# Patient Record
Sex: Male | Born: 1950
Health system: Southern US, Community
[De-identification: ages and names within clinical notes are randomized; demographics above are authoritative.]

## PROBLEM LIST (undated history)

## (undated) DIAGNOSIS — R600 Localized edema: Secondary | ICD-10-CM

## (undated) DIAGNOSIS — C8203 Follicular lymphoma grade I, intra-abdominal lymph nodes: Secondary | ICD-10-CM

## (undated) DIAGNOSIS — C8590 Non-Hodgkin lymphoma, unspecified, unspecified site: Secondary | ICD-10-CM

## (undated) DIAGNOSIS — C649 Malignant neoplasm of unspecified kidney, except renal pelvis: Secondary | ICD-10-CM

## (undated) DIAGNOSIS — R972 Elevated prostate specific antigen [PSA]: Secondary | ICD-10-CM

## (undated) DIAGNOSIS — C8293 Follicular lymphoma, unspecified, intra-abdominal lymph nodes: Secondary | ICD-10-CM

## (undated) DIAGNOSIS — I10 Essential (primary) hypertension: Secondary | ICD-10-CM

## (undated) DIAGNOSIS — Z8572 Personal history of non-Hodgkin lymphomas: Secondary | ICD-10-CM

## (undated) DIAGNOSIS — C7900 Secondary malignant neoplasm of unspecified kidney and renal pelvis: Secondary | ICD-10-CM

## (undated) HISTORY — DX: Non-Hodgkin lymphoma, unspecified, unspecified site: C85.90

## (undated) HISTORY — PX: NEPHRECTOMY: SHX65

## (undated) HISTORY — DX: Follicular lymphoma grade i, intra-abdominal lymph nodes: C82.03

## (undated) HISTORY — PX: VENTRAL HERNIA REPAIR: SHX424

## (undated) HISTORY — DX: Localized edema: R60.0

## (undated) HISTORY — DX: Malignant neoplasm of unspecified kidney, except renal pelvis: C64.9

## (undated) HISTORY — PX: HERNIA REPAIR: SHX51

## (undated) HISTORY — DX: Personal history of non-Hodgkin lymphomas: Z85.72

## (undated) HISTORY — PX: APPENDECTOMY: SHX54

## (undated) HISTORY — DX: Secondary malignant neoplasm of unspecified kidney and renal pelvis: C79.00

## (undated) HISTORY — DX: Follicular lymphoma, unspecified, intra-abdominal lymph nodes: C82.93

## (undated) HISTORY — DX: Essential (primary) hypertension: I10

## (undated) HISTORY — DX: Elevated prostate specific antigen (PSA): R97.20

## (undated) HISTORY — PX: SHOULDER SURGERY: SHX246

## (undated) HISTORY — PX: GROIN MASS OPEN BIOPSY: SHX1714

---

## 1999-11-07 ENCOUNTER — Emergency Department (HOSPITAL_COMMUNITY): Admission: EM | Admit: 1999-11-07 | Discharge: 1999-11-07 | Payer: Self-pay | Admitting: Emergency Medicine

## 1999-11-07 ENCOUNTER — Encounter: Payer: Self-pay | Admitting: Emergency Medicine

## 2000-09-13 ENCOUNTER — Ambulatory Visit (HOSPITAL_COMMUNITY): Admission: RE | Admit: 2000-09-13 | Discharge: 2000-09-13 | Payer: Self-pay | Admitting: *Deleted

## 2002-01-14 ENCOUNTER — Encounter: Payer: Self-pay | Admitting: Internal Medicine

## 2002-01-14 ENCOUNTER — Inpatient Hospital Stay (HOSPITAL_COMMUNITY): Admission: EM | Admit: 2002-01-14 | Discharge: 2002-01-15 | Payer: Self-pay | Admitting: Internal Medicine

## 2002-01-19 ENCOUNTER — Inpatient Hospital Stay (HOSPITAL_COMMUNITY): Admission: RE | Admit: 2002-01-19 | Discharge: 2002-01-21 | Payer: Self-pay | Admitting: *Deleted

## 2002-01-19 ENCOUNTER — Encounter (INDEPENDENT_AMBULATORY_CARE_PROVIDER_SITE_OTHER): Payer: Self-pay | Admitting: Specialist

## 2002-01-20 ENCOUNTER — Encounter: Payer: Self-pay | Admitting: Oncology

## 2002-01-21 ENCOUNTER — Encounter: Payer: Self-pay | Admitting: Oncology

## 2002-01-26 ENCOUNTER — Inpatient Hospital Stay (HOSPITAL_COMMUNITY): Admission: AD | Admit: 2002-01-26 | Discharge: 2002-01-30 | Payer: Self-pay | Admitting: Oncology

## 2002-02-05 ENCOUNTER — Encounter: Payer: Self-pay | Admitting: Oncology

## 2002-02-05 ENCOUNTER — Inpatient Hospital Stay (HOSPITAL_COMMUNITY): Admission: EM | Admit: 2002-02-05 | Discharge: 2002-02-13 | Payer: Self-pay | Admitting: Oncology

## 2002-02-06 ENCOUNTER — Encounter: Payer: Self-pay | Admitting: Oncology

## 2002-02-08 ENCOUNTER — Encounter: Payer: Self-pay | Admitting: Surgery

## 2002-02-08 ENCOUNTER — Encounter: Payer: Self-pay | Admitting: Oncology

## 2002-02-12 ENCOUNTER — Encounter: Payer: Self-pay | Admitting: Oncology

## 2002-03-25 ENCOUNTER — Ambulatory Visit (HOSPITAL_COMMUNITY): Admission: RE | Admit: 2002-03-25 | Discharge: 2002-03-25 | Payer: Self-pay | Admitting: Oncology

## 2002-03-25 ENCOUNTER — Encounter: Payer: Self-pay | Admitting: Oncology

## 2002-06-02 ENCOUNTER — Ambulatory Visit (HOSPITAL_COMMUNITY): Admission: RE | Admit: 2002-06-02 | Discharge: 2002-06-02 | Payer: Self-pay | Admitting: Oncology

## 2002-06-02 ENCOUNTER — Encounter: Payer: Self-pay | Admitting: Oncology

## 2002-06-03 ENCOUNTER — Encounter: Payer: Self-pay | Admitting: Oncology

## 2002-06-03 ENCOUNTER — Ambulatory Visit (HOSPITAL_COMMUNITY): Admission: RE | Admit: 2002-06-03 | Discharge: 2002-06-03 | Payer: Self-pay | Admitting: Oncology

## 2002-08-03 ENCOUNTER — Ambulatory Visit (HOSPITAL_COMMUNITY): Admission: RE | Admit: 2002-08-03 | Discharge: 2002-08-03 | Payer: Self-pay | Admitting: Oncology

## 2002-08-03 ENCOUNTER — Encounter: Payer: Self-pay | Admitting: Oncology

## 2002-10-22 ENCOUNTER — Encounter: Payer: Self-pay | Admitting: Oncology

## 2002-10-22 ENCOUNTER — Ambulatory Visit (HOSPITAL_COMMUNITY): Admission: RE | Admit: 2002-10-22 | Discharge: 2002-10-22 | Payer: Self-pay | Admitting: Oncology

## 2002-10-23 ENCOUNTER — Ambulatory Visit (HOSPITAL_COMMUNITY): Admission: RE | Admit: 2002-10-23 | Discharge: 2002-10-23 | Payer: Self-pay | Admitting: Oncology

## 2002-10-23 ENCOUNTER — Encounter: Payer: Self-pay | Admitting: Oncology

## 2003-02-05 ENCOUNTER — Ambulatory Visit (HOSPITAL_COMMUNITY): Admission: RE | Admit: 2003-02-05 | Discharge: 2003-02-05 | Payer: Self-pay | Admitting: Oncology

## 2003-02-05 ENCOUNTER — Encounter: Payer: Self-pay | Admitting: Oncology

## 2003-02-09 ENCOUNTER — Ambulatory Visit (HOSPITAL_COMMUNITY): Admission: RE | Admit: 2003-02-09 | Discharge: 2003-02-09 | Payer: Self-pay | Admitting: Oncology

## 2003-02-09 ENCOUNTER — Encounter: Payer: Self-pay | Admitting: Oncology

## 2003-03-01 DIAGNOSIS — C649 Malignant neoplasm of unspecified kidney, except renal pelvis: Secondary | ICD-10-CM

## 2003-03-01 DIAGNOSIS — Z85528 Personal history of other malignant neoplasm of kidney: Secondary | ICD-10-CM | POA: Insufficient documentation

## 2003-03-01 HISTORY — DX: Malignant neoplasm of unspecified kidney, except renal pelvis: C64.9

## 2003-03-12 ENCOUNTER — Inpatient Hospital Stay (HOSPITAL_COMMUNITY): Admission: RE | Admit: 2003-03-12 | Discharge: 2003-03-15 | Payer: Self-pay | Admitting: *Deleted

## 2003-03-12 ENCOUNTER — Encounter (INDEPENDENT_AMBULATORY_CARE_PROVIDER_SITE_OTHER): Payer: Self-pay | Admitting: *Deleted

## 2003-03-12 ENCOUNTER — Encounter: Payer: Self-pay | Admitting: Urology

## 2003-06-17 ENCOUNTER — Encounter: Payer: Self-pay | Admitting: Oncology

## 2003-06-17 ENCOUNTER — Ambulatory Visit (HOSPITAL_COMMUNITY): Admission: RE | Admit: 2003-06-17 | Discharge: 2003-06-17 | Payer: Self-pay | Admitting: Oncology

## 2003-06-22 ENCOUNTER — Encounter: Payer: Self-pay | Admitting: Oncology

## 2003-06-22 ENCOUNTER — Ambulatory Visit (HOSPITAL_COMMUNITY): Admission: RE | Admit: 2003-06-22 | Discharge: 2003-06-22 | Payer: Self-pay | Admitting: Oncology

## 2003-11-23 ENCOUNTER — Ambulatory Visit (HOSPITAL_COMMUNITY): Admission: RE | Admit: 2003-11-23 | Discharge: 2003-11-23 | Payer: Self-pay | Admitting: Oncology

## 2003-11-24 ENCOUNTER — Ambulatory Visit (HOSPITAL_COMMUNITY): Admission: RE | Admit: 2003-11-24 | Discharge: 2003-11-24 | Payer: Self-pay | Admitting: Oncology

## 2004-05-16 ENCOUNTER — Ambulatory Visit (HOSPITAL_COMMUNITY): Admission: RE | Admit: 2004-05-16 | Discharge: 2004-05-16 | Payer: Self-pay | Admitting: Oncology

## 2004-05-17 ENCOUNTER — Ambulatory Visit (HOSPITAL_COMMUNITY): Admission: RE | Admit: 2004-05-17 | Discharge: 2004-05-17 | Payer: Self-pay | Admitting: Oncology

## 2004-09-19 ENCOUNTER — Ambulatory Visit (HOSPITAL_COMMUNITY): Admission: RE | Admit: 2004-09-19 | Discharge: 2004-09-19 | Payer: Self-pay | Admitting: Oncology

## 2004-09-20 ENCOUNTER — Ambulatory Visit (HOSPITAL_COMMUNITY): Admission: RE | Admit: 2004-09-20 | Discharge: 2004-09-20 | Payer: Self-pay | Admitting: Oncology

## 2005-03-13 ENCOUNTER — Ambulatory Visit (HOSPITAL_COMMUNITY): Admission: RE | Admit: 2005-03-13 | Discharge: 2005-03-13 | Payer: Self-pay | Admitting: Family Medicine

## 2005-03-19 ENCOUNTER — Ambulatory Visit (HOSPITAL_COMMUNITY): Admission: RE | Admit: 2005-03-19 | Discharge: 2005-03-19 | Payer: Self-pay | Admitting: Oncology

## 2005-03-30 ENCOUNTER — Ambulatory Visit: Payer: Self-pay | Admitting: Oncology

## 2005-09-28 ENCOUNTER — Ambulatory Visit: Payer: Self-pay | Admitting: Oncology

## 2005-10-02 ENCOUNTER — Ambulatory Visit (HOSPITAL_COMMUNITY): Admission: RE | Admit: 2005-10-02 | Discharge: 2005-10-02 | Payer: Self-pay | Admitting: Oncology

## 2006-02-26 HISTORY — PX: US ECHOCARDIOGRAPHY: HXRAD669

## 2006-04-09 ENCOUNTER — Ambulatory Visit: Payer: Self-pay | Admitting: Oncology

## 2006-04-10 LAB — COMPREHENSIVE METABOLIC PANEL
AST: 18 U/L (ref 0–37)
Alkaline Phosphatase: 51 U/L (ref 39–117)
BUN: 20 mg/dL (ref 6–23)
Calcium: 9.3 mg/dL (ref 8.4–10.5)
Chloride: 104 mEq/L (ref 96–112)
Creatinine, Ser: 1.4 mg/dL (ref 0.4–1.5)

## 2006-04-10 LAB — CBC WITH DIFFERENTIAL/PLATELET
BASO%: 0.1 % (ref 0.0–2.0)
EOS%: 3.4 % (ref 0.0–7.0)
HCT: 45.7 % (ref 38.7–49.9)
LYMPH%: 16.7 % (ref 14.0–48.0)
MCH: 30.4 pg (ref 28.0–33.4)
MCHC: 34.4 g/dL (ref 32.0–35.9)
MCV: 88.3 fL (ref 81.6–98.0)
NEUT%: 75.4 % — ABNORMAL HIGH (ref 40.0–75.0)
Platelets: 180 10*3/uL (ref 145–400)

## 2006-04-15 ENCOUNTER — Ambulatory Visit (HOSPITAL_COMMUNITY): Admission: RE | Admit: 2006-04-15 | Discharge: 2006-04-15 | Payer: Self-pay | Admitting: Oncology

## 2006-10-09 ENCOUNTER — Ambulatory Visit (HOSPITAL_COMMUNITY): Admission: RE | Admit: 2006-10-09 | Discharge: 2006-10-09 | Payer: Self-pay | Admitting: Oncology

## 2006-10-14 ENCOUNTER — Ambulatory Visit: Payer: Self-pay | Admitting: Oncology

## 2006-11-27 ENCOUNTER — Ambulatory Visit (HOSPITAL_BASED_OUTPATIENT_CLINIC_OR_DEPARTMENT_OTHER): Admission: RE | Admit: 2006-11-27 | Discharge: 2006-11-27 | Payer: Self-pay | Admitting: *Deleted

## 2006-12-26 ENCOUNTER — Ambulatory Visit: Payer: Self-pay | Admitting: Oncology

## 2006-12-31 DIAGNOSIS — C8203 Follicular lymphoma grade I, intra-abdominal lymph nodes: Secondary | ICD-10-CM

## 2006-12-31 HISTORY — DX: Follicular lymphoma grade i, intra-abdominal lymph nodes: C82.03

## 2007-01-01 LAB — CBC WITH DIFFERENTIAL/PLATELET
Basophils Absolute: 0 10*3/uL (ref 0.0–0.1)
EOS%: 1.7 % (ref 0.0–7.0)
HCT: 44.9 % (ref 38.7–49.9)
HGB: 15.1 g/dL (ref 13.0–17.1)
LYMPH%: 10 % — ABNORMAL LOW (ref 14.0–48.0)
MCH: 29.8 pg (ref 28.0–33.4)
MCV: 88.3 fL (ref 81.6–98.0)
MONO%: 4.4 % (ref 0.0–13.0)
NEUT%: 83.7 % — ABNORMAL HIGH (ref 40.0–75.0)

## 2007-01-02 LAB — COMPREHENSIVE METABOLIC PANEL
Alkaline Phosphatase: 68 U/L (ref 39–117)
BUN: 16 mg/dL (ref 6–23)
Glucose, Bld: 128 mg/dL — ABNORMAL HIGH (ref 70–99)
Total Bilirubin: 0.7 mg/dL (ref 0.3–1.2)

## 2007-01-02 LAB — LACTATE DEHYDROGENASE: LDH: 178 U/L (ref 94–250)

## 2007-01-03 ENCOUNTER — Ambulatory Visit (HOSPITAL_COMMUNITY): Admission: RE | Admit: 2007-01-03 | Discharge: 2007-01-03 | Payer: Self-pay | Admitting: Oncology

## 2007-04-25 ENCOUNTER — Ambulatory Visit: Payer: Self-pay | Admitting: Oncology

## 2007-04-30 LAB — CBC WITH DIFFERENTIAL/PLATELET
BASO%: 0.4 % (ref 0.0–2.0)
Basophils Absolute: 0 10*3/uL (ref 0.0–0.1)
HCT: 43.4 % (ref 38.7–49.9)
HGB: 15.4 g/dL (ref 13.0–17.1)
MCHC: 35.5 g/dL (ref 32.0–35.9)
MONO#: 0.3 10*3/uL (ref 0.1–0.9)
NEUT%: 76.2 % — ABNORMAL HIGH (ref 40.0–75.0)
RDW: 13.2 % (ref 11.2–14.6)
WBC: 5.9 10*3/uL (ref 4.0–10.0)
lymph#: 0.9 10*3/uL (ref 0.9–3.3)

## 2007-04-30 LAB — ERYTHROCYTE SEDIMENTATION RATE: Sed Rate: 2 mm/hr (ref 0–20)

## 2007-05-01 LAB — COMPREHENSIVE METABOLIC PANEL
ALT: 16 U/L (ref 0–53)
AST: 18 U/L (ref 0–37)
Calcium: 9.3 mg/dL (ref 8.4–10.5)
Chloride: 104 mEq/L (ref 96–112)
Creatinine, Ser: 1.11 mg/dL (ref 0.40–1.50)
Total Bilirubin: 0.5 mg/dL (ref 0.3–1.2)

## 2007-05-01 LAB — BETA 2 MICROGLOBULIN, SERUM: Beta-2 Microglobulin: 1.5 mg/L (ref 1.01–1.73)

## 2007-05-07 ENCOUNTER — Ambulatory Visit (HOSPITAL_COMMUNITY): Admission: RE | Admit: 2007-05-07 | Discharge: 2007-05-07 | Payer: Self-pay | Admitting: Oncology

## 2007-12-03 ENCOUNTER — Ambulatory Visit: Payer: Self-pay | Admitting: Oncology

## 2007-12-03 LAB — SEDIMENTATION RATE: Sed Rate: 2 mm/hr (ref 0–16)

## 2007-12-03 LAB — CBC WITH DIFFERENTIAL/PLATELET
Basophils Absolute: 0.1 10*3/uL (ref 0.0–0.1)
EOS%: 1.8 % (ref 0.0–7.0)
HCT: 44.1 % (ref 38.7–49.9)
HGB: 15.6 g/dL (ref 13.0–17.1)
LYMPH%: 14.4 % (ref 14.0–48.0)
MCH: 31 pg (ref 28.0–33.4)
MCV: 87.4 fL (ref 81.6–98.0)
MONO%: 4.4 % (ref 0.0–13.0)
NEUT%: 78.7 % — ABNORMAL HIGH (ref 40.0–75.0)

## 2007-12-04 ENCOUNTER — Ambulatory Visit (HOSPITAL_COMMUNITY): Admission: RE | Admit: 2007-12-04 | Discharge: 2007-12-04 | Payer: Self-pay | Admitting: Oncology

## 2007-12-04 LAB — COMPREHENSIVE METABOLIC PANEL
ALT: 31 U/L (ref 0–53)
AST: 26 U/L (ref 0–37)
BUN: 21 mg/dL (ref 6–23)
Calcium: 9.7 mg/dL (ref 8.4–10.5)
Chloride: 102 mEq/L (ref 96–112)
Creatinine, Ser: 1.22 mg/dL (ref 0.40–1.50)
Total Bilirubin: 0.7 mg/dL (ref 0.3–1.2)

## 2007-12-04 LAB — LACTATE DEHYDROGENASE: LDH: 192 U/L (ref 94–250)

## 2008-06-30 ENCOUNTER — Ambulatory Visit: Payer: Self-pay | Admitting: Oncology

## 2008-07-16 LAB — CBC WITH DIFFERENTIAL/PLATELET
BASO%: 0.3 % (ref 0.0–2.0)
HCT: 42.8 % (ref 38.7–49.9)
MCHC: 34.8 g/dL (ref 32.0–35.9)
MONO#: 0.3 10*3/uL (ref 0.1–0.9)
NEUT#: 5 10*3/uL (ref 1.5–6.5)
RBC: 4.91 10*6/uL (ref 4.20–5.71)
WBC: 6.2 10*3/uL (ref 4.0–10.0)
lymph#: 0.8 10*3/uL — ABNORMAL LOW (ref 0.9–3.3)

## 2008-07-19 LAB — COMPREHENSIVE METABOLIC PANEL
ALT: 15 U/L (ref 0–53)
AST: 18 U/L (ref 0–37)
Albumin: 4.5 g/dL (ref 3.5–5.2)
Calcium: 9.1 mg/dL (ref 8.4–10.5)
Chloride: 105 mEq/L (ref 96–112)
Potassium: 3.6 mEq/L (ref 3.5–5.3)
Total Protein: 6.2 g/dL (ref 6.0–8.3)

## 2008-07-19 LAB — BETA 2 MICROGLOBULIN, SERUM: Beta-2 Microglobulin: 1.52 mg/L (ref 1.01–1.73)

## 2008-07-20 ENCOUNTER — Ambulatory Visit (HOSPITAL_COMMUNITY): Admission: RE | Admit: 2008-07-20 | Discharge: 2008-07-20 | Payer: Self-pay | Admitting: Oncology

## 2008-10-11 ENCOUNTER — Encounter: Admission: RE | Admit: 2008-10-11 | Discharge: 2008-10-11 | Payer: Self-pay | Admitting: *Deleted

## 2008-10-19 HISTORY — PX: CARDIOVASCULAR STRESS TEST: SHX262

## 2008-11-05 ENCOUNTER — Ambulatory Visit: Payer: Self-pay | Admitting: Oncology

## 2008-11-09 LAB — COMPREHENSIVE METABOLIC PANEL
Albumin: 4.1 g/dL (ref 3.5–5.2)
BUN: 25 mg/dL — ABNORMAL HIGH (ref 6–23)
CO2: 24 mEq/L (ref 19–32)
Glucose, Bld: 97 mg/dL (ref 70–99)
Sodium: 140 mEq/L (ref 135–145)
Total Bilirubin: 0.5 mg/dL (ref 0.3–1.2)
Total Protein: 6.2 g/dL (ref 6.0–8.3)

## 2008-11-09 LAB — CBC WITH DIFFERENTIAL/PLATELET
Basophils Absolute: 0 10*3/uL (ref 0.0–0.1)
Eosinophils Absolute: 0.1 10*3/uL (ref 0.0–0.5)
HCT: 42.2 % (ref 38.7–49.9)
HGB: 14.7 g/dL (ref 13.0–17.1)
LYMPH%: 12.1 % — ABNORMAL LOW (ref 14.0–48.0)
MCV: 89 fL (ref 81.6–98.0)
MONO#: 0.4 10*3/uL (ref 0.1–0.9)
NEUT#: 4.8 10*3/uL (ref 1.5–6.5)
Platelets: 160 10*3/uL (ref 145–400)
RBC: 4.74 10*6/uL (ref 4.20–5.71)
WBC: 6.1 10*3/uL (ref 4.0–10.0)

## 2008-11-09 LAB — LACTATE DEHYDROGENASE: LDH: 147 U/L (ref 94–250)

## 2008-11-23 ENCOUNTER — Ambulatory Visit (HOSPITAL_COMMUNITY): Admission: RE | Admit: 2008-11-23 | Discharge: 2008-11-23 | Payer: Self-pay | Admitting: Oncology

## 2008-12-29 ENCOUNTER — Ambulatory Visit: Payer: Self-pay | Admitting: Oncology

## 2009-01-03 LAB — COMPREHENSIVE METABOLIC PANEL
ALT: 35 U/L (ref 0–53)
CO2: 27 mEq/L (ref 19–32)
Calcium: 9.1 mg/dL (ref 8.4–10.5)
Chloride: 103 mEq/L (ref 96–112)
Creatinine, Ser: 1.23 mg/dL (ref 0.40–1.50)
Glucose, Bld: 92 mg/dL (ref 70–99)
Total Bilirubin: 0.5 mg/dL (ref 0.3–1.2)
Total Protein: 6.2 g/dL (ref 6.0–8.3)

## 2009-01-03 LAB — LACTATE DEHYDROGENASE: LDH: 178 U/L (ref 94–250)

## 2009-01-03 LAB — CBC WITH DIFFERENTIAL/PLATELET
BASO%: 0.2 % (ref 0.0–2.0)
HCT: 44.4 % (ref 38.7–49.9)
MCHC: 34.3 g/dL (ref 32.0–35.9)
MONO#: 0.3 10*3/uL (ref 0.1–0.9)
NEUT#: 5.1 10*3/uL (ref 1.5–6.5)
NEUT%: 80.2 % — ABNORMAL HIGH (ref 40.0–75.0)
WBC: 6.4 10*3/uL (ref 4.0–10.0)
lymph#: 0.8 10*3/uL — ABNORMAL LOW (ref 0.9–3.3)

## 2009-01-03 LAB — MORPHOLOGY: RBC Comments: NORMAL

## 2009-01-03 LAB — URIC ACID: Uric Acid, Serum: 8.1 mg/dL — ABNORMAL HIGH (ref 4.0–7.8)

## 2009-05-06 ENCOUNTER — Ambulatory Visit: Payer: Self-pay | Admitting: Oncology

## 2009-05-10 ENCOUNTER — Ambulatory Visit (HOSPITAL_COMMUNITY): Admission: RE | Admit: 2009-05-10 | Discharge: 2009-05-10 | Payer: Self-pay | Admitting: Oncology

## 2009-05-10 LAB — CBC WITH DIFFERENTIAL/PLATELET
Eosinophils Absolute: 0.1 10*3/uL (ref 0.0–0.5)
LYMPH%: 14.9 % (ref 14.0–49.0)
MCH: 30.6 pg (ref 27.2–33.4)
MCHC: 34.5 g/dL (ref 32.0–36.0)
MCV: 88.8 fL (ref 79.3–98.0)
MONO%: 4.9 % (ref 0.0–14.0)
NEUT#: 4.7 10*3/uL (ref 1.5–6.5)
Platelets: 161 10*3/uL (ref 140–400)
RBC: 5.07 10*6/uL (ref 4.20–5.82)

## 2009-05-10 LAB — COMPREHENSIVE METABOLIC PANEL
AST: 26 U/L (ref 0–37)
Alkaline Phosphatase: 48 U/L (ref 39–117)
BUN: 14 mg/dL (ref 6–23)
Calcium: 9.3 mg/dL (ref 8.4–10.5)
Creatinine, Ser: 1.2 mg/dL (ref 0.40–1.50)
Total Bilirubin: 0.9 mg/dL (ref 0.3–1.2)

## 2009-05-10 LAB — MORPHOLOGY

## 2009-05-11 LAB — SEDIMENTATION RATE: Sed Rate: 2 mm/hr (ref 0–16)

## 2009-11-18 ENCOUNTER — Ambulatory Visit: Payer: Self-pay | Admitting: Oncology

## 2009-11-22 ENCOUNTER — Ambulatory Visit (HOSPITAL_COMMUNITY): Admission: RE | Admit: 2009-11-22 | Discharge: 2009-11-22 | Payer: Self-pay | Admitting: Oncology

## 2009-11-22 LAB — COMPREHENSIVE METABOLIC PANEL
ALT: 22 U/L (ref 0–53)
AST: 24 U/L (ref 0–37)
Albumin: 3.9 g/dL (ref 3.5–5.2)
Calcium: 9.2 mg/dL (ref 8.4–10.5)
Chloride: 102 mEq/L (ref 96–112)
Potassium: 3.5 mEq/L (ref 3.5–5.3)
Sodium: 141 mEq/L (ref 135–145)
Total Protein: 6.5 g/dL (ref 6.0–8.3)

## 2009-11-22 LAB — CBC WITH DIFFERENTIAL/PLATELET
Basophils Absolute: 0 10*3/uL (ref 0.0–0.1)
Eosinophils Absolute: 0.2 10*3/uL (ref 0.0–0.5)
HCT: 45.7 % (ref 38.4–49.9)
HGB: 15.7 g/dL (ref 13.0–17.1)
NEUT#: 5 10*3/uL (ref 1.5–6.5)
NEUT%: 82.1 % — ABNORMAL HIGH (ref 39.0–75.0)
RDW: 13.1 % (ref 11.0–14.6)
lymph#: 0.8 10*3/uL — ABNORMAL LOW (ref 0.9–3.3)

## 2009-11-22 LAB — MORPHOLOGY: PLT EST: ADEQUATE

## 2009-12-05 ENCOUNTER — Ambulatory Visit (HOSPITAL_COMMUNITY): Admission: RE | Admit: 2009-12-05 | Discharge: 2009-12-05 | Payer: Self-pay | Admitting: Urology

## 2010-01-06 ENCOUNTER — Inpatient Hospital Stay (HOSPITAL_COMMUNITY): Admission: RE | Admit: 2010-01-06 | Discharge: 2010-01-08 | Payer: Self-pay | Admitting: Urology

## 2010-01-06 ENCOUNTER — Encounter (INDEPENDENT_AMBULATORY_CARE_PROVIDER_SITE_OTHER): Payer: Self-pay | Admitting: Urology

## 2010-01-06 DIAGNOSIS — C7900 Secondary malignant neoplasm of unspecified kidney and renal pelvis: Secondary | ICD-10-CM

## 2010-01-06 HISTORY — DX: Secondary malignant neoplasm of unspecified kidney and renal pelvis: C79.00

## 2010-08-08 ENCOUNTER — Ambulatory Visit: Payer: Self-pay | Admitting: Cardiovascular Disease

## 2010-11-03 ENCOUNTER — Ambulatory Visit: Payer: Self-pay | Admitting: Oncology

## 2010-11-07 ENCOUNTER — Ambulatory Visit (HOSPITAL_COMMUNITY): Admission: RE | Admit: 2010-11-07 | Discharge: 2010-11-07 | Payer: Self-pay | Admitting: Oncology

## 2010-11-07 LAB — CBC WITH DIFFERENTIAL/PLATELET
BASO%: 0.3 % (ref 0.0–2.0)
Basophils Absolute: 0 10*3/uL (ref 0.0–0.1)
EOS%: 2.5 % (ref 0.0–7.0)
HGB: 16.2 g/dL (ref 13.0–17.1)
MCH: 30.6 pg (ref 27.2–33.4)
MCV: 88.8 fL (ref 79.3–98.0)
MONO%: 5 % (ref 0.0–14.0)
RBC: 5.3 10*6/uL (ref 4.20–5.82)
RDW: 13.4 % (ref 11.0–14.6)
lymph#: 0.9 10*3/uL (ref 0.9–3.3)

## 2010-11-07 LAB — COMPREHENSIVE METABOLIC PANEL
AST: 26 U/L (ref 0–37)
Albumin: 3.8 g/dL (ref 3.5–5.2)
Alkaline Phosphatase: 65 U/L (ref 39–117)
BUN: 13 mg/dL (ref 6–23)
Creatinine, Ser: 1.29 mg/dL (ref 0.40–1.50)
Glucose, Bld: 100 mg/dL — ABNORMAL HIGH (ref 70–99)
Total Bilirubin: 0.7 mg/dL (ref 0.3–1.2)

## 2010-11-07 LAB — MORPHOLOGY: RBC Comments: NORMAL

## 2010-11-08 LAB — SEDIMENTATION RATE: Sed Rate: 2 mm/h (ref 0–16)

## 2011-01-11 ENCOUNTER — Ambulatory Visit (HOSPITAL_COMMUNITY)
Admission: RE | Admit: 2011-01-11 | Discharge: 2011-01-11 | Payer: Self-pay | Source: Home / Self Care | Attending: Orthopedic Surgery | Admitting: Orthopedic Surgery

## 2011-01-20 ENCOUNTER — Other Ambulatory Visit: Payer: Self-pay | Admitting: Oncology

## 2011-01-20 ENCOUNTER — Encounter: Payer: Self-pay | Admitting: Oncology

## 2011-01-20 DIAGNOSIS — C8590 Non-Hodgkin lymphoma, unspecified, unspecified site: Secondary | ICD-10-CM

## 2011-01-21 ENCOUNTER — Encounter: Payer: Self-pay | Admitting: Oncology

## 2011-03-18 LAB — COMPREHENSIVE METABOLIC PANEL
ALT: 29 U/L (ref 0–53)
AST: 27 U/L (ref 0–37)
Albumin: 4 g/dL (ref 3.5–5.2)
Alkaline Phosphatase: 66 U/L (ref 39–117)
BUN: 13 mg/dL (ref 6–23)
CO2: 32 mEq/L (ref 19–32)
Calcium: 9.2 mg/dL (ref 8.4–10.5)
Chloride: 101 mEq/L (ref 96–112)
Creatinine, Ser: 1.1 mg/dL (ref 0.4–1.5)
GFR calc Af Amer: >60 mL/min (ref >60)
GFR calc non Af Amer: >60 mL/min (ref >60)
Glucose, Bld: 107 mg/dL — ABNORMAL HIGH (ref 70–99)
Potassium: 3.6 mEq/L (ref 3.5–5.1)
Sodium: 141 mEq/L (ref 135–145)
Total Bilirubin: 0.8 mg/dL (ref 0.3–1.2)
Total Protein: 6.7 g/dL (ref 6.0–8.3)

## 2011-03-18 LAB — TYPE AND SCREEN
ABO/RH(D): O NEG
Antibody Screen: NEGATIVE

## 2011-03-18 LAB — CBC
HCT: 47.3 % (ref 39.0–52.0)
Hemoglobin: 15.9 g/dL (ref 13.0–17.0)
MCHC: 33.7 g/dL (ref 30.0–36.0)
MCV: 89.6 fL (ref 78.0–100.0)
Platelets: 173 10*3/uL (ref 150–400)
RBC: 5.28 MIL/uL (ref 4.22–5.81)
RDW: 13.6 % (ref 11.5–15.5)
WBC: 5.6 10*3/uL (ref 4.0–10.5)

## 2011-03-18 LAB — PROTIME-INR
INR: 0.91 (ref 0.00–1.49)
Prothrombin Time: 12.2 seconds (ref 11.6–15.2)

## 2011-03-18 LAB — APTT: aPTT: 28 seconds (ref 24–37)

## 2011-03-18 LAB — ABO/RH: ABO/RH(D): O NEG

## 2011-04-05 ENCOUNTER — Other Ambulatory Visit: Payer: Self-pay | Admitting: Cardiovascular Disease

## 2011-04-05 DIAGNOSIS — I10 Essential (primary) hypertension: Secondary | ICD-10-CM

## 2011-04-05 NOTE — Telephone Encounter (Signed)
Fax received from pharmacy. Jodette Akeylah Hendel RN  

## 2011-04-26 ENCOUNTER — Encounter: Payer: Self-pay | Admitting: Family Medicine

## 2011-05-18 NOTE — H&P (Signed)
Brian Parker, Brian Parker                            ACCOUNT NO.:  [id number]   MEDICAL RECORD NO.:  [id number]                   PATIENT TYPE:  INP   LOCATION:  0346                                 FACILITY:  [facility]   PHYSICIAN:  Mark C. Vernie Ammons, M.D.               DATE OF BIRTH:  07-Jan-1950   DATE OF ADMISSION:  03/12/2003  DATE OF DISCHARGE:                                HISTORY & PHYSICAL   HISTORY OF PRESENT ILLNESS:  The patient is a 61 year old white male who was  seen in office consultation recently after Dr. Luan Pulling had obtained a CT  scan of the abdomen and pelvis.  This study revealed an area in the right  kidney that appeared to have changed from previous CT scan.  The patient had  complained of feeling like he had pulled a muscle in his right flank but  does a lot of physical labor.  He also has a history of a non-Hodgkin  lymphoma and because of that has been getting serial CT scans since its  diagnosis.  That has allowed continued observation of his kidneys and upon  review of these scans there was a small cyst seen in his right kidney.  This  was confirmed by ultrasound but was noted to be in the medial aspect of the  kidney.  That was done in October of last year.  A CT scan done on February 09, 2003 clearly demonstrated what appeared to be a solid mass projecting  off the posterior aspect of the right kidney measuring 1.5 x 1.4 cm.  The  kidney itself appeared to have a somewhat thin parenchyma and a normal  contralateral kidney that appeared to be slightly increased in size with  good function.  In retrospect there was a subtle change to his kidney in his  October scan but not enough to be noticeable unless one was to review the  most recent scan which has demonstrated definite enlargement.  I confirmed  the solid nature of the lesion and its presence by an ultrasound in my  office at that time.  The patient is now admitted for elective right partial  nephrectomy and  also he is to undergo repair of a ventral hernia under the  same anesthetic.   PAST MEDICAL HISTORY:  1. Hypertension.  2. Gastroesophageal reflux disease.  3. Non-Hodgkin lymphoma.   PAST SURGICAL HISTORY:  1. Shoulder surgery in 1970.  2. Appendectomy in 1995.  3. Abdominal lymph node biopsy in 2003.  4. Required exploration last year for a bowel obstruction.   CURRENT MEDICATIONS:  1. Norvasc 5 mg.  2. Flomax 20 mg daily.  3. Allopurinol 10 mg a day.   ALLERGIES:  NKDA.   SOCIAL HISTORY:  He denies tobacco use and occasionally drinks alcohol.   FAMILY HISTORY:  Father is 100, mother is 40, and there is hypertension in  the family.   REVIEW OF SYSTEMS:  Reveals no bone pain or unexplained weight loss.  He has  nocturia one time at night and sometimes twice.  Otherwise no irritative or  obstructive voiding symptoms.  No gross hematuria has been noted.  His most  recent CT scan revealed no evidence of recurrence of his lymphoma.   PHYSICAL EXAMINATION:  VITAL SIGNS:  Temperature 98, pulse 56 and regular,  respirations 18, blood pressure 120/80.  GENERAL:  The patient is a well-developed, well-nourished white male in no  apparent distress.  HEENT:  Atraumatic, normocephalic.  Oropharynx clear.  NECK:  Supple without mass or JVD.  LUNGS:  Clear to auscultation.  CARDIOVASCULAR:  Bradycardic rhythm but regular.  No murmurs.  ABDOMEN:  Soft and nontender.  There is a midline abdominal incisional  hernia defect approximately 10 cm in the area of the umbilicus.  It is  nontender and reducible.  No hepatosplenomegaly is appreciated.  LYMPHATICS:  The inguinal regions reveal no adenopathy.  He has a right  inguinal bulge that is a little bit larger than on the left hand side.   How about just cancelling all of this, thanks.                                               Mark C. Vernie Ammons, M.D.    MCO/MEDQ  D:  03/12/2003  T:  03/12/2003  Job:  [id number]   cc:   Vikki Ports, M.D.  [address]  [place]  [place] [address]  Fax: [phone]   Pierce Crane, M.D.  [address] [facility]  [place]  [place] [address]  Fax: [phone]   Maryla Morrow. Modesto Charon, M.D.  [address]  [place]  [place] [address]  Fax: [phone]

## 2011-05-18 NOTE — Op Note (Signed)
NAMEMAXSON, ODDO NO.:  [id number]   MEDICAL RECORD NO.:  [id number]          PATIENT TYPE:  AMB   LOCATION:  NESC                         FACILITY:  [facility]   PHYSICIAN:  Alfonse Ras, MD   DATE OF BIRTH:  [birth year]   DATE OF PROCEDURE:  11/27/2006  DATE OF DISCHARGE:                               OPERATIVE REPORT   PREOPERATIVE DIAGNOSIS:  Bilateral inguinal hernias, history of  lymphoma, history of ventral hernia, history of renal cancer.   POSTOPERATIVE DIAGNOSIS:  Bilateral inguinal hernias, history of  lymphoma, history of ventral hernia, history of renal cancer.   PROCEDURES:  Bilateral open inguinal hernia repairs with mesh.   SURGEON:  Alfonse Ras, MD   ANESTHESIA:  General.   DESCRIPTION:  The patient was taken to the operating room, placed in  supine position.  After adequate anesthesia was induced using  endotracheal tube, the lower abdomen and perineum were prepped and  draped in normal sterile fashion.  Starting on the right side I made an  oblique incision extending from the anterior superior iliac spine down  toward the pubic tubercle.  I dissected down sharply and bluntly down to  the external oblique fascia.  This was opened along its fibers.  Ilioinguinal nerve was identified and retracted superiorly.  Dissection  was taken down to Cooper's ligament and shelving edge of the inguinal  ligament and down to the transversalis fascia.  Spermatic cord was  identified and grasped with the Penrose drain down near the external  ring.  A direct hernia defect was identified and reduced into the  abdominal cavity.  Primary closure was performed in a tension-free  manner using interrupted #1 Novofil approximating transversalis fascia  to the shelving edge of the inguinal ligament and down to Cooper's  ligament.  This was brought up to the internal ring.  A piece of onlay  Bard polypropylene mesh was then placed over the repair and tacked  using  a running 2-0 Prolene suture from the pubic tubercle and out lateral to  the internal ring.  Adequate hemostasis was assured.  The muscles were  injected using 0.5 Marcaine.  The external oblique fascia was closed  with a running 3-0 Vicryl suture.  Skin incision was closed with  staples.   I then turned my attention to the left side.  Again an oblique incision  was made in similar fashion and dissected down the external oblique  fascia using Bovie electrocautery.  This was opened along its fibers.  Ilioinguinal nerve was identified and retracted superiorly.  Spermatic  cord was surrounded with Penrose drain at the external ring.  Direct  hernia defect which was smaller than it was on the right was reduced  into the abdominal cavity.  The floor of Hesselbach's triangle was then  closed with interrupted 0 Surgilon sutures.  Again a piece of  polypropylene Bard  mesh was placed over the repair and tacked with a running 2-0 Prolene  suture and brought out lateral to the internal ring.  The tissues were  injected with 0.5  Marcaine.  The fascia was closed with running 3-0  Vicryl.  The skin incision was closed with staples.  The patient  tolerated the procedure well and went to PACU in good condition.      Alfonse Ras, MD  Electronically Signed     KRE/MEDQ  D:  11/27/2006  T:  11/27/2006  Job:  [id number]

## 2011-05-18 NOTE — Discharge Summary (Signed)
NAMEBARTLETT, ENKE                            ACCOUNT NO.:  [id number]   MEDICAL RECORD NO.:  [id number]                   PATIENT TYPE:  INP   LOCATION:  0346                                 FACILITY:  [facility]   PHYSICIAN:  Mark C. Vernie Ammons, M.D.               DATE OF BIRTH:  [birth year]   DATE OF ADMISSION:  03/12/2003  DATE OF DISCHARGE:  03/15/2003                                 DISCHARGE SUMMARY   ADMISSION DIAGNOSES:  1. Right renal mass.  2. Ventral hernia.   POSTOPERATIVE DIAGNOSES:  1. Right renal mass.  2. Ventral hernia.   PROCEDURES:  1. Right partial nephrectomy.  2. Laparoscopic ventral hernia repair.   HISTORY AND PHYSICAL:  For full details please see admission History and  Physical.  Briefly, the patient is a 61 year old white male with a ventral  hernia status post previous abdominal surgery.  The patient also has  recently been found to have a small right renal mass that was enhancing and  consistent with malignancy and measuring approximately 1-2 cm in diameter.  After urologic and general surgical evaluation, it was decided to proceed  with ventral hernia repair as well as right partial nephrectomy.   HOSPITAL COURSE:  The patient was taken to the operating room on March 12, 2003 and underwent the above procedures.  He tolerated these well and  without complication.  Postoperatively, the patient was able to be  transferred to a regular hospital room following recovery from anesthesia.  The patient was gradually able to resume ambulating without difficulty.  His  diet was gradually advanced as his bowel function returned.  The patient's  pain medication was gradually able to be changed to oral medications once  his diet was advanced.  By postoperative day #3, the patient was tolerating  a regular diet and was ambulating without difficulty.  He had had a bowel  movement and was able to be discharged home.  His wounds looked good without  evidence of  infection throughout his hospital course.   DISPOSITION:  Home.   DISCHARGE MEDICATIONS:  The patient was instructed to resume his regular  home medications.  In addition, he was given a prescription for Tylox to  take as needed for pain.   DISCHARGE INSTRUCTIONS:  The patient was instructed to resume his diet as  before the surgery.  In addition, he was instructed to be ambulatory.  However, he was instructed to subsequently refrain from any heavy lifting,  strenuous activity, or driving.    FOLLOW UP:  An appointment was made for the patient to follow up in  approximately one week for removal of his staples.     Crecencio Mc, M.D.                          Veverly Fells. Vernie Ammons, M.D.    LB/MEDQ  D:  03/16/2003  T:  03/17/2003  Job:  [id number]

## 2011-05-18 NOTE — Consult Note (Signed)
[place]  Patient:    JUDEA, RICHES Visit Number: [id number] MRN: [id number]          Service Type: MED Location: [phone] Attending Physician:  Miguel Aschoff Dictated by:   Lonna Cobb, N.P. Proc. Date: 01/15/02 Admit Date:  01/14/2002 Discharge Date: 01/15/2002   CC:         Redmond Baseman, M.D.  Miguel Aschoff, M.D.  Catalina Lunger, M.D.   Consultation Report  REASON FOR CONSULTATION:  Retroperitoneal and mesenteric masses.  REFERRING PHYSICIAN:  Miguel Aschoff, M.D.  HISTORY OF PRESENT ILLNESS:  Mr. Zenon is a 61 year old man with a history of hypertension who presented to his primary care [facility] on January 14, 2002, with complaints of an approximately two-week history of abdominal pain and more recent "hard" area in his left abdominal region.  He reports the pain is worse at night.  Abdominal/pelvic CT is at [facility] on January 14, 2002, showed large retroperitoneal masses in periaortic and pericaval regions with displacement of the right kidney laterally and encasement of the renal vessels, as well as a large mass involving the mesentery.  The patient has been evaluated by Catalina Lunger, M.D. with plans for laparotomy and biopsy next week.  PAST MEDICAL HISTORY: 1. Hypertension. 2. Cardiac catheterization approximately 18 months ago which was negative. 3. Status post appendectomy in 1995. 4. Status post left shoulder surgery in 1969.  MEDICATIONS:  Toprol XL 25 mg daily.  HOME MEDICATIONS:  Monopril.  ALLERGIES:  No known drug allergies.  FAMILY HISTORY:  Mother is living and has a history of cervical cancer for which she was treated with radiation.  She also has a history of IBS.  Father has a history of hypertension, diabetes mellitus, and an MI.  Sister who has hypertension.  One brother and one sister who are both healthy.  SOCIAL HISTORY:  Mr. Vanessen lives in [place]  with his wife.  They have two children ages 64 and 5 who are both healthy.  They own a blind/drapery company. He has no history of EtOH or tobacco use.  REVIEW OF SYSTEMS:  The patient denies any weight loss or anorexia.  He has had no fever or nightsweats.  He denies any fatigue.  He does report an approximately two-week history of abdominal pain/pressure worse at night.  He denies any unusual headaches or vision changes.  He has had no mouth sores. He denies any enlarged lymph nodes.  He has had no shortness of breath.  He has had a recent cough which he attributes to a cold.  He denies any hemoptysis. He has had no chest pain.  He denies any peripheral edema.  He has had no recent change in his bowel habits and denies any rectal bleeding.  He has had no nausea and vomiting.  He has been experiencing intermittent "indigestion" since October of 2002.  He denies any hematuria or dysuria.  No testicular pain or masses.  He denies any skin changes or rashes.  He denies any falls or balance problems.  PHYSICAL EXAMINATION:  VITAL SIGNS:  Temperature 98.7, heart rate 60, respirations 18, blood pressure 133/86, oxygen saturation 95% on room air.  GENERAL:  Well-nourished, Caucasian male in no acute distress.  HEENT:  Normocephalic, atraumatic.  Pupils are equal, round and reactive to light; extraocular movements are intact.  Sclerae anicteric.  Oropharynx is clear; there are no ulcers or lesions in the oral cavity.  LYMPHS:  No palpable cervical, supraclavicular, axillary, or inguinal lymph nodes.  CHEST:  Lungs clear bilaterally.  HEART:  Regular rate and rhythm.  ABDOMEN:  Palpable mass in the left midabdominal region.  EXTREMITIES:  No clubbing, cyanosis, or edema.  NEUROLOGICAL:  Alert and oriented x 3.  Motor strength is 5/5.  DTRs are 2+ and symmetrical.  GENITOURINARY:  No testicular masses.  LABORATORY DATA:  Hemoglobin 14.2, white count 10.3, platelets 213,000. Sodium  142, potassium 3.8, BUN 12, creatinine 1.4, glucose 91, calcium 8.1, total bilirubin 1.0, alkaline phosphatase 65, SGOT 25, SGPT 22, total protein 6.7, albumin 3.8.  PTT 27, PT 13.2.  LDH 150, ESR 1, HIV negative, hepatitis B negative.  RADIOLOGY:  Abdominal CT; large retroperitoneal masses in periaortic and pericaval regions.  Displacement of the right kidney laterally and virtual encasement of renal vessels by retroperitoneal masses.  There is also a large mass involving the mesentery measuring approximately 12 cm.  Pelvic CT; bilateral iliac adenopathy.  IMPRESSION:  Mr. Bigley is a 61 year old previously healthy man who presented with complaints of mild abdominal discomfort and was found to have large retroperitoneal/mesenteric masses.  He has had no other symptoms and specifically denies any nightsweats, fevers, or weight loss.  Examination is notable for a large palpable approximately 15 to 20 cm mass in the left abdominal region with no peripheral adenopathy.  We agree with the biopsy scheduled for next week.  Differential diagnosis includes lymphoma, retroperitoneal sarcoma, and germcell cancer.  PLAN:  We will complete staging with CT scan of the chest, CAT scan, bone marrow biopsy, and a MUGA scan.  The patient is seen and examined by Dr. Donnie Coffin; x-rays were reviewed. Dictated by:   Lonna Cobb, N.P. Attending Physician:  Miguel Aschoff DD:  01/16/02 TD:  01/18/02 Job: [id number]

## 2011-08-13 ENCOUNTER — Other Ambulatory Visit: Payer: Self-pay | Admitting: *Deleted

## 2011-08-13 MED ORDER — HYDROCHLOROTHIAZIDE 25 MG PO TABS: 25.0000 mg | ORAL_TABLET | Freq: Every day | ORAL | Status: DC

## 2011-08-13 NOTE — Telephone Encounter (Signed)
Fax received from pharmacy. Refill completed. Informed needs yearly, will call back and make app.Alfonso Ramus RN

## 2011-09-28 LAB — GLUCOSE, CAPILLARY: Glucose-Capillary: 93

## 2011-11-07 ENCOUNTER — Encounter: Payer: Self-pay | Admitting: Cardiovascular Disease

## 2011-11-09 ENCOUNTER — Encounter: Payer: Self-pay | Admitting: Cardiovascular Disease

## 2011-11-09 ENCOUNTER — Ambulatory Visit (INDEPENDENT_AMBULATORY_CARE_PROVIDER_SITE_OTHER): Payer: BC Managed Care – PPO | Admitting: Cardiovascular Disease

## 2011-11-09 VITALS — BP 156/93 | HR 61 | Ht 70.0 in | Wt 210.4 lb

## 2011-11-09 DIAGNOSIS — I10 Essential (primary) hypertension: Secondary | ICD-10-CM

## 2011-11-09 LAB — LIPID PANEL
LDL Cholesterol: 98 mg/dL (ref 0–99)
Total CHOL/HDL Ratio: 3
Triglycerides: 79 mg/dL (ref 0.0–149.0)

## 2011-11-09 LAB — HEPATIC FUNCTION PANEL
AST: 22 U/L (ref 0–37)
Albumin: 4.2 g/dL (ref 3.5–5.2)
Alkaline Phosphatase: 52 U/L (ref 39–117)
Total Bilirubin: 1 mg/dL (ref 0.3–1.2)

## 2011-11-09 LAB — BASIC METABOLIC PANEL
CO2: 26 mEq/L (ref 19–32)
Calcium: 9.2 mg/dL (ref 8.4–10.5)
Chloride: 103 mEq/L (ref 96–112)
Creatinine, Ser: 1.1 mg/dL (ref 0.4–1.5)
Glucose, Bld: 89 mg/dL (ref 70–99)

## 2011-11-09 MED ORDER — DOXAZOSIN MESYLATE 4 MG PO TABS: 4.0000 mg | ORAL_TABLET | Freq: Every day | ORAL | Status: DC

## 2011-11-09 NOTE — Patient Instructions (Signed)
Your physician wants you to follow-up in: 3 months  You will receive a reminder letter in the mail two months in advance. If you don't receive a letter, please call our office to schedule the follow-up appointment.   Your physician recommends that you return for a FASTING lipid profile: TODAY   Your physician has recommended you make the following change in your medication:   1) START  Cardura 4mg  daily

## 2011-11-09 NOTE — Assessment & Plan Note (Signed)
His blood pressure remains elevated. We will add Cardura 4 mg a day. I'll see him again in 3 months for followup visit. Otherwise he seems to be doing very well.  I have congratulated him on his weight loss.

## 2011-11-09 NOTE — Progress Notes (Signed)
Brian Parker Date of Birth  08/05/1950 [facility] [address]    [address] [place], [place]  [address] [phone]  Fax  [phone]  History of Present Illness:  Brian Parker is a 61 y.o. gentleman with a hx of HTN, non-Hodgkins lymphoma, s/p nephrectomy.  He has lost about 40 pounds over the past 10 months.  His BP has been consistently mildly elevated.  He has not been eating any extra salt.      Current Outpatient Prescriptions on File Prior to Visit  Medication Sig Dispense Refill  . amLODipine (NORVASC) 5 MG tablet TAKE 1 TABLET BY MOUTH EVERY DAY  30 tablet  PRN  . hydrochlorothiazide 25 MG tablet Take 1 tablet (25 mg total) by mouth daily.  30 tablet  2  . Multiple Vitamin (MULTIVITAMIN) tablet Take 1 tablet by mouth daily.        . potassium chloride (K-DUR) 10 MEQ tablet Take 10 mEq by mouth daily.          Allergies  Allergen Reactions  . Losartan     Past Medical History  Diagnosis Date  . Non Hodgkin's lymphoma   . Hypertension   . Chest pain     INTERMITTENT  . Edema of lower extremity     Past Surgical History  Procedure Date  . Nephrectomy   . Appendectomy   . Shoulder surgery   . US echocardiography 02/26/2006    EF 55-60%  . Cardiovascular stress test 10/19/2008    EF 52%, NO ISCHEMIA    History  Smoking status  . Never Smoker   Smokeless tobacco  . Not on file    History  Alcohol Use No    Family History  Problem Relation Age of Onset  . Uterine cancer Mother   . Coronary artery disease Father   . Hypertension Father     Reviw of Systems:  Reviewed in the HPI.  All other systems are negative.  Physical Exam: BP 156/93  Pulse 61  Ht 5\' 10"  (1.778 m)  Wt 210 lb 6.4 oz (95.437 kg)  BMI 30.19 kg/m2 The patient is alert and oriented x 3.  The mood and affect are normal.   Skin: warm and dry.  Color is normal.    HEENT:   Normocephalic/atraumatic. The carotids are normal. No JVD  Lungs: clear   Heart: RR, no murmurs     Abdomen: + BS, non tender  Extremities:  No edema, no cords  Neuro:  Non focal, gait is normal    ECG: Sinus bradycardia. Otherwise the ECG is normal  Assessment / Plan:

## 2011-11-13 ENCOUNTER — Ambulatory Visit (HOSPITAL_COMMUNITY)
Admission: RE | Admit: 2011-11-13 | Discharge: 2011-11-13 | Disposition: A | Discharge status: Home / Self Care | Payer: BC Managed Care – PPO | Source: Ambulatory Visit | Attending: Oncology | Admitting: Oncology

## 2011-11-13 ENCOUNTER — Other Ambulatory Visit: Payer: Self-pay | Admitting: Oncology

## 2011-11-13 ENCOUNTER — Other Ambulatory Visit (HOSPITAL_BASED_OUTPATIENT_CLINIC_OR_DEPARTMENT_OTHER): Payer: BC Managed Care – PPO | Admitting: Lab

## 2011-11-13 ENCOUNTER — Other Ambulatory Visit (HOSPITAL_COMMUNITY): Payer: Self-pay

## 2011-11-13 DIAGNOSIS — Z905 Acquired absence of kidney: Secondary | ICD-10-CM | POA: Insufficient documentation

## 2011-11-13 DIAGNOSIS — K7689 Other specified diseases of liver: Secondary | ICD-10-CM | POA: Insufficient documentation

## 2011-11-13 DIAGNOSIS — R599 Enlarged lymph nodes, unspecified: Secondary | ICD-10-CM | POA: Insufficient documentation

## 2011-11-13 DIAGNOSIS — C8590 Non-Hodgkin lymphoma, unspecified, unspecified site: Secondary | ICD-10-CM

## 2011-11-13 DIAGNOSIS — K5730 Diverticulosis of large intestine without perforation or abscess without bleeding: Secondary | ICD-10-CM | POA: Insufficient documentation

## 2011-11-13 DIAGNOSIS — C8589 Other specified types of non-Hodgkin lymphoma, extranodal and solid organ sites: Secondary | ICD-10-CM | POA: Insufficient documentation

## 2011-11-13 LAB — CBC WITH DIFFERENTIAL/PLATELET
BASO%: 0.2 % (ref 0.0–2.0)
EOS%: 3.6 % (ref 0.0–7.0)
Eosinophils Absolute: 0.2 10*3/uL (ref 0.0–0.5)
HGB: 15.4 g/dL (ref 13.0–17.1)
MCV: 88.4 fL (ref 79.3–98.0)
MONO%: 6.1 % (ref 0.0–14.0)
RBC: 5.19 10*6/uL (ref 4.20–5.82)
RDW: 13.1 % (ref 11.0–14.6)
WBC: 5.6 10*3/uL (ref 4.0–10.3)

## 2011-11-13 LAB — MORPHOLOGY: RBC Comments: NORMAL

## 2011-11-13 LAB — COMPREHENSIVE METABOLIC PANEL
ALT: 17 U/L (ref 0–53)
Alkaline Phosphatase: 60 U/L (ref 39–117)
CO2: 31 mEq/L (ref 19–32)
Creatinine, Ser: 1.13 mg/dL (ref 0.50–1.35)
Glucose, Bld: 95 mg/dL (ref 70–99)
Sodium: 140 mEq/L (ref 135–145)
Total Bilirubin: 0.5 mg/dL (ref 0.3–1.2)

## 2011-11-13 LAB — LACTATE DEHYDROGENASE: LDH: 161 U/L (ref 94–250)

## 2011-11-13 LAB — SEDIMENTATION RATE: Sed Rate: 1 mm/hr (ref 0–16)

## 2011-11-20 ENCOUNTER — Ambulatory Visit (HOSPITAL_BASED_OUTPATIENT_CLINIC_OR_DEPARTMENT_OTHER): Payer: BC Managed Care – PPO | Admitting: Oncology

## 2011-11-20 ENCOUNTER — Ambulatory Visit: Payer: BC Managed Care – PPO

## 2011-11-20 ENCOUNTER — Encounter: Payer: Self-pay | Admitting: Oncology

## 2011-11-20 ENCOUNTER — Telehealth: Payer: Self-pay | Admitting: Oncology

## 2011-11-20 VITALS — BP 154/98 | HR 74 | Temp 96.9°F | Ht 70.0 in | Wt 212.7 lb

## 2011-11-20 DIAGNOSIS — C649 Malignant neoplasm of unspecified kidney, except renal pelvis: Secondary | ICD-10-CM

## 2011-11-20 DIAGNOSIS — C8203 Follicular lymphoma grade I, intra-abdominal lymph nodes: Secondary | ICD-10-CM

## 2011-11-20 DIAGNOSIS — F411 Generalized anxiety disorder: Secondary | ICD-10-CM

## 2011-11-20 DIAGNOSIS — R972 Elevated prostate specific antigen [PSA]: Secondary | ICD-10-CM

## 2011-11-20 DIAGNOSIS — C8589 Other specified types of non-Hodgkin lymphoma, extranodal and solid organ sites: Secondary | ICD-10-CM

## 2011-11-20 DIAGNOSIS — C7900 Secondary malignant neoplasm of unspecified kidney and renal pelvis: Secondary | ICD-10-CM

## 2011-11-20 HISTORY — DX: Elevated prostate specific antigen (PSA): R97.20

## 2011-11-20 LAB — LACTATE DEHYDROGENASE: LDH: 160 U/L (ref 94–250)

## 2011-11-20 NOTE — Progress Notes (Signed)
[facility]  HEMATOLOGY/MEDICAL ONCOLOGY [address] [place], [place]  [address] Phone:  [phone] Fax:  [phone]   OFFICE PROGRESS NOTE  NAME:  Brian Parker, Brian Parker MRN:  [id number] DATE:  11/14/2010 DOB:  06/07/1950  CC: Lisa Miller, MD  Mark C. Ottelin, MD  Amber Allen, MD  Khyree Rizzieri, MD  INTERIM HISTORY:   Follow-up visit for this now 60- year-old man with history of low-grade B- cell non-Hodgkin's lymphoma and metachronous cancers of the right kidney, now status post nephrectomy.    Overall he is doing well.  He continues to work full time.  He has a number of chronic aches and pains, some discomfort along the right flank from his previous nephrectomy.  Some suprapubic discomfort relieved by having a bowel movement.  No change in his bowel habit.  No hematochezia, melena or constipation.  He has had bilateral inguinal hernia repairs and has mesh in place and wonders whether this could be causing some irritation?  He is evaluated both by his urologist about 6 weeks ago and by his primary care physician.  He has chronic elevation of his PSA but his prostate exam was stable.  His internist looked back at his old colonoscopy record from 2 years ago.  She felt that perhaps he has some diverticulosis.  He did have some improvement when he was treated with an empiric course of antibiotics.    Appetite is good and weight is stable.    PHYSICAL EXAMINATION:   Blood pressure 152/109 which he attributes to being anxious due to today's visit.  Weight is 238 pounds, up from 231 last year.  The head and neck are normal. Lungs are clear and resonant to percussion.  Regular cardiac rhythm, no murmur.  No cervical, supraclavicular, axillary or inguinal lymphadenopathy.  Abdomen is soft, nontender, no mass, no organomegaly.  Currently no suprapubic tenderness.  Extremities:  No edema, no calf tenderness.  Neurologic is grossly normal.   LABORATORY DATA:   Hemoglobin 16, white count 5,700; neutrophils 4300, platelets 174,000.  Chem profile including LDH normal.  BUN 13, creatinine 1.3 in a solitary left kidney.    CT scan of the chest, abdomen and pelvis done 11/07/10 which I personally reviewed with Mr. Fels shows a small amount of irregular soft tissue density surrounding the aorta which is unchanged compared with scans done 1 and 2 years ago.  I went back and looked at a 2008 study and the soft tissue was not present on that study but is unchanged from 2009 until now.  There is no new adenopathy.  The left kidney appears normal.  Spleen normal.  No pelvic pathology.  Bladder appears normal.  No signs of obstruction.  Status post right nephrectomy.    IMPRESSION: 1. Follicular grade 1 B-cell non-Hodgkin's lymphoma presenting with initial bulky mesenteric and periaortic lymphadenopathy in January of 2008, treated with 8 cycles of CHOP/Rituxan through July 2003.  Maintenance Rituxan x 2 cycles subsequently stopped due to delayed neutropenia.  Suspicion for early progression on CT scan 07/27/08 with single area of soft tissue density in the left periaortic area, 2.6 x 0.9 cm.  This area has been persistent but unchanged on serial scan since that time, and there are no new findings.  PLAN:  Continue annual follow-up exam and CT.  2. Metachronous primary right kidney cancers, initial 1.4 cm T1a lesion status post partial nephrectomy March 2004.  Second primary 1.3 cm complex lesion, status post right   nephrectomy 01/06/10 with findings of a 1.5 cm grade 3 of 4 renal cell carcinoma with invasion into the renal capsule but no vascular invasion.   3. History of chronically elevated PSA in the range of 6 units.  Repetitive biopsies to date negative for cancer.  Ongoing follow up with his urologist.   4. Essential hypertension.  Currently on diuretic alone.   5. Lower abdominal/ suprapubic discomfort.  Not clear whether or not this is related to the mesh placed for  bilateral inguinal hernia repair in the past or whether he just had an episode of prostatitis since he did get some improvement in his symptoms on antibiotics.  Really no clinical suspicion that he has diverticulitis.      Electronically signed James  Granfortuna, M.D. Accutype [id number]   11/20/11: Followup visit for this 61-year-old man with history of low-grade B-cell non-Hodgkin's lymphoma and metachronous primary cancers of the right kidney ultimately status post right nephrectomy. Brian Parker has adopted a healthy lifestyle since his visit here last year. He stopped eating all junk food. He is exercising on a regular basis. He has lost about 40 pounds. He tells me he feels better than he has felt in a long time. He denies any constitutional symptoms. No fevers no night sweats. Review of systems is entirely unremarkable.  Unfortunately his CT scan done in anticipation of today's visit on 11/13/2011 which have personally reviewed with him does show regrowth of a previously stable area of retroperitoneal lymphadenopathy. This was not a contrast study and the delineation between the aorta and the lymph nodes is not very clear. In addition the area of adenopathy in circles the vessel it is difficult to measure exactly. Radiologist is calling at 4.9 x 3.4 cm compared with 3.5 x 2.4 on the study done 1 year ago. No other areas of suspicious or recurrent adenopathy in the chest abdomen or pelvis. No splenomegaly. Despite difficulty getting exact measurements there is clearly a change compared with the study last year.  Physical exam is unrevealing except for a chronic 2-3 cm left axillary solitary lymph node. No other areas of adenopathy in the neck supraclavicular right axillary or inguinal regions. No splenomegaly.  Laboratory studies are unremarkable with a hemoglobin of 16.2 normal chemistry profile and LDH and an ESR of 1 mm.  Impression:  #1. Local progression of B-cell non-Hodgkin's lymphoma  in the area of previous bulk disease.  No clinical evidence for a Richter's type conversion.  Plan: I would like to get a PET scan. I think that if the PET scan corroborates local progression I would refer him for a radiation therapy. I would use chemotherapy only if significant activity outside the obvious disease on CT scan.  #2. At metachronous primary cancers of the right kidney now status post right nephrectomy  #3. Chronic elevation of PSA with previous negative biopsies.  #4. Essential hypertension.  #5. Chronic anxiety and depression. He seemed to take today's news in stride. I reassured him that we have a number of treatment options that can get his disease under control again.   

## 2011-11-20 NOTE — Telephone Encounter (Signed)
gve the pt his dec 2012 appt calendar along with the pet scan appt.

## 2011-11-26 ENCOUNTER — Telehealth: Payer: Self-pay | Admitting: *Deleted

## 2011-11-26 NOTE — Telephone Encounter (Signed)
Called pt. To let him know his LDH from 11/20/11 was normal.  He appreciated the call

## 2011-11-30 ENCOUNTER — Encounter (HOSPITAL_COMMUNITY)
Admission: RE | Admit: 2011-11-30 | Discharge: 2011-11-30 | Disposition: A | Discharge status: Home / Self Care | Payer: BC Managed Care – PPO | Source: Ambulatory Visit | Attending: Oncology | Admitting: Oncology

## 2011-11-30 DIAGNOSIS — C8203 Follicular lymphoma grade I, intra-abdominal lymph nodes: Secondary | ICD-10-CM

## 2011-11-30 DIAGNOSIS — C8293 Follicular lymphoma, unspecified, intra-abdominal lymph nodes: Secondary | ICD-10-CM | POA: Insufficient documentation

## 2011-11-30 LAB — GLUCOSE, CAPILLARY: Glucose-Capillary: 102 mg/dL — ABNORMAL HIGH (ref 70–99)

## 2011-11-30 MED ORDER — FLUDEOXYGLUCOSE F - 18 (FDG) INJECTION
19.9000 | Freq: Once | INTRAVENOUS | Status: AC | PRN
  Administered 2011-11-30: 19.9 via INTRAVENOUS

## 2011-12-05 ENCOUNTER — Ambulatory Visit: Payer: BC Managed Care – PPO | Admitting: Oncology

## 2011-12-05 ENCOUNTER — Telehealth: Payer: Self-pay | Admitting: *Deleted

## 2011-12-05 NOTE — Telephone Encounter (Signed)
Received vm call from pt. This am @ 0856am asking about appt. today,stating that he had talked with Dr. Evie Lacks & doesn't think he needs appt today  but just checking to be sure.   Returned call to pt. & reported that appt. cancelled for today & will r/s after bx.

## 2011-12-06 NOTE — Progress Notes (Signed)
Appointment rescheduled.

## 2011-12-10 ENCOUNTER — Encounter (HOSPITAL_COMMUNITY): Payer: Self-pay | Admitting: Pharmacy Technician

## 2011-12-10 ENCOUNTER — Other Ambulatory Visit: Payer: Self-pay | Admitting: Physician Assistant

## 2011-12-11 ENCOUNTER — Other Ambulatory Visit: Payer: Self-pay | Admitting: Diagnostic Radiology

## 2011-12-11 ENCOUNTER — Encounter (HOSPITAL_COMMUNITY): Payer: Self-pay

## 2011-12-11 ENCOUNTER — Ambulatory Visit (HOSPITAL_COMMUNITY)
Admission: RE | Admit: 2011-12-11 | Discharge: 2011-12-11 | Disposition: A | Discharge status: Home / Self Care | Payer: BC Managed Care – PPO | Source: Ambulatory Visit | Attending: Oncology | Admitting: Oncology

## 2011-12-11 DIAGNOSIS — R599 Enlarged lymph nodes, unspecified: Secondary | ICD-10-CM | POA: Insufficient documentation

## 2011-12-11 DIAGNOSIS — C8589 Other specified types of non-Hodgkin lymphoma, extranodal and solid organ sites: Secondary | ICD-10-CM | POA: Insufficient documentation

## 2011-12-11 DIAGNOSIS — C8203 Follicular lymphoma grade I, intra-abdominal lymph nodes: Secondary | ICD-10-CM

## 2011-12-11 LAB — BASIC METABOLIC PANEL
Chloride: 104 mEq/L (ref 96–112)
GFR calc Af Amer: >90 mL/min (ref >90)
GFR calc non Af Amer: 79 mL/min — ABNORMAL LOW (ref >90)
Potassium: 3.6 mEq/L (ref 3.5–5.1)
Sodium: 140 mEq/L (ref 135–145)

## 2011-12-11 LAB — CBC
Hemoglobin: 14.7 g/dL (ref 13.0–17.0)
Platelets: 153 10*3/uL (ref 150–400)
RBC: 4.89 MIL/uL (ref 4.22–5.81)
WBC: 5.8 10*3/uL (ref 4.0–10.5)

## 2011-12-11 LAB — PROTIME-INR
INR: 0.96 (ref 0.00–1.49)
Prothrombin Time: 13 seconds (ref 11.6–15.2)

## 2011-12-11 MED ORDER — FENTANYL CITRATE 0.05 MG/ML IJ SOLN
INTRAMUSCULAR | Status: AC | PRN
  Administered 2011-12-11: 100 ug via INTRAVENOUS

## 2011-12-11 MED ORDER — SODIUM CHLORIDE 0.9 % IV SOLN
INTRAVENOUS | Status: DC
  Administered 2011-12-11: 500 mL via INTRAVENOUS

## 2011-12-11 MED ORDER — MIDAZOLAM HCL 5 MG/5ML IJ SOLN
INTRAMUSCULAR | Status: AC | PRN
  Administered 2011-12-11: 2 mg via INTRAVENOUS

## 2011-12-11 MED ORDER — SODIUM CHLORIDE 0.9 % IV SOLN: INTRAVENOUS | Status: DC

## 2011-12-11 NOTE — Procedures (Signed)
CT guided core biopsies of left para-aortic lymph nodes.   5 - 18 gauge core biopsies.  No immediate complication.

## 2011-12-11 NOTE — H&P (View-Only) (Signed)
[facility]  HEMATOLOGY/MEDICAL ONCOLOGY [address] [place], [place]  [address] Phone:  [phone] Fax:  [phone]   OFFICE PROGRESS NOTE  NAME:  JAKEL, ALPHIN MRN:  [id number] DATE:  11/14/2010 DOB:  1950-02-10  CC: Sigmund Hazel, MD  Veverly Fells. Vernie Ammons, MD  Bertram Savin, MD  Rhea Pink, MD  INTERIM HISTORY:   Follow-up visit for this now 61- year-old man with history of low-grade B- cell non-Hodgkin's lymphoma and metachronous cancers of the right kidney, now status post nephrectomy.    Overall he is doing well.  He continues to work full time.  He has a number of chronic aches and pains, some discomfort along the right flank from his previous nephrectomy.  Some suprapubic discomfort relieved by having a bowel movement.  No change in his bowel habit.  No hematochezia, melena or constipation.  He has had bilateral inguinal hernia repairs and has mesh in place and wonders whether this could be causing some irritation?  He is evaluated both by his urologist about 6 weeks ago and by his primary care physician.  He has chronic elevation of his PSA but his prostate exam was stable.  His internist looked back at his old colonoscopy record from 2 years ago.  She felt that perhaps he has some diverticulosis.  He did have some improvement when he was treated with an empiric course of antibiotics.    Appetite is good and weight is stable.    PHYSICAL EXAMINATION:   Blood pressure 152/109 which he attributes to being anxious due to today's visit.  Weight is 238 pounds, up from 231 last year.  The head and neck are normal. Lungs are clear and resonant to percussion.  Regular cardiac rhythm, no murmur.  No cervical, supraclavicular, axillary or inguinal lymphadenopathy.  Abdomen is soft, nontender, no mass, no organomegaly.  Currently no suprapubic tenderness.  Extremities:  No edema, no calf tenderness.  Neurologic is grossly normal.   LABORATORY DATA:   Hemoglobin 16, white count 5,700; neutrophils 4300, platelets 174,000.  Chem profile including LDH normal.  BUN 13, creatinine 1.3 in a solitary left kidney.    CT scan of the chest, abdomen and pelvis done 11/07/10 which I personally reviewed with Mr. Dastrup shows a small amount of irregular soft tissue density surrounding the aorta which is unchanged compared with scans done 1 and 2 years ago.  I went back and looked at a 2008 study and the soft tissue was not present on that study but is unchanged from 2009 until now.  There is no new adenopathy.  The left kidney appears normal.  Spleen normal.  No pelvic pathology.  Bladder appears normal.  No signs of obstruction.  Status post right nephrectomy.    IMPRESSION: 1. Follicular grade 1 B-cell non-Hodgkin's lymphoma presenting with initial bulky mesenteric and periaortic lymphadenopathy in January of 2008, treated with 8 cycles of CHOP/Rituxan through July 2003.  Maintenance Rituxan x 2 cycles subsequently stopped due to delayed neutropenia.  Suspicion for early progression on CT scan 07/27/08 with single area of soft tissue density in the left periaortic area, 2.6 x 0.9 cm.  This area has been persistent but unchanged on serial scan since that time, and there are no new findings.  PLAN:  Continue annual follow-up exam and CT.  2. Metachronous primary right kidney cancers, initial 1.4 cm T1a lesion status post partial nephrectomy March 2004.  Second primary 1.3 cm complex lesion, status post right  nephrectomy 01/06/10 with findings of a 1.5 cm grade 3 of 4 renal cell carcinoma with invasion into the renal capsule but no vascular invasion.   3. History of chronically elevated PSA in the range of 6 units.  Repetitive biopsies to date negative for cancer.  Ongoing follow up with his urologist.   4. Essential hypertension.  Currently on diuretic alone.   5. Lower abdominal/ suprapubic discomfort.  Not clear whether or not this is related to the mesh placed for  bilateral inguinal hernia repair in the past or whether he just had an episode of prostatitis since he did get some improvement in his symptoms on antibiotics.  Really no clinical suspicion that he has diverticulitis.      Electronically signed Cephas Darby, M.D. Accutype [id number]   11/20/11: Followup visit for this 61 year old man with history of low-grade B-cell non-Hodgkin's lymphoma and metachronous primary cancers of the right kidney ultimately status post right nephrectomy. Avyn has adopted a healthy lifestyle since his visit here last year. He stopped eating all junk food. He is exercising on a regular basis. He has lost about 40 pounds. He tells me he feels better than he has felt in a long time. He denies any constitutional symptoms. No fevers no night sweats. Review of systems is entirely unremarkable.  Unfortunately his CT scan done in anticipation of today's visit on 11/13/2011 which have personally reviewed with him does show regrowth of a previously stable area of retroperitoneal lymphadenopathy. This was not a contrast study and the delineation between the aorta and the lymph nodes is not very clear. In addition the area of adenopathy in circles the vessel it is difficult to measure exactly. Radiologist is calling at 4.9 x 3.4 cm compared with 3.5 x 2.4 on the study done 1 year ago. No other areas of suspicious or recurrent adenopathy in the chest abdomen or pelvis. No splenomegaly. Despite difficulty getting exact measurements there is clearly a change compared with the study last year.  Physical exam is unrevealing except for a chronic 2-3 cm left axillary solitary lymph node. No other areas of adenopathy in the neck supraclavicular right axillary or inguinal regions. No splenomegaly.  Laboratory studies are unremarkable with a hemoglobin of 16.2 normal chemistry profile and LDH and an ESR of 1 mm.  Impression:  #1. Local progression of B-cell non-Hodgkin's lymphoma  in the area of previous bulk disease.  No clinical evidence for a Richter's type conversion.  Plan: I would like to get a PET scan. I think that if the PET scan corroborates local progression I would refer him for a radiation therapy. I would use chemotherapy only if significant activity outside the obvious disease on CT scan.  #2. At metachronous primary cancers of the right kidney now status post right nephrectomy  #3. Chronic elevation of PSA with previous negative biopsies.  #4. Essential hypertension.  #5. Chronic anxiety and depression. He seemed to take today's news in stride. I reassured him that we have a number of treatment options that can get his disease under control again.

## 2011-12-11 NOTE — ED Notes (Signed)
Patient is resting comfortably. 

## 2011-12-11 NOTE — Interval H&P Note (Cosign Needed)
History and Physical Interval Note: History reviewed and compared with note from Dr. Cyndie Chime.  No significant changes in history, physical exam, ROS or meds at this time.  He presents today for a biopsy of retroperitoneal lymph node to evaluate for possible recurrence of lymphoma.  12/11/2011 1:15 PM  Brian Parker  has presented today for surgery, with the diagnosis of *retroperitoneal adenopathy with history of lymphoma. *  The various methods of treatment have been discussed with the patient and family. After consideration of risks, benefits and other options for treatment, the patient has consented to needle core biopsy of retroperitoneal lymph node as a surgical intervention for diagnosis .  The patients' history has been reviewed, patient examined, no change in status, stable for surgery.  I have reviewed the patients' chart and labs.  Questions were answered to the patient's satisfaction.     Arisbel Maione D, PA-C

## 2011-12-17 ENCOUNTER — Other Ambulatory Visit: Payer: Self-pay | Admitting: Oncology

## 2011-12-17 ENCOUNTER — Telehealth: Payer: Self-pay

## 2011-12-17 NOTE — Telephone Encounter (Signed)
Received f/u phone call from pt stating that he has already r/s his appt with Dr Dayton Scrape for 1/3.  Pt is comfortable waiting until after the holidays & states that "Dr Cyndie Chime said we didn't have to be in a big hurry."    dph

## 2011-12-17 NOTE — Telephone Encounter (Signed)
Received call from pt stating he got a call from Radiation Oncology with an appt with Dr Dayton Scrape tomorrow.  Per pt, he is out of town and will not be able to make appt.  Was offered another appt later in the week, but not with Dr Dayton Scrape.  Pt's question - Does Dr Cyndie Chime want pt to see Dr Dayton Scrape specifically or are any of the XRT doctors ok?  Note to Dr Cyndie Chime. dph

## 2011-12-19 ENCOUNTER — Encounter: Payer: Self-pay | Admitting: *Deleted

## 2011-12-19 NOTE — Progress Notes (Unsigned)
Copy of 12/11/11 surgical path report sent to HIM to fax to Dr. Nehemiah Settle.

## 2011-12-20 ENCOUNTER — Encounter: Payer: Self-pay | Admitting: Oncology

## 2011-12-20 ENCOUNTER — Other Ambulatory Visit: Payer: Self-pay | Admitting: Oncology

## 2011-12-20 DIAGNOSIS — C8293 Follicular lymphoma, unspecified, intra-abdominal lymph nodes: Secondary | ICD-10-CM

## 2011-12-20 HISTORY — DX: Follicular lymphoma, unspecified, intra-abdominal lymph nodes: C82.93

## 2012-01-02 ENCOUNTER — Encounter: Payer: Self-pay | Admitting: Radiation Oncology

## 2012-01-02 NOTE — Progress Notes (Signed)
62 year old male in excellent health until January 2003 when he was diagnosed with follicular grade 1 B-cell Non-Hodgkin's lymphoma. 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 He continues to work full time. He has two daughters.  CT scan done 11/13/2011 revealed regrowth of previously stable area of retroperitoneal lymphadenopathy. Patient presents for new consultation with Dr. Dayton Scrape reference treatment with radiation thearpy because 11/30/2011 PET done by Dr. Cyndie Chime corroborates only local progression.   Intolerance of Losartan because it causes stomach cramps otherwise NKDA. No HX of XRT in the past.  Chronic anxiety and depression noted by Dr. Cyndie Chime No indications of pacemaker

## 2012-01-03 ENCOUNTER — Ambulatory Visit
Admission: RE | Admit: 2012-01-03 | Discharge: 2012-01-03 | Disposition: A | Discharge status: Home / Self Care | Payer: BC Managed Care – PPO | Source: Ambulatory Visit | Attending: Radiation Oncology | Admitting: Radiation Oncology

## 2012-01-03 ENCOUNTER — Encounter: Payer: Self-pay | Admitting: Radiation Oncology

## 2012-01-03 VITALS — BP 155/85 | HR 72 | Resp 18 | Ht 70.0 in | Wt 225.0 lb

## 2012-01-03 DIAGNOSIS — Z510 Encounter for antineoplastic radiation therapy: Secondary | ICD-10-CM | POA: Insufficient documentation

## 2012-01-03 DIAGNOSIS — C8293 Follicular lymphoma, unspecified, intra-abdominal lymph nodes: Secondary | ICD-10-CM | POA: Insufficient documentation

## 2012-01-03 DIAGNOSIS — Z905 Acquired absence of kidney: Secondary | ICD-10-CM | POA: Insufficient documentation

## 2012-01-03 DIAGNOSIS — I10 Essential (primary) hypertension: Secondary | ICD-10-CM | POA: Insufficient documentation

## 2012-01-03 DIAGNOSIS — Z79899 Other long term (current) drug therapy: Secondary | ICD-10-CM | POA: Insufficient documentation

## 2012-01-03 DIAGNOSIS — Z85528 Personal history of other malignant neoplasm of kidney: Secondary | ICD-10-CM | POA: Insufficient documentation

## 2012-01-03 NOTE — Progress Notes (Signed)
Please see the Nurse Progress Note in the MD Initial Consult Encounter for this patient. 

## 2012-01-03 NOTE — Progress Notes (Signed)
Patient presents to the clinic today accompanied by his wife for a consultation with Dr. Dayton Scrape. Patient is alert and oriented to person, place, and time. No distress noted. Steady gait noted. Pleasant affect noted. Patient denies pain. Patient has no complaints at this time. Patient reports his last chemotherapy was back in 2003.

## 2012-01-03 NOTE — Progress Notes (Signed)
Completed PATIENT MEASURE OF DISTRESS worksheet turned into social work with a score of 0. Also, completed NUTRITION RISK SCREEN worksheet turned into Brian Parker, RD.

## 2012-01-03 NOTE — Progress Notes (Signed)
[facility] Radiation Oncology NEW PATIENT EVALUATION  Name: Brian Parker MRN: [id number]  Date: 01/03/2012  DOB: 1950/08/21  Status: outpatient   CC: Neldon Labella, MD, MD  Levert Feinstein, MD    REFERRING PHYSICIAN: Levert Feinstein, MD   DIAGNOSIS: The encounter diagnosis was Nodular lymphoma involving intra-abdominal lymph nodes.     HISTORY OF PRESENT ILLNESS:  Brian Parker is a 62 y.o. male who is seen today for the courtesy of Dr. Cyndie Chime for consideration of radiation therapy in the management of his progressive low-grade non-Hodgkin's lymphoma. He has a history of follicular grade 1 B-cell non-Hodgkin's lymphoma diagnosed back in 2003 treated with 8 cycles of CHOP/Rituxan through July 2003. He did with bulky mesenteric and periauricular for adenopathy in 2008. Along the way he presented with metachronous primary right kidney cancers treated by right nephrectomy. These were both early stage and confined to the kidney. He's had numerous CT scans and PET scans over the years. His last CT scan on 11/13/2011 shows regrowth of the previously stable area of retroperitoneal adenopathy. A PET scan on 11/30/2011 showed markedly positive uptake with SUV Max of 10.3 along his retroperitoneum. There were no other areas of uptake. A CT-guided biopsy on 12/11/2011 was again consistent with non-Hodgkin's B cell lymphoma, low-grade. Dr. Cyndie Chime inquires about involved field radiation therapy to hopefully place him in remission. He is without B. symptoms. He has had an intentional 40 pound weight loss of the past year. He dropped from 250 pounds to 210 pounds. He denies night sweats, or fevers.     PREVIOUS RADIATION THERAPY: No   PAST MEDICAL HISTORY:  has a past medical history of Non Hodgkin's lymphoma; Hypertension; Edema of lower extremity; Follicular lymphoma grade I of intra-abdominal lymph nodes (12/31/2006); Elevated prostate specific antigen (PSA) (11/20/2011);  Nodular lymphoma involving intra-abdominal lymph nodes (12/20/2011); Kidney carcinoma (03/01/2003); Cancer of kidney, secondary (01/06/2010); and Chest pain.     PAST SURGICAL HISTORY:  Past Surgical History  Procedure Date  . Nephrectomy     right kidney  . Appendectomy   . Shoulder surgery   . US echocardiography 02/26/2006    EF 55-60%  . Cardiovascular stress test 10/19/2008    EF 52%, NO ISCHEMIA  . Groin mass open biopsy      FAMILY HISTORY: family history includes Cancer in his mother; Coronary artery disease in his father; Hypertension in his father; and Uterine cancer in his mother.   SOCIAL HISTORY: Married 2 children. 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 His wife also works as a IT sales professional.  reports that he has never smoked. He has never used smokeless tobacco. He reports that he drinks about 4.2 ounces of alcohol per week. He reports that he does not use illicit drugs.   ALLERGIES: Losartan   MEDICATIONS:  Current Outpatient Prescriptions  Medication Sig Dispense Refill  . amLODipine (NORVASC) 5 MG tablet        . aspirin EC 81 MG tablet Take 81 mg by mouth daily.        [name] doxazosin (CARDURA) 4 MG tablet Take 2 mg by mouth at bedtime.        . hydrochlorothiazide 25 MG tablet Take 1 tablet (25 mg total) by mouth daily.  30 tablet  2  . Multiple Vitamin (MULTIVITAMIN) tablet Take 1 tablet by mouth daily.        . potassium chloride (K-DUR) 10 MEQ tablet Take 10 mEq by mouth  daily.        . ibuprofen (ADVIL,MOTRIN) 200 MG tablet Take 400 mg by mouth every 6 (six) hours as needed. For pain           REVIEW OF SYSTEMS:  Pertinent items are noted in HPI.    PHYSICAL EXAM:  height is 5\' 10"  (1.778 m) and weight is 225 lb (102.059 kg). His blood pressure is 155/85 and his pulse is 72. His respiration is 18 and oxygen saturation is 99%.  Head and neck examination grossly unremarkable. Oral cavity and oropharynx are unremarkable to inspection. Nodes: There is  no palpable cervical/neck/clavicular, axillary, or inguinal lymphadenopathy. Chest: Lungs clear. Back: Without spinal or CVA tenderness. Heart: Regular in rhythm. Abdomen: Without masses or organomegaly. Extremities: Without edema. Neurologic examination: Grossly nonfocal.    LABORATORY DATA:  Lab Results  Component Value Date   WBC 5.8 12/11/2011   HGB 14.7 12/11/2011   HCT 42.9 12/11/2011   MCV 87.7 12/11/2011   PLT 153 12/11/2011   Lab Results  Component Value Date   NA 140 12/11/2011   K 3.6 12/11/2011   CL 104 12/11/2011   CO2 27 12/11/2011   Lab Results  Component Value Date   ALT 17 11/13/2011   AST 21 11/13/2011   ALKPHOS 60 11/13/2011   BILITOT 0.5 11/13/2011      IMPRESSION: Low-grade B-cell non-Hodgkin's lymphoma with disease progression localized to the retroperitoneum. I explained to the patient and his wife that it would be reasonable to consider involved field radiation therapy to his retroperitoneum to hopefully place him in remission. His treatment is unlikely to be curative, but may delay the need for additional systemic therapy in the future. This lymphoma is very radiosensitive and should respond well to just 3 weeks of external beam radiation therapy. I discussed the potential acute and late toxicities of radiation therapy which should be well tolerated. Consent was signed today.   PLAN: He will return for radiation therapy simulation/treatment planning next week.   I spent 60 minutes minutes face to face with the patient and more than 50% of that time was spent in counseling and/or coordination of care.

## 2012-01-04 ENCOUNTER — Telehealth: Payer: Self-pay | Admitting: *Deleted

## 2012-01-04 NOTE — Telephone Encounter (Signed)
Notified pt that Dr. Cyndie Chime OK with cancelling 01/07/12 appt & can r/s after all RT complete.  The pt states he has simulation next tues & then 15 treatments starting following mon.  This will be 3 wks of treatment so should finish 02/01/12 if on schedule & Dr. Dayton Scrape states that he will have a f/u scan 2-63mo after RT.  Note to Dr. Cyndie Chime to see when he wants to r/s pt.

## 2012-01-04 NOTE — Telephone Encounter (Signed)
Pt. Called 01/02/11 @ 501 pm & left vm that he saw Dr. Dayton Scrape & someone left him a message that he has an appt. 01/06/11 with Dr. Cyndie Chime.  He reports that he can't make this time b/c he has something at work that he can't get out of.  He doesn't understand why he needs to see Dr. Cyndie Chime.  He can be reached at [phone].  Message to Schedulers & Dr. Cyndie Chime.

## 2012-01-07 ENCOUNTER — Ambulatory Visit: Payer: BC Managed Care – PPO | Admitting: Oncology

## 2012-01-08 ENCOUNTER — Ambulatory Visit
Admission: RE | Admit: 2012-01-08 | Discharge: 2012-01-08 | Disposition: A | Discharge status: Home / Self Care | Payer: BC Managed Care – PPO | Source: Ambulatory Visit | Attending: Radiation Oncology | Admitting: Radiation Oncology

## 2012-01-08 DIAGNOSIS — C8203 Follicular lymphoma grade I, intra-abdominal lymph nodes: Secondary | ICD-10-CM

## 2012-01-08 DIAGNOSIS — C8293 Follicular lymphoma, unspecified, intra-abdominal lymph nodes: Secondary | ICD-10-CM

## 2012-01-08 NOTE — Progress Notes (Addendum)
Simulation/treatment planning note:  The patient was taken to the CT simulator. His abdomen was scanned. I chose and isocenter along the retroperitoneum at the level of the L2 vertebra. Normal structures were contoured including his left kidney. He was set up to LAO and RPO to avoid the left kidney. 2 separate multileaf collimators are designed to shield normal shredding structures. I prescribing 3000 cGy in 15 sessions utilizing 18 MV photons. I requesting 3-D simulation for dose volume histograms including the left kidney.

## 2012-01-08 NOTE — Progress Notes (Signed)
Met with patient to discuss RO billing. Patient is self employed and advised he has a $1000 deductible that he will pay once he is billed.  Rad Tx: ([id number] Extrl Beam)  Attending Rad: Dr. Dayton Scrape  Dx: 202.03 Nodular lymphoma, intra-abdominal lymph nodes

## 2012-01-14 ENCOUNTER — Other Ambulatory Visit: Payer: Self-pay | Admitting: *Deleted

## 2012-01-14 MED ORDER — HYDROCHLOROTHIAZIDE 25 MG PO TABS: 25.0000 mg | ORAL_TABLET | Freq: Every day | ORAL | Status: DC

## 2012-01-14 MED ORDER — POTASSIUM CHLORIDE ER 10 MEQ PO TBCR: 10.0000 meq | EXTENDED_RELEASE_TABLET | Freq: Every day | ORAL | Status: DC

## 2012-01-14 NOTE — Telephone Encounter (Signed)
Fax Received. Refill Completed. Suzzette Gasparro Chowoe (R.M.A)   

## 2012-01-14 NOTE — Telephone Encounter (Signed)
Fax Received. Refill Completed. Brian Parker (R.M.A)   

## 2012-01-15 ENCOUNTER — Encounter: Payer: Self-pay | Admitting: Radiation Oncology

## 2012-01-15 ENCOUNTER — Ambulatory Visit
Admission: RE | Admit: 2012-01-15 | Discharge: 2012-01-15 | Disposition: A | Discharge status: Home / Self Care | Payer: BC Managed Care – PPO | Source: Ambulatory Visit | Attending: Radiation Oncology | Admitting: Radiation Oncology

## 2012-01-15 NOTE — Progress Notes (Signed)
Simulation verification note: The patient underwent simulation verification for treatment to his retroperitoneum. His isocenter is in good position and the multileaf collimators contoured the treatment volume appropriately.

## 2012-01-16 ENCOUNTER — Ambulatory Visit
Admission: RE | Admit: 2012-01-16 | Discharge: 2012-01-16 | Disposition: A | Discharge status: Home / Self Care | Payer: BC Managed Care – PPO | Source: Ambulatory Visit | Attending: Radiation Oncology | Admitting: Radiation Oncology

## 2012-01-17 ENCOUNTER — Ambulatory Visit
Admission: RE | Admit: 2012-01-17 | Discharge: 2012-01-17 | Disposition: A | Discharge status: Home / Self Care | Payer: BC Managed Care – PPO | Source: Ambulatory Visit | Attending: Radiation Oncology | Admitting: Radiation Oncology

## 2012-01-17 ENCOUNTER — Other Ambulatory Visit: Payer: Self-pay | Admitting: *Deleted

## 2012-01-17 NOTE — Telephone Encounter (Signed)
Opened in Error.

## 2012-01-18 ENCOUNTER — Ambulatory Visit
Admission: RE | Admit: 2012-01-18 | Discharge: 2012-01-18 | Disposition: A | Discharge status: Home / Self Care | Payer: BC Managed Care – PPO | Source: Ambulatory Visit | Attending: Radiation Oncology | Admitting: Radiation Oncology

## 2012-01-21 ENCOUNTER — Ambulatory Visit
Admission: RE | Admit: 2012-01-21 | Discharge: 2012-01-21 | Disposition: A | Discharge status: Home / Self Care | Payer: BC Managed Care – PPO | Source: Ambulatory Visit | Attending: Radiation Oncology | Admitting: Radiation Oncology

## 2012-01-21 VITALS — BP 147/94 | HR 52 | Temp 98.1°F | Wt 223.1 lb

## 2012-01-21 DIAGNOSIS — C8293 Follicular lymphoma, unspecified, intra-abdominal lymph nodes: Secondary | ICD-10-CM

## 2012-01-21 NOTE — Progress Notes (Signed)
Encounter addended by: Delynn Flavin, RN on: 01/21/2012  6:01 PM<BR>     Documentation filed: Inpatient Patient Education

## 2012-01-21 NOTE — Progress Notes (Signed)
Education documented on PUT visit

## 2012-01-21 NOTE — Progress Notes (Signed)
5/15 fractions.  States some fatigue in lower extremities.  Denies any pain today.  Denies any nausea nor vomiting.  States his appetite is "fine".

## 2012-01-21 NOTE — Progress Notes (Signed)
Encounter addended by: Delynn Flavin, RN on: 01/21/2012  6:04 PM<BR>     Documentation filed: Inpatient Document Flowsheet, Inpatient Patient Education

## 2012-01-21 NOTE — Progress Notes (Signed)
Weekly Management Note:  Site:Abdomen/Para-aortic LNs Current Dose:  800  cGy Projected Dose: 3000  cGy  Narrative: The patient is seen today for routine under treatment assessment. CBCT/MVCT images/port films were reviewed. The chart was reviewed.   No complaints today. No GI toxicity.  Physical Examination:  Filed Vitals:   01/21/12 0919  BP: 147/94  Pulse: 52  Temp: 98.1 F (36.7 C)  .  Weight: 223 lb 1.6 oz (101.197 kg). No change  Impression: Tolerating radiation therapy well.  Plan: Continue radiation therapy as planned.

## 2012-01-22 ENCOUNTER — Ambulatory Visit
Admission: RE | Admit: 2012-01-22 | Discharge: 2012-01-22 | Disposition: A | Discharge status: Home / Self Care | Payer: BC Managed Care – PPO | Source: Ambulatory Visit | Attending: Radiation Oncology | Admitting: Radiation Oncology

## 2012-01-23 ENCOUNTER — Ambulatory Visit
Admission: RE | Admit: 2012-01-23 | Discharge: 2012-01-23 | Disposition: A | Discharge status: Home / Self Care | Payer: BC Managed Care – PPO | Source: Ambulatory Visit | Attending: Radiation Oncology | Admitting: Radiation Oncology

## 2012-01-24 ENCOUNTER — Ambulatory Visit
Admission: RE | Admit: 2012-01-24 | Discharge: 2012-01-24 | Disposition: A | Discharge status: Home / Self Care | Payer: BC Managed Care – PPO | Source: Ambulatory Visit | Attending: Radiation Oncology | Admitting: Radiation Oncology

## 2012-01-25 ENCOUNTER — Ambulatory Visit
Admission: RE | Admit: 2012-01-25 | Discharge: 2012-01-25 | Disposition: A | Discharge status: Home / Self Care | Payer: BC Managed Care – PPO | Source: Ambulatory Visit | Attending: Radiation Oncology | Admitting: Radiation Oncology

## 2012-01-28 ENCOUNTER — Encounter: Payer: Self-pay | Admitting: Radiation Oncology

## 2012-01-28 ENCOUNTER — Ambulatory Visit
Admission: RE | Admit: 2012-01-28 | Discharge: 2012-01-28 | Disposition: A | Discharge status: Home / Self Care | Payer: BC Managed Care – PPO | Source: Ambulatory Visit | Attending: Radiation Oncology | Admitting: Radiation Oncology

## 2012-01-28 ENCOUNTER — Other Ambulatory Visit: Payer: Self-pay | Admitting: Oncology

## 2012-01-28 VITALS — BP 138/89 | HR 66 | Resp 18 | Wt 224.1 lb

## 2012-01-28 DIAGNOSIS — C8293 Follicular lymphoma, unspecified, intra-abdominal lymph nodes: Secondary | ICD-10-CM

## 2012-01-28 DIAGNOSIS — C649 Malignant neoplasm of unspecified kidney, except renal pelvis: Secondary | ICD-10-CM

## 2012-01-28 NOTE — Progress Notes (Signed)
Weekly Management Note:  Site:Abdomen/PALNs Current Dose:  1800  cGy Projected Dose: 3000  cGy  Narrative: The patient is seen today for routine under treatment assessment. CBCT/MVCT images/port films were reviewed. The chart was reviewed.   No complaints or GI difficulties. He would like to be treated twice a day so he can finish on Monday.  Physical Examination:  Filed Vitals:   01/28/12 0918  BP: 138/89  Pulse: 66  Resp: 18  .  Weight: 224 lb 1.6 oz (101.651 kg). No change.  Impression: Tolerating radiation therapy well.  Plan: Continue radiation therapy as planned.

## 2012-01-28 NOTE — Progress Notes (Signed)
Patient presented to the clinic today unaccompanied for under treat visit with Dr. Dayton Scrape. Patient is alert and oriented to person, place, and time. No distress noted. Steady gait noted. Pleasant affect noted. Patient denies pain at this time. Patient reports that Friday night he felt abdominal discomfort. Patient reports taking an antacid and a few hours later the pain was relieved. Patient denies nausea, vomiting, diarrhea, or headache. Patient has no other complaints. Reported all findings to Dr. Dayton Scrape.

## 2012-01-29 ENCOUNTER — Ambulatory Visit
Admission: RE | Admit: 2012-01-29 | Discharge: 2012-01-29 | Disposition: A | Discharge status: Home / Self Care | Payer: BC Managed Care – PPO | Source: Ambulatory Visit | Attending: Radiation Oncology | Admitting: Radiation Oncology

## 2012-01-29 ENCOUNTER — Telehealth: Payer: Self-pay | Admitting: Oncology

## 2012-01-29 NOTE — Telephone Encounter (Signed)
Talked to pt's wife gave her appt for lab and Ct then see md few days after scan. Instructed to pick oral contrast and NPO 4 hrs prior to scan

## 2012-01-30 ENCOUNTER — Ambulatory Visit
Admission: RE | Admit: 2012-01-30 | Discharge: 2012-01-30 | Disposition: A | Discharge status: Home / Self Care | Payer: BC Managed Care – PPO | Source: Ambulatory Visit | Attending: Radiation Oncology | Admitting: Radiation Oncology

## 2012-01-31 ENCOUNTER — Ambulatory Visit
Admission: RE | Admit: 2012-01-31 | Discharge: 2012-01-31 | Disposition: A | Discharge status: Home / Self Care | Payer: BC Managed Care – PPO | Source: Ambulatory Visit | Attending: Radiation Oncology | Admitting: Radiation Oncology

## 2012-02-01 ENCOUNTER — Ambulatory Visit
Admission: RE | Admit: 2012-02-01 | Discharge: 2012-02-01 | Disposition: A | Discharge status: Home / Self Care | Payer: BC Managed Care – PPO | Source: Ambulatory Visit | Attending: Radiation Oncology | Admitting: Radiation Oncology

## 2012-02-04 ENCOUNTER — Encounter: Payer: Self-pay | Admitting: Radiation Oncology

## 2012-02-04 ENCOUNTER — Ambulatory Visit
Admission: RE | Admit: 2012-02-04 | Discharge: 2012-02-04 | Disposition: A | Discharge status: Home / Self Care | Payer: BC Managed Care – PPO | Source: Ambulatory Visit | Attending: Radiation Oncology | Admitting: Radiation Oncology

## 2012-02-04 VITALS — BP 139/90 | HR 63 | Resp 18 | Wt 222.4 lb

## 2012-02-04 DIAGNOSIS — C8293 Follicular lymphoma, unspecified, intra-abdominal lymph nodes: Secondary | ICD-10-CM

## 2012-02-04 NOTE — Progress Notes (Signed)
Weekly Management Note:  Site:Abdomen/PALNs Current Dose:  3000  cGy Projected Dose: 3000  cGy  Narrative: The patient is seen today for routine under treatment assessment. CBCT/MVCT images/port films were reviewed. The chart was reviewed.   No complaints today. No fatigue or nausea.  Physical Examination:  Filed Vitals:   02/04/12 0936  BP: 139/90  Pulse: 63  Resp: 18  .  Weight: 222 lb 6.4 oz (100.88 kg). No change.  Impression: Tolerating radiation therapy well.  Plan: Radiation therapy completed today. He'll return for a followup visit in one month.

## 2012-02-04 NOTE — Progress Notes (Signed)
[facility] Radiation Oncology  Brian Parker  Date:02/04/2012           ZOX:[id number] DOB:19-Feb-1950   Status:outpatient    CC: Neldon Labella, MD, MD  Dr. Cephas Darby  REFERRING PHYSICIAN: Dr. Cephas Darby  DIAGNOSIS: Non-Hodgkin's lymphoma, low grade B cell  INDICATION FOR TREATMENT: Palliative   TREATMENT DATES: 01/16/2012 through 02/04/2012                          SITE/DOSE:   Para-aortic lymph nodes 3000 cGy 15 sessions                         BEAMS/ENERGY:   18 MV photons, slightly obliqued LAO and RPO fields                NARRATIVE:  He tolerated his treatment beautifully with no GI toxicity or fatigue during his course of therapy.                          PLAN: Routine followup in one month. Patient instructed to call if questions or worsening complaints in interim.

## 2012-02-04 NOTE — Progress Notes (Signed)
Patient presents to the clinic today unaccompanied for an under treat visit with Dr. Dayton Scrape. Patient is alert and oriented to person, place, and time. No distress noted. Steady gait noted. Pleasant affect noted. Patient denies pain at this time. Patient denies hematuria or burning upon urination. Patient reports that on average he gets up twice a night to void. Patient states,"I don't feel any different now than before I started treatment." Patient concerned that CT of the body scheduled by Dr. Cyndie Chime in April is "too soon." Provided patient with an appointment card to return in one month for follow up. Patient reports that he has noted fatigue. Reported all findings to Dr. Dayton Scrape.

## 2012-02-05 ENCOUNTER — Ambulatory Visit: Payer: BC Managed Care – PPO

## 2012-02-13 ENCOUNTER — Ambulatory Visit (INDEPENDENT_AMBULATORY_CARE_PROVIDER_SITE_OTHER): Payer: BC Managed Care – PPO | Admitting: Cardiovascular Disease

## 2012-02-13 ENCOUNTER — Encounter: Payer: Self-pay | Admitting: Cardiovascular Disease

## 2012-02-13 VITALS — BP 140/87 | HR 55 | Ht 70.0 in | Wt 220.1 lb

## 2012-02-13 DIAGNOSIS — I10 Essential (primary) hypertension: Secondary | ICD-10-CM

## 2012-02-13 NOTE — Progress Notes (Signed)
Brian Parker Date of Birth  July 11, 1950 [facility]     [place] [facility]  [address]    [address]   [address] [place], [place]  [address]    [place], [place]  [address] [phone]  Fax  [phone]  [phone]  Fax [phone]  1. Hypertension 2. Non Hodgkins Lymphoma  History of Present Illness:  Brian Parker has done well from a cardiac standpoint.  He recently had XRT for a growing lymph node in his abdomen.   His BP has been well controlled at home.    Current Outpatient Prescriptions on File Prior to Visit  Medication Sig Dispense Refill  . aspirin EC 81 MG tablet Take 81 mg by mouth daily.        [name] doxazosin (CARDURA) 4 MG tablet Take 2 mg by mouth at bedtime.        . hydrochlorothiazide (HYDRODIURIL) 25 MG tablet Take 1 tablet (25 mg total) by mouth daily.  30 tablet  5  . ibuprofen (ADVIL,MOTRIN) 200 MG tablet Take 400 mg by mouth every 6 (six) hours as needed. For pain       . Multiple Vitamin (MULTIVITAMIN) tablet Take 1 tablet by mouth daily.        . potassium chloride (K-DUR) 10 MEQ tablet Take 1 tablet (10 mEq total) by mouth daily.  30 tablet  5  Amlodipine 5 mg a day   Allergies  Allergen Reactions  . Losartan     Stomach cramps.    Past Medical History  Diagnosis Date  . Non Hodgkin's lymphoma   . Hypertension   . Edema of lower extremity   . Follicular lymphoma grade I of intra-abdominal lymph nodes 12/31/2006  . Elevated prostate specific antigen (PSA) 11/20/2011  . Nodular lymphoma involving intra-abdominal lymph nodes 12/20/2011  . Kidney carcinoma 03/01/2003    tumor seperate from lymphoma; found during routine screening for lymphoma  . Cancer of kidney, secondary 01/06/2010    right kidney//right kidney removed//left kidney functions WDL  . Chest pain     INTERMITTENT/15 years ago//stress induced    Past Surgical History  Procedure Date  . Nephrectomy     right kidney  . Appendectomy   . Shoulder surgery   . US  echocardiography 02/26/2006    EF 55-60%  . Cardiovascular stress test 10/19/2008    EF 52%, NO ISCHEMIA  . Groin mass open biopsy     History  Smoking status  . Never Smoker   Smokeless tobacco  . Never Used    History  Alcohol Use  . 4.2 oz/week  . 6 Glasses of wine, 1 Cans of beer per week    Family History  Problem Relation Age of Onset  . Uterine cancer Mother   . Cancer Mother     cervical  . Coronary artery disease Father   . Hypertension Father     Reviw of Systems:  Reviewed in the HPI.  All other systems are negative.  Physical Exam: Blood pressure 140/87, pulse 55, height 5\' 10"  (1.778 m), weight 220 lb 1.9 oz (99.846 kg). General: Well developed, well nourished, in no acute distress.  Head: Normocephalic, atraumatic, sclera non-icteric, mucus membranes are moist,   Neck: Supple. Negative for carotid bruits. JVD not elevated.  Lungs: Clear bilaterally to auscultation without wheezes, rales, or rhonchi. Breathing is unlabored.  Heart: RRR with S1 S2. No murmurs, rubs, or gallops appreciated.  Abdomen: Soft, non-tender, non-distended with normoactive  bowel sounds. No hepatomegaly. No rebound/guarding. No obvious abdominal masses.  Msk:  Strength and tone appear normal for age.  Extremities: No clubbing or cyanosis. No edema.  Distal pedal pulses are 2+ and equal bilaterally.  Neuro: Alert and oriented X 3. Moves all extremities spontaneously.  Psych:  Responds to questions appropriately with a normal affect.  ECG:  Assessment / Plan:

## 2012-02-13 NOTE — Assessment & Plan Note (Signed)
His BP is doing well.  It is well controlled at home.  He'll do better with a good diet, exercise, and weight loss plan. I'll see him in 6 months.

## 2012-02-13 NOTE — Patient Instructions (Addendum)
Your physician wants you to follow-up in: 6 months sooner if needed You will receive a reminder letter in the mail two months in advance. If you don't receive a letter, please call our office to schedule the follow-up appointment  Your physician recommends that you continue on your current medications as directed. Please refer to the Current Medication list given to you today.

## 2012-02-21 ENCOUNTER — Telehealth: Payer: Self-pay | Admitting: Oncology

## 2012-02-21 NOTE — Telephone Encounter (Signed)
Pt called and left message, called pt back and inform him of MD visit in May 2013.

## 2012-03-04 ENCOUNTER — Encounter: Payer: Self-pay | Admitting: Radiation Oncology

## 2012-03-04 ENCOUNTER — Ambulatory Visit
Admission: RE | Admit: 2012-03-04 | Discharge: 2012-03-04 | Disposition: A | Discharge status: Home / Self Care | Payer: BC Managed Care – PPO | Source: Ambulatory Visit | Attending: Radiation Oncology | Admitting: Radiation Oncology

## 2012-03-04 VITALS — BP 143/84 | HR 55 | Temp 98.6°F | Resp 18 | Wt 226.6 lb

## 2012-03-04 DIAGNOSIS — C8293 Follicular lymphoma, unspecified, intra-abdominal lymph nodes: Secondary | ICD-10-CM

## 2012-03-04 NOTE — Progress Notes (Signed)
Followup note:  Brian Parker returns today approximately 1 month following completion of radiation therapy to his periaortic lymph nodes in the management of his low-grade B-cell non-Hodgkin's lymphoma. He remains asymptomatic. He denies B. symptoms. He will have a followup/staging CT scan of the abdomen on April 30 to see Dr. Cyndie Chime for a followup visit in May.  Physical examination: Head and neck examination grossly unremarkable. Nodes: Without palpable cervical or supraclavicular lymphadenopathy. Chest: Lungs clear. Abdomen: Without masses or organomegaly.  Impression: Satisfactory progress.  Plan: He'll have a followup CT scan on April 30 to see Dr. Cyndie Chime for a followup visit in May. I have not scheduled the patient for a formal followup visit and I ask that Dr. Cyndie Chime keep me posted on his progress. I've also requested a copy of his CT scan for April 30.

## 2012-03-04 NOTE — Progress Notes (Signed)
Patient presents to the clinic today unaccompanied for a follow up appointment with Dr. Dayton Scrape. Patient is alert and oriented to person, place, and time. No distress noted. Steady gait noted. Pleasant affect noted. Patient denies pain at this time. Will see urologist this month for PSA check. Patient seen by cardiologist who confirmed he was "good to go and no changes were made to medications."  Patient denies nausea, vomiting, headache, dizziness, or cough. CT pushed out until the end of April. Patient reports he feels just "like I did two months ago." Reported all findings to Dr. Dayton Scrape.

## 2012-03-26 ENCOUNTER — Telehealth: Payer: Self-pay | Admitting: Oncology

## 2012-03-26 NOTE — Telephone Encounter (Signed)
S/w the pt regarding his may 10th appt has been r/s to 05/06/2012 due to the md is on vac on 05/09/2012

## 2012-04-01 ENCOUNTER — Other Ambulatory Visit (HOSPITAL_COMMUNITY): Payer: BC Managed Care – PPO

## 2012-04-01 ENCOUNTER — Other Ambulatory Visit: Payer: BC Managed Care – PPO

## 2012-04-04 ENCOUNTER — Ambulatory Visit: Payer: BC Managed Care – PPO | Admitting: Oncology

## 2012-04-21 ENCOUNTER — Other Ambulatory Visit: Payer: Self-pay | Admitting: *Deleted

## 2012-04-21 DIAGNOSIS — C8293 Follicular lymphoma, unspecified, intra-abdominal lymph nodes: Secondary | ICD-10-CM

## 2012-04-22 ENCOUNTER — Other Ambulatory Visit: Payer: Self-pay | Admitting: Cardiovascular Disease

## 2012-04-22 NOTE — Telephone Encounter (Signed)
Fax Received. Refill Completed. Brian Parker (R.M.A)   

## 2012-04-29 ENCOUNTER — Ambulatory Visit (HOSPITAL_COMMUNITY)
Admission: RE | Admit: 2012-04-29 | Discharge: 2012-04-29 | Disposition: A | Discharge status: Home / Self Care | Payer: BC Managed Care – PPO | Source: Ambulatory Visit | Attending: Oncology | Admitting: Oncology

## 2012-04-29 ENCOUNTER — Other Ambulatory Visit (HOSPITAL_BASED_OUTPATIENT_CLINIC_OR_DEPARTMENT_OTHER): Payer: BC Managed Care – PPO | Admitting: Lab

## 2012-04-29 ENCOUNTER — Other Ambulatory Visit: Payer: Self-pay | Admitting: Oncology

## 2012-04-29 ENCOUNTER — Other Ambulatory Visit (HOSPITAL_COMMUNITY): Payer: BC Managed Care – PPO

## 2012-04-29 DIAGNOSIS — C8293 Follicular lymphoma, unspecified, intra-abdominal lymph nodes: Secondary | ICD-10-CM

## 2012-04-29 DIAGNOSIS — Z923 Personal history of irradiation: Secondary | ICD-10-CM | POA: Insufficient documentation

## 2012-04-29 DIAGNOSIS — Z9221 Personal history of antineoplastic chemotherapy: Secondary | ICD-10-CM | POA: Insufficient documentation

## 2012-04-29 DIAGNOSIS — Z85528 Personal history of other malignant neoplasm of kidney: Secondary | ICD-10-CM | POA: Insufficient documentation

## 2012-04-29 DIAGNOSIS — Z905 Acquired absence of kidney: Secondary | ICD-10-CM | POA: Insufficient documentation

## 2012-04-29 DIAGNOSIS — R599 Enlarged lymph nodes, unspecified: Secondary | ICD-10-CM | POA: Insufficient documentation

## 2012-04-29 DIAGNOSIS — C649 Malignant neoplasm of unspecified kidney, except renal pelvis: Secondary | ICD-10-CM

## 2012-04-29 DIAGNOSIS — C8589 Other specified types of non-Hodgkin lymphoma, extranodal and solid organ sites: Secondary | ICD-10-CM | POA: Insufficient documentation

## 2012-04-29 LAB — CBC WITH DIFFERENTIAL/PLATELET
BASO%: 0.5 % (ref 0.0–2.0)
EOS%: 4.2 % (ref 0.0–7.0)
LYMPH%: 10.8 % — ABNORMAL LOW (ref 14.0–49.0)
MCH: 31.8 pg (ref 27.2–33.4)
MCHC: 34.7 g/dL (ref 32.0–36.0)
MONO#: 0.3 10*3/uL (ref 0.1–0.9)
Platelets: 122 10*3/uL — ABNORMAL LOW (ref 140–400)
RBC: 4.8 10*6/uL (ref 4.20–5.82)
WBC: 4.8 10*3/uL (ref 4.0–10.3)
nRBC: 0 % (ref 0–0)

## 2012-04-29 LAB — CMP (CANCER CENTER ONLY)
AST: 23 U/L (ref 11–38)
Alkaline Phosphatase: 56 U/L (ref 26–84)
BUN, Bld: 18 mg/dL (ref 7–22)
Creat: 1.3 mg/dl — ABNORMAL HIGH (ref 0.6–1.2)
Potassium: 4.3 mEq/L (ref 3.3–4.7)
Total Bilirubin: 0.9 mg/dl (ref 0.20–1.60)

## 2012-05-06 ENCOUNTER — Ambulatory Visit: Payer: BC Managed Care – PPO | Admitting: Oncology

## 2012-05-07 ENCOUNTER — Telehealth: Payer: Self-pay | Admitting: *Deleted

## 2012-05-07 ENCOUNTER — Telehealth: Payer: Self-pay | Admitting: Oncology

## 2012-05-07 NOTE — Telephone Encounter (Signed)
Gave pt calendar for July, pt missed appt for 05/06/12, informed nurse

## 2012-05-07 NOTE — Telephone Encounter (Signed)
Pt called & left message about missed appt yest.  He states there was a mix up in communication & was told that the appt was today 05/07/12 @ 10:30.  He also did not receive a phone call prior to appt.  He reports that he had his scans done & would like a call back regarding results.  He rescheduled to July 21, 2012.

## 2012-05-09 ENCOUNTER — Ambulatory Visit: Payer: BC Managed Care – PPO | Admitting: Oncology

## 2012-07-21 ENCOUNTER — Ambulatory Visit (HOSPITAL_BASED_OUTPATIENT_CLINIC_OR_DEPARTMENT_OTHER): Payer: BC Managed Care – PPO | Admitting: Oncology

## 2012-07-21 ENCOUNTER — Telehealth: Payer: Self-pay | Admitting: Oncology

## 2012-07-21 VITALS — BP 150/101 | HR 59 | Temp 97.5°F | Ht 70.0 in | Wt 220.8 lb

## 2012-07-21 DIAGNOSIS — C649 Malignant neoplasm of unspecified kidney, except renal pelvis: Secondary | ICD-10-CM

## 2012-07-21 DIAGNOSIS — C8293 Follicular lymphoma, unspecified, intra-abdominal lymph nodes: Secondary | ICD-10-CM

## 2012-07-21 NOTE — Progress Notes (Signed)
Hematology and Oncology Follow Up Visit  Brian Parker [id number] 10/13/1950 62 y.o. 07/21/2012 6:58 PM   Principle Diagnosis: Encounter Diagnoses  Name Primary?  . Nodular lymphoma involving intra-abdominal lymph nodes Yes  . Kidney carcinoma      Interim History:   Followup visit for this 62 year old man initially diagnosed with a follicular grade 1 B-cell, non-Hodgkin's lymphoma in 2003 (there was a typographical error in my note dictated on 11/20/2011 which stated he was diagnosed in 2008.) He was treated with 8 cycles of CHOP/Rituxan through July 2003. He was started on maintenance rituximab but only had 2 cycles due  to development of delayed neutropenia. There was suspicion for early progression on a CT scan done in July of 2009 which showed a single area of soft tissue density in the left periaortic region. This area remained stable on serial scans until a study done on 11/13/2011 which did show clear regrowth in this area enlarging from 2.6 x 0.9 cm to approximately 4.9 x 3.4 cm. A PET scan was done on 11/30/2011 which showed a single area of markedly abnormal activity with SUV up to 10.3 over the abnormal area of lymphadenopathy seen on CT scan. There were no other areas of activity. Given this fact, I felt it would be reasonable to offer him involved field radiation. I discussed this with Dr.Rizzieri at [facility] who has been comanaging   the patient with me. He agreed with this plan. He was evaluated by Dr. Chipper Herb and received 3000 cGy in 15 fractions to the left para-aortic lymph node mass between January 16 and  02/04/2012. He tolerated treatment well.  A post treatment CT scan done 04/29/2012 which I personally reviewed shows a dramatic reduction in the lymph node mass with minimal residual soft tissue density. Precise measurements are difficult to make since the mass overlies the aorta but radiologist measures this as 1.5 x 1 cm. No other new areas of concern. Previous right  nephrectomy noted.  Performance status remains excellent and he is working full-time. He is having some problems with his left knee due to degenerative arthritis. He may need to have a knee replacement soon. He also had some laser treatments of varicose veins of the left leg recently. He reports that he still gets some sweats at night but these are not drenching sweats. No fevers. No interim infection.  Medications: reviewed  Allergies:  Allergies  Allergen Reactions  . Losartan     Stomach cramps.    Review of Systems: Constitutional:   No constitutional symptoms Respiratory: No cough or dyspnea Cardiovascular:  No chest pain or palpitations Gastrointestinal: No change in bowel habit Genito-Urinary: No urinary tract symptoms Musculoskeletal: See above Neurologic: No headache or change in vision Skin: No rash or ecchymoses Remaining ROS negative.  Physical Exam: Blood pressure 150/101, pulse 59, temperature 97.5 F (36.4 C), temperature source Oral, height 5\' 10"  (1.778 m), weight 220 lb 12.8 oz (100.154 kg). Wt Readings from Last 3 Encounters:  07/21/12 220 lb 12.8 oz (100.154 kg)  03/04/12 226 lb 9.6 oz (102.785 kg)  02/13/12 220 lb 1.9 oz (99.846 kg)     General appearance: Well-nourished Caucasian man. He has lost weight on a exercise program HENNT: Pharynx no erythema or exudate Lymph nodes: Persistent 2 cm left axillary lymph node palpable and unchanged from multiple prior exams. No other areas of adenopathy Breasts: Lungs: Clear to auscultation resonant to percussion Heart: Regular rhythm no murmur Abdomen: Soft nontender no mass no  organomegaly Extremities: No edema no calf tenderness. Vascular: No cyanosis Neurologic: No focal deficit Skin: No rash or ecchymosis  Lab Results: Lab Results  Component Value Date   WBC 4.8 04/29/2012   HGB 15.3 04/29/2012   HCT 43.9 04/29/2012   MCV 91.5 04/29/2012   PLT 122* 04/29/2012     Chemistry      Component Value  Date/Time   NA 142 04/29/2012 0906   NA 140 12/11/2011 1135   K 4.3 04/29/2012 0906   K 3.6 12/11/2011 1135   CL 99 04/29/2012 0906   CL 104 12/11/2011 1135   CO2 28 04/29/2012 0906   CO2 27 12/11/2011 1135   BUN 18 04/29/2012 0906   BUN 18 12/11/2011 1135   CREATININE 1.3* 04/29/2012 0906   CREATININE 1.02 12/11/2011 1135      Component Value Date/Time   CALCIUM 8.6 04/29/2012 0906   CALCIUM 9.5 12/11/2011 1135   ALKPHOS 56 04/29/2012 0906   ALKPHOS 60 11/13/2011 0810   AST 23 04/29/2012 0906   AST 21 11/13/2011 0810   ALT 17 11/13/2011 0810   BILITOT 0.90 04/29/2012 0906   BILITOT 0.5 11/13/2011 0810       Radiological Studies: No results found.  Impression and Plan: #1. Local progression of residual left periaortic lymph nodes treated with involved field radiation with excellent response. No other areas of active disease. Going to get a followup CT scan in 4 months and if this is stable then decrease frequency of scans.  #2. Metachronous primary right kidney cancers, initial 1.4 cm T1a lesion status post partial nephrectomy March 2004. Second primary 1.3 cm complex lesion, status post right nephrectomy 01/06/10 with findings of a 1.5 cm grade 3 of 4 renal cell carcinoma with invasion into the renal capsule but no vascular invasion.   #3. History of chronically elevated PSA in the range of 6 units. Repetitive biopsies to date negative for cancer. Ongoing follow up with his urologist.   #4. Essential hypertension. Currently on diuretic alone.     CC:. Dr. Sigmund Hazel; Dr. Azzie Roup; Dr. Chipper Herb; Dr. Rhea Pink   Levert Feinstein, MD 7/22/20136:58 PM

## 2012-07-21 NOTE — Telephone Encounter (Signed)
gv pt appt schedule for September including ct for 9/4.

## 2012-09-02 ENCOUNTER — Other Ambulatory Visit (HOSPITAL_BASED_OUTPATIENT_CLINIC_OR_DEPARTMENT_OTHER): Payer: BC Managed Care – PPO | Admitting: Lab

## 2012-09-02 DIAGNOSIS — C8293 Follicular lymphoma, unspecified, intra-abdominal lymph nodes: Secondary | ICD-10-CM

## 2012-09-02 DIAGNOSIS — C649 Malignant neoplasm of unspecified kidney, except renal pelvis: Secondary | ICD-10-CM

## 2012-09-02 LAB — CBC WITH DIFFERENTIAL/PLATELET
BASO%: 0.7 % (ref 0.0–2.0)
Eosinophils Absolute: 0.2 10*3/uL (ref 0.0–0.5)
LYMPH%: 13.9 % — ABNORMAL LOW (ref 14.0–49.0)
MCHC: 34.4 g/dL (ref 32.0–36.0)
MONO#: 0.2 10*3/uL (ref 0.1–0.9)
NEUT#: 2.7 10*3/uL (ref 1.5–6.5)
Platelets: 117 10*3/uL — ABNORMAL LOW (ref 140–400)
RBC: 4.79 10*6/uL (ref 4.20–5.82)
RDW: 13.8 % (ref 11.0–14.6)
WBC: 3.7 10*3/uL — ABNORMAL LOW (ref 4.0–10.3)
lymph#: 0.5 10*3/uL — ABNORMAL LOW (ref 0.9–3.3)

## 2012-09-02 LAB — LACTATE DEHYDROGENASE (CC13): LDH: 179 U/L (ref 125–220)

## 2012-09-02 LAB — COMPREHENSIVE METABOLIC PANEL (CC13)
ALT: 17 U/L (ref 0–55)
AST: 18 U/L (ref 5–34)
Albumin: 3.6 g/dL (ref 3.5–5.0)
Alkaline Phosphatase: 64 U/L (ref 40–150)
Calcium: 8.5 mg/dL (ref 8.4–10.4)
Chloride: 106 mEq/L (ref 98–107)
Potassium: 3.8 mEq/L (ref 3.5–5.1)
Sodium: 138 mEq/L (ref 136–145)
Total Protein: 5.8 g/dL — ABNORMAL LOW (ref 6.4–8.3)

## 2012-09-02 LAB — SEDIMENTATION RATE: Sed Rate: 1 mm/hr (ref 0–16)

## 2012-09-03 ENCOUNTER — Ambulatory Visit (HOSPITAL_COMMUNITY)
Admission: RE | Admit: 2012-09-03 | Discharge: 2012-09-03 | Disposition: A | Discharge status: Home / Self Care | Payer: BC Managed Care – PPO | Source: Ambulatory Visit | Attending: Oncology | Admitting: Oncology

## 2012-09-03 DIAGNOSIS — K5730 Diverticulosis of large intestine without perforation or abscess without bleeding: Secondary | ICD-10-CM | POA: Insufficient documentation

## 2012-09-03 DIAGNOSIS — I700 Atherosclerosis of aorta: Secondary | ICD-10-CM | POA: Insufficient documentation

## 2012-09-03 DIAGNOSIS — N400 Enlarged prostate without lower urinary tract symptoms: Secondary | ICD-10-CM | POA: Insufficient documentation

## 2012-09-03 DIAGNOSIS — R188 Other ascites: Secondary | ICD-10-CM | POA: Insufficient documentation

## 2012-09-03 DIAGNOSIS — C649 Malignant neoplasm of unspecified kidney, except renal pelvis: Secondary | ICD-10-CM

## 2012-09-03 DIAGNOSIS — J984 Other disorders of lung: Secondary | ICD-10-CM | POA: Insufficient documentation

## 2012-09-03 DIAGNOSIS — C8293 Follicular lymphoma, unspecified, intra-abdominal lymph nodes: Secondary | ICD-10-CM

## 2012-09-05 ENCOUNTER — Telehealth: Payer: Self-pay | Admitting: Oncology

## 2012-09-05 ENCOUNTER — Telehealth: Payer: Self-pay | Admitting: *Deleted

## 2012-09-05 ENCOUNTER — Ambulatory Visit (HOSPITAL_BASED_OUTPATIENT_CLINIC_OR_DEPARTMENT_OTHER): Payer: BC Managed Care – PPO | Admitting: Oncology

## 2012-09-05 VITALS — BP 135/86 | HR 67 | Temp 98.5°F | Resp 18 | Ht 70.0 in | Wt 217.4 lb

## 2012-09-05 DIAGNOSIS — C8293 Follicular lymphoma, unspecified, intra-abdominal lymph nodes: Secondary | ICD-10-CM

## 2012-09-05 DIAGNOSIS — I10 Essential (primary) hypertension: Secondary | ICD-10-CM

## 2012-09-05 DIAGNOSIS — C649 Malignant neoplasm of unspecified kidney, except renal pelvis: Secondary | ICD-10-CM

## 2012-09-05 NOTE — Telephone Encounter (Signed)
Gave pt appt for March 2014 lab and MD, CT needs to be scheduled, gave pt oral contrast

## 2012-09-05 NOTE — Telephone Encounter (Signed)
Message copied by Sabino Snipes on Fri Sep 05, 2012 12:38 PM ------      Message from: Levert Feinstein      Created: Wed Sep 03, 2012  5:37 PM       Call - CT - ongoing remission of lymphoma

## 2012-09-05 NOTE — Progress Notes (Signed)
Hematology and Oncology Follow Up Visit  Brian Parker [id number] 12-06-1950 62 y.o. 09/05/2012 6:28 PM   Principle Diagnosis: Encounter Diagnoses  Name Primary?  . Nodular lymphoma involving intra-abdominal lymph nodes Yes  . Kidney carcinoma      Followup visit for this 62 year old man initially diagnosed with a follicular grade 1 B-cell, non-Hodgkin's lymphoma in 2003 (there was a typographical error in my note dictated on 11/20/2011 which stated he was diagnosed in 2008.) He was treated with 8 cycles of CHOP/Rituxan through July 2003. He was started on maintenance rituximab but only had 2 cycles due to development of delayed neutropenia. There was suspicion for early progression on a CT scan done in July of 2009 which showed a single area of soft tissue density in the left periaortic region. This area remained stable on serial scans until a study done on 11/13/2011 which did show clear regrowth in this area enlarging from 2.6 x 0.9 cm to approximately 4.9 x 3.4 cm. A PET scan was done on 11/30/2011 which showed a single area of markedly abnormal activity with SUV up to 10.3 over the abnormal area of lymphadenopathy seen on CT scan. There were no other areas of activity.  Given this fact, I felt it would be reasonable to offer him involved field radiation. I discussed this with Dr.Rizzieri at [facility] who has been comanaging the patient with me. He agreed with this plan.  He was evaluated by Dr. Chipper Herb and received 3000 cGy in 15 fractions to the left para-aortic lymph node mass between January 16 and 02/04/2012. He tolerated treatment well.  A post treatment CT scan done 04/29/2012 which I personally reviewed shows a dramatic reduction in the lymph node mass with minimal residual soft tissue density. Precise measurements are difficult to make since the mass overlies the aorta but radiologist measures this as 1.5 x 1 cm. A followup study was done in anticipation of today's visit on 09/03/2012 I  have personally reviewed these images. There is residual but minimal soft tissue density in the area of previous radiation treatment left periaortic unchanged compared with the April study. No other areas of adenopathy. Spleen is normal.  He is doing well. He has had no interim medical problems. No residual side effects from radiation treatments. He continues to work full-time.    Medications: reviewed  Allergies:  Allergies  Allergen Reactions  . Losartan     Stomach cramps.    Review of Systems: Constitutional:   No constitutional symptoms Respiratory: No cough or dyspnea Cardiovascular:  No chest pain or palpitations Gastrointestinal: No change in bowel habit Genito-Urinary: Recent visit with his urologist. Prostate exam stable. PSA remains mildly elevated. Prostate moderately enlarged current CT scan. No muscle or bone pain no headache or change in vision Musculoskeletal: Neurologic: Skin: No rash or ecchymosis Remaining ROS negative.  Physical Exam: Blood pressure 135/86, pulse 67, temperature 98.5 F (36.9 C), temperature source Oral, resp. rate 18, height 5\' 10"  (1.778 m), weight 217 lb 6.4 oz (98.612 kg). Wt Readings from Last 3 Encounters:  09/05/12 217 lb 6.4 oz (98.612 kg)  07/21/12 220 lb 12.8 oz (100.154 kg)  03/04/12 226 lb 9.6 oz (102.785 kg)     General appearance: He is gaining back some of the excessive weight that he deliberately lost HENNT: He looks much healthier no erythema or exudate in the pharynx Lymph nodes: No cervical supraclavicular axillary or inguinal lymphadenopathy Breasts: Lungs: Clear to auscultation resonant to percussion Heart: Regular rhythm  no murmur Abdomen: Soft nontender no mass no organomegaly Extremities: No edema no calf tenderness Vascular: No cyanosis Neurologic: Motor strength 5 over 5 reflexes 1+ symmetric Skin: No rash or ecchymosis  Lab Results: Lab Results  Component Value Date   WBC 3.7* 09/02/2012   HGB 14.8  09/02/2012   HCT 43.1 09/02/2012   MCV 90.1 09/02/2012   PLT 117* 09/02/2012     Chemistry      Component Value Date/Time   NA 138 09/02/2012 0810   NA 142 04/29/2012 0906   NA 140 12/11/2011 1135   K 3.8 09/02/2012 0810   K 4.3 04/29/2012 0906   K 3.6 12/11/2011 1135   CL 106 09/02/2012 0810   CL 99 04/29/2012 0906   CL 104 12/11/2011 1135   CO2 25 09/02/2012 0810   CO2 28 04/29/2012 0906   CO2 27 12/11/2011 1135   BUN 11.0 09/02/2012 0810   BUN 18 04/29/2012 0906   BUN 18 12/11/2011 1135   CREATININE 1.0 09/02/2012 0810   CREATININE 1.3* 04/29/2012 0906   CREATININE 1.02 12/11/2011 1135      Component Value Date/Time   CALCIUM 8.5 09/02/2012 0810   CALCIUM 8.6 04/29/2012 0906   CALCIUM 9.5 12/11/2011 1135   ALKPHOS 64 09/02/2012 0810   ALKPHOS 56 04/29/2012 0906   ALKPHOS 60 11/13/2011 0810   AST 18 09/02/2012 0810   AST 23 04/29/2012 0906   AST 21 11/13/2011 0810   ALT 17 09/02/2012 0810   ALT 17 11/13/2011 0810   BILITOT 0.60 09/02/2012 0810   BILITOT 0.90 04/29/2012 0906   BILITOT 0.5 11/13/2011 0810       Radiological Studies: Ct Abdomen Pelvis Wo Contrast  09/03/2012  *RADIOLOGY REPORT*  Clinical Data: 7 months post radiation therapy for recurrent non- Hodgkins lymphoma, history of renal cancer status post right nephrectomy  CT ABDOMEN AND PELVIS WITHOUT CONTRAST  Technique:  Multidetector CT imaging of the abdomen and pelvis was performed following the standard protocol without intravenous contrast.  Comparison: 04/29/2012  Findings: Mild linear scarring in the right lower lobe.  Tiny probable cysts in the right hepatic lobe (series 2/images 7 and 15).  Spleen is normal in size, measuring 11.0 cm.  Pancreas and adrenal glands are within normal limits.  Gallbladder is underdistended.  No intrahepatic or extrahepatic ductal dilatation.  Status post right nephrectomy.  No abnormal soft tissue in the surgical bed.  Left kidney is unremarkable.  No renal calculi or hydronephrosis.  No evidence of bowel  obstruction.  Colonic diverticulosis, without associated inflammatory changes.  Atherosclerotic calcifications of the abdominal aorta and branch vessels.  Retroperitoneal/mesenteric stranding, compatible with treated lymphoma.  1.9 x 1.0 cm left para-aortic nodal soft tissue (series 2/image 34), unchanged.  9 mm short-axis left external iliac node (series 2/image 63), unchanged.  Small volume pelvic ascites.  Mild prostatomegaly, measuring 5.3 cm in transverse dimension.  Bladder is mildly thick-walled although underdistended.  Ventral hernia mesh repair.  Mild degenerative changes of the visualized thoracolumbar spine.  IMPRESSION: No evidence of new/progressive lymphomatous involvement in the abdomen/pelvis.  Stable left para-aortic/retroperitoneal soft tissue/stranding, likely reflecting treated lymphoma.   Original Report Authenticated By: Charline Bills, M.D.     Impression and Plan: #1. Relapsed low-grade B-cell non-Hodgkin's lymphoma induced  back into a radiographic complete response with involved field radiation to a recurrent periaortic lymph node mass. I will get another scan in 6 months and then if stable decrease scans to an annual basis.  #  2. Metachronous primary right kidney cancers, initial 1.4 cm T1a lesion status post partial nephrectomy March 2004. Second primary 1.3 cm complex lesion, status post right nephrectomy 01/06/10 with findings of a 1.5 cm grade 3 of 4 renal cell carcinoma with invasion into the renal capsule but no vascular invasion.   #3. History of chronically elevated PSA in the range of 6 units. Repetitive biopsies to date negative for cancer. Ongoing follow up with his urologist.   #4. Essential hypertension. Currently on diuretic alone      CC:. Dr. Sigmund Hazel; Dr. Loraine Leriche line; Dr. Chipper Herb; Dr. Rhea Pink - [facility]   Levert Feinstein, MD 9/6/20136:28 PM I

## 2012-09-05 NOTE — Telephone Encounter (Signed)
Pt notified of CT results per Dr Granfortuna.  

## 2012-09-08 ENCOUNTER — Telehealth: Payer: Self-pay | Admitting: Oncology

## 2012-09-08 NOTE — Telephone Encounter (Signed)
Talked to patient , gave him appt for CT on 03/02/12, NPO after midnight

## 2012-12-17 ENCOUNTER — Other Ambulatory Visit: Payer: Self-pay | Admitting: Orthopedic Surgery

## 2013-01-02 ENCOUNTER — Other Ambulatory Visit: Payer: Self-pay | Admitting: *Deleted

## 2013-01-02 MED ORDER — HYDROCHLOROTHIAZIDE 25 MG PO TABS: 25.0000 mg | ORAL_TABLET | Freq: Every day | ORAL | Status: DC

## 2013-01-02 MED ORDER — DOXAZOSIN MESYLATE 4 MG PO TABS: 2.0000 mg | ORAL_TABLET | Freq: Every day | ORAL | Status: DC

## 2013-01-02 NOTE — Telephone Encounter (Signed)
Opened in Error.

## 2013-01-02 NOTE — Telephone Encounter (Signed)
NEED APPOINTMENT FOR MORE REFILLS. Fax Received. Refill Completed. Brian Parker (R.M.A)

## 2013-01-07 ENCOUNTER — Encounter (HOSPITAL_COMMUNITY)
Admission: RE | Admit: 2013-01-07 | Discharge: 2013-01-07 | Payer: BC Managed Care – PPO | Source: Ambulatory Visit | Attending: Orthopedic Surgery | Admitting: Orthopedic Surgery

## 2013-01-07 NOTE — Pre-Procedure Instructions (Signed)
20 DEJOUR VOS  01/07/2013   Your procedure is scheduled on: Tuesday, January14th.  Report to [facility] at 5:30 AM.  Call this number if you have problems the morning of surgery: [phone]   Remember:Nothing to eat or drink after Midnight.   Take these medicines the morning of surgery with A SIP OF WATER: Amlodipine (Norvasc).   Stop taking Aspirin, Coumadin, Plavix, Effient and herbal medications.  Do not take any NSAIDS ie:  Ibuprofen, Advil, Naproxen.    Do not wear jewelry, make-up or nail polish.  Do not wear lotions, powders, or perfumes. You may wear deodorant.  Do not shave 48 hours prior to surgery. Men may shave face and neck.  Do not bring valuables to the hospital.  Contacts, dentures or bridgework may not be worn into surgery.  Leave suitcase in the car. After surgery it may be brought to your room.  For patients admitted to the hospital, checkout time is 11:00 AM the day of discharge.   Patients discharged the day of surgery will not be allowed to drive home.  Name and phone number of your driver: NA    Special Instructions: Shower using CHG 2 nights before surgery and the night before surgery.  If you shower the day of surgery use CHG.  Use special wash - you have one bottle of CHG for all showers.  You should use approximately 1/3 of the bottle for each shower.    Please read over the following fact sheets that you were given: Pain Booklet, Coughing and Deep Breathing, Blood Transfusion Information and Surgical Site Infection Prevention

## 2013-01-13 ENCOUNTER — Inpatient Hospital Stay (HOSPITAL_COMMUNITY)
Admission: RE | Admit: 2013-01-13 | Payer: BC Managed Care – PPO | Source: Ambulatory Visit | Admitting: Orthopedic Surgery

## 2013-01-13 ENCOUNTER — Encounter (HOSPITAL_COMMUNITY): Admission: RE | Payer: Self-pay | Source: Ambulatory Visit

## 2013-01-13 SURGERY — ARTHROPLASTY, KNEE, TOTAL
Anesthesia: General | Site: Knee | Laterality: Left

## 2013-01-29 ENCOUNTER — Encounter: Payer: Self-pay | Admitting: Cardiovascular Disease

## 2013-01-29 ENCOUNTER — Ambulatory Visit (INDEPENDENT_AMBULATORY_CARE_PROVIDER_SITE_OTHER): Payer: BC Managed Care – PPO | Admitting: Cardiovascular Disease

## 2013-01-29 VITALS — BP 150/102 | HR 54 | Ht 70.0 in | Wt 232.0 lb

## 2013-01-29 DIAGNOSIS — I10 Essential (primary) hypertension: Secondary | ICD-10-CM

## 2013-01-29 MED ORDER — DOXAZOSIN MESYLATE 4 MG PO TABS: 4.0000 mg | ORAL_TABLET | Freq: Every day | ORAL | Status: DC

## 2013-01-29 NOTE — Patient Instructions (Addendum)
Your physician has recommended you make the following change in your medication:  INCREASE CARDURA TO 4 MG DAILY  Your physician wants you to follow-up in: 6 MONTHS  You will receive a reminder letter in the mail two months in advance. If you don't receive a letter, please call our office to schedule the follow-up appointment.  [name]

## 2013-01-29 NOTE — Progress Notes (Signed)
Brian Parker Date of Birth  21-Oct-1950 [facility]     [place] [facility]  [address]    [address]   [address] [place], [place]  [address]    [place], [place]  [address] [phone]  Fax  [phone]  [phone]  Fax [phone]  1. Hypertension 2. Non Hodgkins Lymphoma  History of Present Illness:  Brian Parker has done well from a cardiac standpoint.  He recently had XRT for a growing lymph node in his abdomen.   His BP has been well controlled at home.    January 29, 2013: He continues to have issues with his non-hodgkins lymphoma.  He had XTR and has been declared in remission .  He had a  Left leg puncture would from a drill bit - never really healed well.  He recently had this would debrided and is scheduled to have a left knee replacement once this has healed.  He has not been able to exercise because of these leg issues.   Current Outpatient Prescriptions on File Prior to Visit  Medication Sig Dispense Refill  . amLODipine (NORVASC) 5 MG tablet TAKE 1 TABLET BY MOUTH EVERY DAY  30 tablet  5  . aspirin EC 81 MG tablet Take 81 mg by mouth daily.        . cholecalciferol (VITAMIN D) 1000 UNITS tablet Take 1,000 Units by mouth daily.      [name] doxazosin (CARDURA) 4 MG tablet Take 0.5 tablets (2 mg total) by mouth at bedtime.  30 tablet  1  . Fish Oil-Krill Oil CAPS Take 1 capsule by mouth daily.      . hydrochlorothiazide (HYDRODIURIL) 25 MG tablet Take 1 tablet (25 mg total) by mouth daily.  30 tablet  5  . ibuprofen (ADVIL,MOTRIN) 200 MG tablet Take 400 mg by mouth every 6 (six) hours as needed. For pain       . LEVITRA 20 MG tablet Take 20 mg by mouth daily as needed.       . Multiple Vitamin (MULTIVITAMIN) tablet Take 1 tablet by mouth daily.        . potassium chloride (K-DUR) 10 MEQ tablet Take 1 tablet (10 mEq total) by mouth daily.  30 tablet  5  Amlodipine 5 mg a day   Allergies  Allergen Reactions  . Losartan     Stomach cramps.    Past Medical  History  Diagnosis Date  . Non Hodgkin's lymphoma   . Hypertension   . Edema of lower extremity   . Follicular lymphoma grade I of intra-abdominal lymph nodes 12/31/2006  . Elevated prostate specific antigen (PSA) 11/20/2011  . Nodular lymphoma involving intra-abdominal lymph nodes 12/20/2011  . Kidney carcinoma 03/01/2003    tumor seperate from lymphoma; found during routine screening for lymphoma  . Cancer of kidney, secondary 01/06/2010    right kidney//right kidney removed//left kidney functions WDL  . Chest pain     INTERMITTENT/15 years ago//stress induced    Past Surgical History  Procedure Date  . Nephrectomy     right kidney  . Appendectomy   . Shoulder surgery   . US echocardiography 02/26/2006    EF 55-60%  . Cardiovascular stress test 10/19/2008    EF 52%, NO ISCHEMIA  . Groin mass open biopsy     History  Smoking status  . Never Smoker   Smokeless tobacco  . Never Used    History  Alcohol Use  . 4.2 oz/week  .  6 Glasses of wine, 1 Cans of beer per week    Family History  Problem Relation Age of Onset  . Uterine cancer Mother   . Cancer Mother     cervical  . Coronary artery disease Father   . Hypertension Father     Reviw of Systems:  Reviewed in the HPI.  All other systems are negative.  Physical Exam: Blood pressure 150/102, pulse 54, height 5\' 10"  (1.778 m), weight 232 lb (105.235 kg). General: Well developed, well nourished, in no acute distress.  Head: Normocephalic, atraumatic, sclera non-icteric, mucus membranes are moist,   Neck: Supple. Negative for carotid bruits. JVD not elevated.  Lungs: Clear bilaterally to auscultation without wheezes, rales, or rhonchi. Breathing is unlabored.  Heart: RRR with S1 S2. No murmurs, rubs, or gallops appreciated.  Abdomen: Soft, non-tender, non-distended with normoactive bowel sounds. No hepatomegaly. No rebound/guarding. No obvious abdominal masses.  Msk:  Strength and tone appear normal for  age.  Extremities: No clubbing or cyanosis. No edema.  Distal pedal pulses are 2+ and equal bilaterally.  Neuro: Alert and oriented X 3. Moves all extremities spontaneously.  Psych:  Responds to questions appropriately with a normal affect.  ECG: January 29, 3013:  Sinus brady at 50. No ST or T wave changes.  Assessment / Plan:

## 2013-01-29 NOTE — Assessment & Plan Note (Signed)
Blood pressure remains elevated. We will increase his Cardura to 4 mg a day. I am hesitant to increase his amlodipine as this will cause more leg edema. His heart rate is already very slow so I dont want to start a beta blocker.

## 2013-02-09 ENCOUNTER — Other Ambulatory Visit: Payer: Self-pay | Admitting: Orthopedic Surgery

## 2013-03-02 ENCOUNTER — Other Ambulatory Visit (HOSPITAL_BASED_OUTPATIENT_CLINIC_OR_DEPARTMENT_OTHER): Payer: BC Managed Care – PPO | Admitting: Lab

## 2013-03-02 ENCOUNTER — Encounter (HOSPITAL_COMMUNITY): Payer: Self-pay

## 2013-03-02 ENCOUNTER — Ambulatory Visit (HOSPITAL_COMMUNITY)
Admission: RE | Admit: 2013-03-02 | Discharge: 2013-03-02 | Disposition: A | Discharge status: Home / Self Care | Payer: BC Managed Care – PPO | Source: Ambulatory Visit | Attending: Oncology | Admitting: Oncology

## 2013-03-02 DIAGNOSIS — C8589 Other specified types of non-Hodgkin lymphoma, extranodal and solid organ sites: Secondary | ICD-10-CM | POA: Insufficient documentation

## 2013-03-02 DIAGNOSIS — Z85528 Personal history of other malignant neoplasm of kidney: Secondary | ICD-10-CM | POA: Insufficient documentation

## 2013-03-02 DIAGNOSIS — R161 Splenomegaly, not elsewhere classified: Secondary | ICD-10-CM | POA: Insufficient documentation

## 2013-03-02 DIAGNOSIS — C8293 Follicular lymphoma, unspecified, intra-abdominal lymph nodes: Secondary | ICD-10-CM

## 2013-03-02 DIAGNOSIS — C649 Malignant neoplasm of unspecified kidney, except renal pelvis: Secondary | ICD-10-CM

## 2013-03-02 DIAGNOSIS — K5730 Diverticulosis of large intestine without perforation or abscess without bleeding: Secondary | ICD-10-CM | POA: Insufficient documentation

## 2013-03-02 DIAGNOSIS — K7689 Other specified diseases of liver: Secondary | ICD-10-CM | POA: Insufficient documentation

## 2013-03-02 DIAGNOSIS — R188 Other ascites: Secondary | ICD-10-CM | POA: Insufficient documentation

## 2013-03-02 DIAGNOSIS — Z905 Acquired absence of kidney: Secondary | ICD-10-CM | POA: Insufficient documentation

## 2013-03-02 LAB — CBC WITH DIFFERENTIAL/PLATELET
Basophils Absolute: 0 10*3/uL (ref 0.0–0.1)
HCT: 44.4 % (ref 38.4–49.9)
HGB: 15.3 g/dL (ref 13.0–17.1)
MONO#: 0.3 10*3/uL (ref 0.1–0.9)
NEUT#: 2.8 10*3/uL (ref 1.5–6.5)
NEUT%: 70.8 % (ref 39.0–75.0)
RDW: 13.3 % (ref 11.0–14.6)
WBC: 4 10*3/uL (ref 4.0–10.3)
lymph#: 0.7 10*3/uL — ABNORMAL LOW (ref 0.9–3.3)

## 2013-03-02 LAB — COMPREHENSIVE METABOLIC PANEL (CC13)
ALT: 20 U/L (ref 0–55)
AST: 21 U/L (ref 5–34)
Albumin: 3.9 g/dL (ref 3.5–5.0)
BUN: 16.7 mg/dL (ref 7.0–26.0)
CO2: 27 mEq/L (ref 22–29)
Calcium: 9.1 mg/dL (ref 8.4–10.4)
Chloride: 105 mEq/L (ref 98–107)
Creatinine: 1.1 mg/dL (ref 0.7–1.3)
Potassium: 3.9 mEq/L (ref 3.5–5.1)

## 2013-03-02 LAB — LACTATE DEHYDROGENASE (CC13): LDH: 199 U/L (ref 125–245)

## 2013-03-03 ENCOUNTER — Telehealth: Payer: Self-pay | Admitting: *Deleted

## 2013-03-03 NOTE — Telephone Encounter (Signed)
Spoke with wife, Harriett Sine.  Let her know that CT was negative for lymphoma.  She very much appreciated the call.  He will keep his appt. On Friday 3/7

## 2013-03-06 ENCOUNTER — Telehealth: Payer: Self-pay | Admitting: Oncology

## 2013-03-06 ENCOUNTER — Encounter (HOSPITAL_COMMUNITY): Payer: Self-pay | Admitting: Pharmacy Technician

## 2013-03-06 ENCOUNTER — Ambulatory Visit (HOSPITAL_BASED_OUTPATIENT_CLINIC_OR_DEPARTMENT_OTHER): Payer: BC Managed Care – PPO | Admitting: Oncology

## 2013-03-06 VITALS — BP 146/95 | HR 82 | Temp 97.7°F | Resp 20 | Ht 70.0 in | Wt 233.3 lb

## 2013-03-06 DIAGNOSIS — C649 Malignant neoplasm of unspecified kidney, except renal pelvis: Secondary | ICD-10-CM

## 2013-03-06 DIAGNOSIS — I10 Essential (primary) hypertension: Secondary | ICD-10-CM

## 2013-03-06 DIAGNOSIS — C8293 Follicular lymphoma, unspecified, intra-abdominal lymph nodes: Secondary | ICD-10-CM

## 2013-03-06 DIAGNOSIS — R972 Elevated prostate specific antigen [PSA]: Secondary | ICD-10-CM

## 2013-03-06 DIAGNOSIS — C641 Malignant neoplasm of right kidney, except renal pelvis: Secondary | ICD-10-CM

## 2013-03-06 DIAGNOSIS — D492 Neoplasm of unspecified behavior of bone, soft tissue, and skin: Secondary | ICD-10-CM

## 2013-03-06 DIAGNOSIS — C7901 Secondary malignant neoplasm of right kidney and renal pelvis: Secondary | ICD-10-CM

## 2013-03-06 NOTE — Progress Notes (Signed)
Hematology and Oncology Follow Up Visit  Brian Parker [id number] [birth year] 62 y.o. 03/06/2013 1:04 PM   Principle Diagnosis: Encounter Diagnoses  Name Primary?  . Cancer of kidney, secondary, right   . Elevated prostate specific antigen (PSA)   . Kidney carcinoma, right   . Nodular lymphoma involving intra-abdominal lymph nodes Yes     Interim History:   Followup visit for this 63 year old man initially diagnosed with a follicular grade 1 B-cell, non-Hodgkin's lymphoma in 2003 . He was treated with 8 cycles of CHOP/Rituxan through July 2003. He was started on maintenance rituximab but only had 2 cycles due to development of delayed neutropenia. There was suspicion for early progression on a CT scan done in July of 2009 which showed a single area of soft tissue density in the left periaortic region. This area remained stable on serial scans until a study done on 11/13/2011 which did show clear regrowth in this area enlarging from 2.6 x 0.9 cm to approximately 4.9 x 3.4 cm. A PET scan was done on 11/30/2011 which showed a single area of markedly abnormal activity with SUV up to 10.3 over the abnormal area of lymphadenopathy seen on CT scan. There were no other areas of activity.  Given this fact, I felt it would be reasonable to offer him involved field radiation. I discussed this with Dr.Rizzieri at [facility] who has been comanaging the patient with me. He agreed with this plan.  He was evaluated by Dr. Chipper Herb and received 3000 cGy in 15 fractions to the left para-aortic lymph node mass between January 16 and 02/04/2012. He tolerated treatment well.  A post treatment CT scan done 04/29/2012 which I personally reviewed shows a dramatic reduction in the lymph node mass with minimal residual soft tissue density. Precise measurements are difficult to make since the mass overlies the aorta but radiologist measures this as 1.5 x 1 cm.  A followup study  on 09/03/2012  residual but minimal soft tissue  density in the area of previous radiation treatment field, left periaortic, unchanged compared with the April study. No other areas of adenopathy. Spleen is normal. Scan done in anticipation of today's visit on 03/02/2013 which I personally reviewed, remains stable compared with prior study with minimal residual soft tissue density in the area of previously radiated periaortic lymph node mass. Spleen now been read as mildly enlarged at 15.5 cm in AP dimensions but this is totally unconvincing in my opinion. I got almost the identical measurement from the April scan when I measured the spleen in the same way the radiologist measured it.   Medications: reviewed  Allergies:  Allergies  Allergen Reactions  . Losartan     Stomach cramps.    Review of Systems: Constitutional:   He feels great Respiratory: No cardiorespiratory complaints Cardiovascular:   Gastrointestinal: No change in bowel habit Genito-Urinary: No urinary tract symptoms Musculoskeletal: He is having progressive problems with his left knee and has a knee replacement surgery scheduled for this month Neurologic: No headache or change in vision Skin: He has noted increasing number of small, hyperpigmented, spots between his great toe and second toe on the left foot. He called some freckles and says they've been there for years but now they're more of them and his orthopedic surgeon noticed them during his exam. He had recent local surgery to help repair a wound sustained when he inadvertently stuck himself with a electric drill Remaining ROS negative.  Physical Exam: Blood pressure 146/95, pulse 82,  temperature 97.7 F (36.5 C), temperature source Oral, resp. rate 20, height 5\' 10"  (1.778 m), weight 233 lb 4.8 oz (105.824 kg). Wt Readings from Last 3 Encounters:  03/06/13 233 lb 4.8 oz (105.824 kg)  01/29/13 232 lb (105.235 kg)  09/05/12 217 lb 6.4 oz (98.612 kg)     General appearance: Well-nourished Caucasian man. He has  gained back some of the weight that he lost on a crash diet and looks healthier than he did at time of his last visit  HENNT: Pharynx no erythema or exudate Lymph nodes: No cervical, supraclavicular, axillary, or inguinal adenopathy Breasts: Lungs: Clear to auscultation resonant to percussion Heart: Regular rhythm no murmur Abdomen: Soft, nontender, no mass, no organomegaly Extremities: No edema, no calf tenderness Vascular: No cyanosis Neurologic: Motor strength 5 over 5, reflexes 1+ symmetric Skin: Tiny 1 mm sized hyperpigmented macules between the great toe and second toe of his left foot approximately 10 spots.  Lab Results: Lab Results  Component Value Date   WBC 4.0 03/02/2013   HGB 15.3 03/02/2013   HCT 44.4 03/02/2013   MCV 88.3 03/02/2013   PLT 134 03/02/2013     Chemistry      Component Value Date/Time   NA 141 03/02/2013 0818   NA 142 04/29/2012 0906   NA 140 12/11/2011 1135   K 3.9 03/02/2013 0818   K 4.3 04/29/2012 0906   K 3.6 12/11/2011 1135   CL 105 03/02/2013 0818   CL 99 04/29/2012 0906   CL 104 12/11/2011 1135   CO2 27 03/02/2013 0818   CO2 28 04/29/2012 0906   CO2 27 12/11/2011 1135   BUN 16.7 03/02/2013 0818   BUN 18 04/29/2012 0906   BUN 18 12/11/2011 1135   CREATININE 1.1 03/02/2013 0818   CREATININE 1.3* 04/29/2012 0906   CREATININE 1.02 12/11/2011 1135      Component Value Date/Time   CALCIUM 9.1 03/02/2013 0818   CALCIUM 8.6 04/29/2012 0906   CALCIUM 9.5 12/11/2011 1135   ALKPHOS 62 03/02/2013 0818   ALKPHOS 56 04/29/2012 0906   ALKPHOS 60 11/13/2011 0810   AST 21 03/02/2013 0818   AST 23 04/29/2012 0906   AST 21 11/13/2011 0810   ALT 20 03/02/2013 0818   ALT 17 11/13/2011 0810   BILITOT 0.58 03/02/2013 0818   BILITOT 0.90 04/29/2012 0906   BILITOT 0.5 11/13/2011 0810       Radiological Studies: Ct Abdomen Pelvis Wo Contrast  03/02/2013  *RADIOLOGY REPORT*  Clinical Data:  History of non-Hodgkins lymphoma.  History of kidney cancer status post nephrectomy.  CT CHEST,  ABDOMEN AND PELVIS WITHOUT CONTRAST  Technique:  Multidetector CT imaging of the chest, abdomen and pelvis was performed following the standard protocol without IV contrast.  Comparison:  CT of the abdomen and pelvis 09/03/2012.  CT CHEST  Findings:  Mediastinum: Heart size is normal. There is no significant pericardial fluid, thickening or pericardial calcification. No pathologically enlarged mediastinal or hilar lymph nodes. Please note that accurate exclusion of hilar adenopathy is limited on noncontrast CT scans.  The esophagus is unremarkable in appearance.  Lungs/Pleura: Linear opacity in the periphery of the right lower lobe is unchanged and most compatible with an area of mild scarring.  No suspicious appearing pulmonary nodules or masses are identified.  No acute consolidative airspace disease.  No pleural effusions.  Musculoskeletal: There are no aggressive appearing lytic or blastic lesions noted in the visualized portions of the skeleton.  IMPRESSION:  1.  No findings to suggest lymphoma recurrence in the thorax. 2.  No suspicious appearing pulmonary nodules or other findings to suggest metastatic disease from renal cancer in the thorax.  CT ABDOMEN AND PELVIS  Findings:  Abdomen/Pelvis: Status post right nephrectomy.  No abnormal soft tissue mass in the right retroperitoneum to suggest local recurrence of disease. There are again numerous reactive sized and minimally enlarged retroperitoneal lymph nodes, similar to prior examinations.  The largest retroperitoneal lymph node is intimately associated with the infrarenal abdominal aorta measuring up to 2.0 x 1.1 cm (image 76 of series 2).  Numerous prominent but non enlarged pelvic lymph nodes are again noted, largest of which is in the right external iliac station measuring only 8 mm in short axis.  Small subcentimeter low attenuation lesions in the right lobe of the liver are unchanged, and although incompletely characterized on today's noncontrast CT  scan, are favored to represent small cysts. The unenhanced appearance of the liver, pancreas, bilateral adrenal glands and the left kidney is unremarkable.  The spleen is mildly enlarged measuring 15.5 cm AP.  A trace volume of ascites in the lower anatomic pelvis, decreased compared to the prior examination. Numerous colonic diverticula are noted.  Given the presence of ascites in the low pelvis, the possibility of a distal sigmoid diverticulitis is not excluded, however, this is not strongly favored (clinical correlation is required).  No larger volume of ascites.  No pneumoperitoneum.  No pathologic distension of small bowel.  Musculoskeletal: There are no aggressive appearing lytic or blastic lesions noted in the visualized portions of the skeleton. Postoperative changes of mesh repair for ventral hernia are redemonstrated with focal laxity in the region of the umbilicus (unchanged).  IMPRESSION:  1.  Stable examination demonstrating numerous prominent but nonenlarged retroperitoneal and pelvic lymph nodes, in addition to one minimally enlarged periaortic lymph node, as detailed above. 2.  Status post right nephrectomy without evidence to suggest local recurrence of disease. 3.  Colonic diverticulosis.  This is most pronounced in the region of the sigmoid colon, and there is a trace volume of ascites and slight indistinctness in the adjacent soft tissues.  The possibility of acute diverticulitis is not excluded because of these findings, however, this is very similar to the prior study, an acute diverticulitis is not strongly favored on the basis of these findings alone.  Clinical correlation is recommended. 4.  Additional incidental findings, similar to prior examinations, as above.   Original Report Authenticated By: Trudie Reed, M.D.       Impression and Plan: #1. Localized relapse of low-grade B-cell non-Hodgkin's lymphoma Complete response to radiation therapy given one year ago. No evidence for  new or progressive disease at this time. Plan: I'll see him again in 6 months and repeat CT scans prior to that visit.   #2. Metachronous primary right kidney cancers, initial 1.4 cm T1a lesion status post partial nephrectomy March 2004. Second primary 1.3 cm complex lesion, status post right nephrectomy 01/06/10 with findings of a 1.5 cm grade 3 of 4 renal cell carcinoma with invasion into the renal capsule but no vascular invasion.   #3. History of chronically elevated PSA in the range of 6 units. Repetitive biopsies to date negative for cancer. Ongoing follow up with his urologist.   #4. Essential hypertension. Currently on diuretic alone  #5. Hyperpigmented macules between the toes of his left foot. I do think he needs to have a dermatologist evaluate these lesions. They do look suspicious for in  situ melanoma. He believes he has seeing Dr. Sharyn Lull in the past. He will call and get an appointment.   CC:. Dr. Sigmund Hazel; Dr. Wylene Simmer; Dr. Chipper Herb; Dr. Betsy Coder; Dr. Rhea Pink   Levert Feinstein, MD 3/7/20141:04 PM

## 2013-03-06 NOTE — Telephone Encounter (Signed)
Called pt and left message regarding appt for lab before CT September 2014, instructed pt to pick up oral contrast for CT. Pt will see MD after a few days

## 2013-03-09 ENCOUNTER — Inpatient Hospital Stay (HOSPITAL_COMMUNITY): Admission: RE | Admit: 2013-03-09 | Payer: BC Managed Care – PPO | Source: Ambulatory Visit

## 2013-03-16 ENCOUNTER — Encounter (HOSPITAL_COMMUNITY): Payer: Self-pay

## 2013-03-16 ENCOUNTER — Encounter (HOSPITAL_COMMUNITY)
Admission: RE | Admit: 2013-03-16 | Discharge: 2013-03-16 | Disposition: A | Discharge status: Home / Self Care | Payer: BC Managed Care – PPO | Source: Ambulatory Visit | Attending: Orthopedic Surgery | Admitting: Orthopedic Surgery

## 2013-03-16 ENCOUNTER — Ambulatory Visit (HOSPITAL_COMMUNITY)
Admission: RE | Admit: 2013-03-16 | Discharge: 2013-03-16 | Disposition: A | Discharge status: Home / Self Care | Payer: BC Managed Care – PPO | Source: Ambulatory Visit | Attending: Orthopedic Surgery | Admitting: Orthopedic Surgery

## 2013-03-16 DIAGNOSIS — Z01811 Encounter for preprocedural respiratory examination: Secondary | ICD-10-CM | POA: Insufficient documentation

## 2013-03-16 DIAGNOSIS — Z01818 Encounter for other preprocedural examination: Secondary | ICD-10-CM | POA: Insufficient documentation

## 2013-03-16 DIAGNOSIS — Z01812 Encounter for preprocedural laboratory examination: Secondary | ICD-10-CM | POA: Insufficient documentation

## 2013-03-16 LAB — PROTIME-INR
INR: 0.91 (ref 0.00–1.49)
Prothrombin Time: 12.2 seconds (ref 11.6–15.2)

## 2013-03-16 LAB — CBC
MCH: 29.5 pg (ref 26.0–34.0)
MCHC: 34.6 g/dL (ref 30.0–36.0)
Platelets: 134 10*3/uL — ABNORMAL LOW (ref 150–400)
RBC: 4.98 MIL/uL (ref 4.22–5.81)

## 2013-03-16 LAB — URINALYSIS, ROUTINE W REFLEX MICROSCOPIC
Bilirubin Urine: NEGATIVE
Glucose, UA: NEGATIVE mg/dL
Hgb urine dipstick: NEGATIVE
Ketones, ur: NEGATIVE mg/dL
Protein, ur: NEGATIVE mg/dL

## 2013-03-16 LAB — BASIC METABOLIC PANEL
Calcium: 9.7 mg/dL (ref 8.4–10.5)
GFR calc non Af Amer: 75 mL/min — ABNORMAL LOW (ref >90)
Glucose, Bld: 115 mg/dL — ABNORMAL HIGH (ref 70–99)
Sodium: 141 mEq/L (ref 135–145)

## 2013-03-16 LAB — SURGICAL PCR SCREEN: Staphylococcus aureus: NEGATIVE

## 2013-03-16 LAB — TYPE AND SCREEN: ABO/RH(D): O NEG

## 2013-03-16 NOTE — Pre-Procedure Instructions (Signed)
Brian Parker  03/16/2013   Your procedure is scheduled on:  03-24-2013  Report to [facility] at 8:45 AM.  Call this number if you have problems the morning of surgery: [phone]   Remember:   Do not eat food or drink liquids after midnight.   Take these medicines the morning of surgery with A SIP OF WATER: amlodipine(Norvasc),doxazosin(cardura),   Do not wear jewelry Do not wear lotions, powders, or perfumes. You may wear deodorant.  Do not shave 48 hours prior to surgery. Men may shave face and neck.  Do not bring valuables to the hospital.  Contacts, dentures or bridgework may not be worn into surgery.  Leave suitcase in the car. After surgery it may be brought to your room.   For patients admitted to the hospital, checkout time is 11:00 AM the day of discharge.   Patients discharged the day of surgery will not be allowed to drive home.    Special Instructions: Shower using CHG 2 nights before surgery and the night before surgery.  If you shower the day of surgery use CHG.  Use special wash - you have one bottle of CHG for all showers.  You should use approximately 1/3 of the bottle for each shower.   Please read over the following fact sheets that you were given: Pain Booklet, Coughing and Deep Breathing, Blood Transfusion Information and Surgical Site Infection Prevention

## 2013-03-17 LAB — URINE CULTURE
Colony Count: NO GROWTH
Culture: NO GROWTH

## 2013-03-24 ENCOUNTER — Encounter (HOSPITAL_COMMUNITY): Payer: Self-pay

## 2013-03-24 ENCOUNTER — Encounter (HOSPITAL_COMMUNITY): Payer: Self-pay | Admitting: Certified Registered"

## 2013-03-24 ENCOUNTER — Inpatient Hospital Stay (HOSPITAL_COMMUNITY)
Admission: RE | Admit: 2013-03-24 | Discharge: 2013-03-27 | DRG: 209 | Disposition: A | Discharge status: Home Health | Payer: BC Managed Care – PPO | Source: Ambulatory Visit | Attending: Orthopedic Surgery | Admitting: Orthopedic Surgery

## 2013-03-24 ENCOUNTER — Ambulatory Visit (HOSPITAL_COMMUNITY): Payer: BC Managed Care – PPO | Admitting: Certified Registered"

## 2013-03-24 ENCOUNTER — Encounter (HOSPITAL_COMMUNITY)
Admission: RE | Disposition: A | Discharge status: Home Health | Payer: Self-pay | Source: Ambulatory Visit | Attending: Orthopedic Surgery

## 2013-03-24 DIAGNOSIS — Z79899 Other long term (current) drug therapy: Secondary | ICD-10-CM

## 2013-03-24 DIAGNOSIS — I10 Essential (primary) hypertension: Secondary | ICD-10-CM | POA: Diagnosis present

## 2013-03-24 DIAGNOSIS — Z7982 Long term (current) use of aspirin: Secondary | ICD-10-CM

## 2013-03-24 DIAGNOSIS — M1712 Unilateral primary osteoarthritis, left knee: Secondary | ICD-10-CM

## 2013-03-24 DIAGNOSIS — M1710 Unilateral primary osteoarthritis, unspecified knee: Principal | ICD-10-CM | POA: Diagnosis present

## 2013-03-24 DIAGNOSIS — Z85528 Personal history of other malignant neoplasm of kidney: Secondary | ICD-10-CM

## 2013-03-24 DIAGNOSIS — C8293 Follicular lymphoma, unspecified, intra-abdominal lymph nodes: Secondary | ICD-10-CM | POA: Diagnosis present

## 2013-03-24 HISTORY — PX: TOTAL KNEE ARTHROPLASTY: SHX125

## 2013-03-24 SURGERY — ARTHROPLASTY, KNEE, TOTAL
Anesthesia: General | Site: Knee | Laterality: Left | Wound class: Clean

## 2013-03-24 MED ORDER — PROPOFOL 10 MG/ML IV BOLUS
INTRAVENOUS | Status: DC | PRN
  Administered 2013-03-24: 50 mg via INTRAVENOUS
  Administered 2013-03-24: 200 mg via INTRAVENOUS

## 2013-03-24 MED ORDER — POTASSIUM CHLORIDE IN NACL 20-0.9 MEQ/L-% IV SOLN
INTRAVENOUS | Status: AC
  Administered 2013-03-24: 17:00:00 via INTRAVENOUS
  Filled 2013-03-24 (×2): qty 1000

## 2013-03-24 MED ORDER — ONDANSETRON HCL 4 MG/2ML IJ SOLN: 4.0000 mg | Freq: Four times a day (QID) | INTRAMUSCULAR | Status: DC | PRN

## 2013-03-24 MED ORDER — EPHEDRINE SULFATE 50 MG/ML IJ SOLN
INTRAMUSCULAR | Status: DC | PRN
  Administered 2013-03-24: 5 mg via INTRAVENOUS
  Administered 2013-03-24: 10 mg via INTRAVENOUS

## 2013-03-24 MED ORDER — HYDROMORPHONE HCL PF 1 MG/ML IJ SOLN: 0.5000 mg | INTRAMUSCULAR | Status: DC | PRN

## 2013-03-24 MED ORDER — METOCLOPRAMIDE HCL 10 MG PO TABS: 5.0000 mg | ORAL_TABLET | Freq: Three times a day (TID) | ORAL | Status: DC | PRN

## 2013-03-24 MED ORDER — OXYCODONE HCL 5 MG PO TABS: 5.0000 mg | ORAL_TABLET | ORAL | Status: DC | PRN

## 2013-03-24 MED ORDER — LACTATED RINGERS IV SOLN
INTRAVENOUS | Status: DC
  Administered 2013-03-24 (×2): via INTRAVENOUS

## 2013-03-24 MED ORDER — BUPIVACAINE HCL (PF) 0.25 % IJ SOLN
INTRAMUSCULAR | Status: AC
  Filled 2013-03-24: qty 30

## 2013-03-24 MED ORDER — POTASSIUM CHLORIDE ER 10 MEQ PO TBCR
10.0000 meq | EXTENDED_RELEASE_TABLET | Freq: Every day | ORAL | Status: DC
  Administered 2013-03-25 – 2013-03-26 (×2): 10 meq via ORAL
  Filled 2013-03-24 (×3): qty 1

## 2013-03-24 MED ORDER — HYDROMORPHONE HCL PF 1 MG/ML IJ SOLN
0.2500 mg | INTRAMUSCULAR | Status: DC | PRN
  Administered 2013-03-24 (×4): 0.5 mg via INTRAVENOUS

## 2013-03-24 MED ORDER — WARFARIN - PHARMACIST DOSING INPATIENT: Freq: Every day | Status: DC

## 2013-03-24 MED ORDER — OXYCODONE HCL 5 MG PO TABS
ORAL_TABLET | ORAL | Status: AC
  Administered 2013-03-24: 5 mg
  Filled 2013-03-24: qty 1

## 2013-03-24 MED ORDER — 0.9 % SODIUM CHLORIDE (POUR BTL) OPTIME
TOPICAL | Status: DC | PRN
  Administered 2013-03-24: 1000 mL

## 2013-03-24 MED ORDER — CHLORHEXIDINE GLUCONATE 4 % EX LIQD: 60.0000 mL | Freq: Once | CUTANEOUS | Status: DC

## 2013-03-24 MED ORDER — CEFAZOLIN SODIUM 1-5 GM-% IV SOLN
1.0000 g | Freq: Three times a day (TID) | INTRAVENOUS | Status: AC
  Administered 2013-03-24 – 2013-03-25 (×2): 1 g via INTRAVENOUS
  Filled 2013-03-24 (×3): qty 50

## 2013-03-24 MED ORDER — ONDANSETRON HCL 4 MG PO TABS: 4.0000 mg | ORAL_TABLET | Freq: Four times a day (QID) | ORAL | Status: DC | PRN

## 2013-03-24 MED ORDER — FENTANYL CITRATE 0.05 MG/ML IJ SOLN: 50.0000 ug | INTRAMUSCULAR | Status: DC | PRN

## 2013-03-24 MED ORDER — PHENOL 1.4 % MT LIQD
1.0000 | OROMUCOSAL | Status: DC | PRN
Start: 2013-03-24 — End: 2013-03-27

## 2013-03-24 MED ORDER — ONDANSETRON HCL 4 MG/2ML IJ SOLN
4.0000 mg | Freq: Four times a day (QID) | INTRAMUSCULAR | Status: DC | PRN
  Administered 2013-03-24 – 2013-03-25 (×3): 4 mg via INTRAVENOUS
  Filled 2013-03-24 (×3): qty 2

## 2013-03-24 MED ORDER — BUPIVACAINE HCL (PF) 0.25 % IJ SOLN
INTRAMUSCULAR | Status: DC | PRN
  Administered 2013-03-24: 15 mL

## 2013-03-24 MED ORDER — ONDANSETRON HCL 4 MG/2ML IJ SOLN
INTRAMUSCULAR | Status: DC | PRN
  Administered 2013-03-24: 4 mg via INTRAVENOUS

## 2013-03-24 MED ORDER — METOCLOPRAMIDE HCL 5 MG/ML IJ SOLN: 5.0000 mg | Freq: Three times a day (TID) | INTRAMUSCULAR | Status: DC | PRN

## 2013-03-24 MED ORDER — NEOSTIGMINE METHYLSULFATE 1 MG/ML IJ SOLN
INTRAMUSCULAR | Status: DC | PRN
  Administered 2013-03-24: 3 mg via INTRAVENOUS

## 2013-03-24 MED ORDER — HYDROMORPHONE HCL PF 1 MG/ML IJ SOLN
INTRAMUSCULAR | Status: AC
  Filled 2013-03-24: qty 2

## 2013-03-24 MED ORDER — ONDANSETRON HCL 4 MG/2ML IJ SOLN: 4.0000 mg | Freq: Once | INTRAMUSCULAR | Status: DC | PRN

## 2013-03-24 MED ORDER — WARFARIN SODIUM 7.5 MG PO TABS
7.5000 mg | ORAL_TABLET | Freq: Once | ORAL | Status: AC
  Administered 2013-03-24: 7.5 mg via ORAL
  Filled 2013-03-24: qty 1

## 2013-03-24 MED ORDER — NALOXONE HCL 0.4 MG/ML IJ SOLN: 0.4000 mg | INTRAMUSCULAR | Status: DC | PRN

## 2013-03-24 MED ORDER — FENTANYL CITRATE 0.05 MG/ML IJ SOLN
INTRAMUSCULAR | Status: AC
  Filled 2013-03-24: qty 2

## 2013-03-24 MED ORDER — DIPHENHYDRAMINE HCL 12.5 MG/5ML PO ELIX: 12.5000 mg | ORAL_SOLUTION | Freq: Four times a day (QID) | ORAL | Status: DC | PRN

## 2013-03-24 MED ORDER — DEXAMETHASONE SODIUM PHOSPHATE 10 MG/ML IJ SOLN
INTRAMUSCULAR | Status: DC | PRN
  Administered 2013-03-24: 8 mg via INTRAVENOUS

## 2013-03-24 MED ORDER — MIDAZOLAM HCL 2 MG/2ML IJ SOLN
INTRAMUSCULAR | Status: AC
  Filled 2013-03-24: qty 2

## 2013-03-24 MED ORDER — AMLODIPINE BESYLATE 5 MG PO TABS
5.0000 mg | ORAL_TABLET | Freq: Every day | ORAL | Status: DC
  Administered 2013-03-26: 5 mg via ORAL
  Filled 2013-03-24 (×3): qty 1

## 2013-03-24 MED ORDER — CEFAZOLIN SODIUM-DEXTROSE 2-3 GM-% IV SOLR
INTRAVENOUS | Status: AC
  Administered 2013-03-24: 2 g via INTRAVENOUS
  Filled 2013-03-24: qty 50

## 2013-03-24 MED ORDER — CLONIDINE HCL (ANALGESIA) 100 MCG/ML EP SOLN
EPIDURAL | Status: DC | PRN
  Administered 2013-03-24: 1 mL

## 2013-03-24 MED ORDER — MORPHINE SULFATE 4 MG/ML IJ SOLN
INTRAMUSCULAR | Status: AC
  Filled 2013-03-24: qty 2

## 2013-03-24 MED ORDER — ACETAMINOPHEN 650 MG RE SUPP: 650.0000 mg | Freq: Four times a day (QID) | RECTAL | Status: DC | PRN

## 2013-03-24 MED ORDER — WARFARIN VIDEO: 1.0000 | Freq: Once | Status: DC

## 2013-03-24 MED ORDER — HYDROCHLOROTHIAZIDE 25 MG PO TABS
25.0000 mg | ORAL_TABLET | Freq: Every day | ORAL | Status: DC
Start: 2013-03-25 — End: 2013-03-27
  Administered 2013-03-26: 25 mg via ORAL
  Filled 2013-03-24 (×3): qty 1

## 2013-03-24 MED ORDER — MIDAZOLAM HCL 2 MG/2ML IJ SOLN: 1.0000 mg | INTRAMUSCULAR | Status: DC | PRN

## 2013-03-24 MED ORDER — MORPHINE SULFATE (PF) 1 MG/ML IV SOLN
INTRAVENOUS | Status: AC
  Filled 2013-03-24: qty 25

## 2013-03-24 MED ORDER — SODIUM CHLORIDE 0.9 % IJ SOLN: 9.0000 mL | INTRAMUSCULAR | Status: DC | PRN

## 2013-03-24 MED ORDER — COUMADIN BOOK
1.0000 | Freq: Once | Status: AC
  Administered 2013-03-24: 1
  Filled 2013-03-24: qty 1

## 2013-03-24 MED ORDER — ACETAMINOPHEN 10 MG/ML IV SOLN
INTRAVENOUS | Status: AC
  Administered 2013-03-24: 1000 mg via INTRAVENOUS
  Filled 2013-03-24: qty 100

## 2013-03-24 MED ORDER — MORPHINE SULFATE 4 MG/ML IJ SOLN
INTRAMUSCULAR | Status: DC | PRN
  Administered 2013-03-24: 8 mg

## 2013-03-24 MED ORDER — LIDOCAINE HCL (CARDIAC) 20 MG/ML IV SOLN
INTRAVENOUS | Status: DC | PRN
  Administered 2013-03-24: 70 mg via INTRAVENOUS

## 2013-03-24 MED ORDER — ROCURONIUM BROMIDE 100 MG/10ML IV SOLN
INTRAVENOUS | Status: DC | PRN
  Administered 2013-03-24: 50 mg via INTRAVENOUS

## 2013-03-24 MED ORDER — CLONIDINE HCL (ANALGESIA) 100 MCG/ML EP SOLN
150.0000 ug | Freq: Once | EPIDURAL | Status: DC
  Filled 2013-03-24: qty 1.5

## 2013-03-24 MED ORDER — MENTHOL 3 MG MT LOZG: 1.0000 | LOZENGE | OROMUCOSAL | Status: DC | PRN

## 2013-03-24 MED ORDER — MORPHINE SULFATE (PF) 1 MG/ML IV SOLN
INTRAVENOUS | Status: DC
  Administered 2013-03-24: via INTRAVENOUS
  Administered 2013-03-24: 17 mg via INTRAVENOUS
  Administered 2013-03-24: 25 mL via INTRAVENOUS
  Administered 2013-03-24: 6 mg via INTRAVENOUS
  Administered 2013-03-25: 15 mg via INTRAVENOUS
  Filled 2013-03-24: qty 25

## 2013-03-24 MED ORDER — ACETAMINOPHEN 325 MG PO TABS
650.0000 mg | ORAL_TABLET | Freq: Four times a day (QID) | ORAL | Status: DC | PRN
  Administered 2013-03-27: 650 mg via ORAL
  Filled 2013-03-24: qty 2

## 2013-03-24 MED ORDER — ARTIFICIAL TEARS OP OINT
TOPICAL_OINTMENT | OPHTHALMIC | Status: DC | PRN
  Administered 2013-03-24: 1 via OPHTHALMIC

## 2013-03-24 MED ORDER — DOXAZOSIN MESYLATE 4 MG PO TABS
4.0000 mg | ORAL_TABLET | Freq: Every day | ORAL | Status: DC
  Administered 2013-03-24: 2 mg via ORAL
  Filled 2013-03-24 (×2): qty 1

## 2013-03-24 MED ORDER — GLYCOPYRROLATE 0.2 MG/ML IJ SOLN
INTRAMUSCULAR | Status: DC | PRN
  Administered 2013-03-24: 0.4 mg via INTRAVENOUS

## 2013-03-24 MED ORDER — ASPIRIN EC 81 MG PO TBEC
81.0000 mg | DELAYED_RELEASE_TABLET | Freq: Every day | ORAL | Status: DC
  Administered 2013-03-24 – 2013-03-26 (×3): 81 mg via ORAL
  Filled 2013-03-24 (×4): qty 1

## 2013-03-24 MED ORDER — DIPHENHYDRAMINE HCL 50 MG/ML IJ SOLN: 12.5000 mg | Freq: Four times a day (QID) | INTRAMUSCULAR | Status: DC | PRN

## 2013-03-24 MED ORDER — FENTANYL CITRATE 0.05 MG/ML IJ SOLN
INTRAMUSCULAR | Status: DC | PRN
  Administered 2013-03-24: 100 ug via INTRAVENOUS
  Administered 2013-03-24: 200 ug via INTRAVENOUS
  Administered 2013-03-24 (×2): 50 ug via INTRAVENOUS
  Administered 2013-03-24: 100 ug via INTRAVENOUS

## 2013-03-24 SURGICAL SUPPLY — 74 items
BANDAGE ELASTIC 4 VELCRO ST LF (GAUZE/BANDAGES/DRESSINGS) ×2 IMPLANT
BANDAGE ELASTIC 6 VELCRO ST LF (GAUZE/BANDAGES/DRESSINGS) ×2 IMPLANT
BANDAGE ESMARK 6X9 LF (GAUZE/BANDAGES/DRESSINGS) ×1 IMPLANT
BLADE SAG 18X100X1.27 (BLADE) ×4 IMPLANT
BLADE SAW SGTL 13.0X1.19X90.0M (BLADE) ×2 IMPLANT
BNDG COHESIVE 6X5 TAN STRL LF (GAUZE/BANDAGES/DRESSINGS) ×4 IMPLANT
BNDG ELASTIC 6X10 VLCR STRL LF (GAUZE/BANDAGES/DRESSINGS) ×6 IMPLANT
BNDG ESMARK 6X9 LF (GAUZE/BANDAGES/DRESSINGS) ×2
BOWL SMART MIX CTS (DISPOSABLE) ×2 IMPLANT
CEMENT BONE SIMPLEX SPEEDSET (Cement) ×4 IMPLANT
CLOTH BEACON ORANGE TIMEOUT ST (SAFETY) ×2 IMPLANT
COVER SURGICAL LIGHT HANDLE (MISCELLANEOUS) ×2 IMPLANT
CUFF TOURNIQUET SINGLE 34IN LL (TOURNIQUET CUFF) ×2 IMPLANT
CUFF TOURNIQUET SINGLE 44IN (TOURNIQUET CUFF) IMPLANT
DRAPE INCISE IOBAN 66X45 STRL (DRAPES) ×2 IMPLANT
DRAPE ORTHO SPLIT 77X108 STRL (DRAPES) ×3
DRAPE SURG ORHT 6 SPLT 77X108 (DRAPES) ×3 IMPLANT
DRAPE U-SHAPE 47X51 STRL (DRAPES) ×2 IMPLANT
DRAPE X-RAY CASS 24X20 (DRAPES) IMPLANT
DRSG PAD ABDOMINAL 8X10 ST (GAUZE/BANDAGES/DRESSINGS) ×4 IMPLANT
DURAPREP 26ML APPLICATOR (WOUND CARE) ×2 IMPLANT
ELECT REM PT RETURN 9FT ADLT (ELECTROSURGICAL) ×2
ELECTRODE REM PT RTRN 9FT ADLT (ELECTROSURGICAL) ×1 IMPLANT
EVACUATOR 1/8 PVC DRAIN (DRAIN) ×2 IMPLANT
FACESHIELD LNG OPTICON STERILE (SAFETY) ×2 IMPLANT
GAUZE XEROFORM 5X9 LF (GAUZE/BANDAGES/DRESSINGS) ×2 IMPLANT
GLOVE BIO SURGEON ST LM GN SZ9 (GLOVE) ×2 IMPLANT
GLOVE BIOGEL PI IND STRL 7.5 (GLOVE) ×1 IMPLANT
GLOVE BIOGEL PI IND STRL 8 (GLOVE) ×1 IMPLANT
GLOVE BIOGEL PI INDICATOR 7.5 (GLOVE) ×1
GLOVE BIOGEL PI INDICATOR 8 (GLOVE) ×1
GLOVE ECLIPSE 7.0 STRL STRAW (GLOVE) ×4 IMPLANT
GLOVE SURG ORTHO 8.0 STRL STRW (GLOVE) ×2 IMPLANT
GOWN PREVENTION PLUS LG XLONG (DISPOSABLE) ×4 IMPLANT
GOWN PREVENTION PLUS XLARGE (GOWN DISPOSABLE) ×2 IMPLANT
GOWN STRL NON-REIN LRG LVL3 (GOWN DISPOSABLE) ×2 IMPLANT
HANDPIECE INTERPULSE COAX TIP (DISPOSABLE) ×1
HOOD PEEL AWAY FACE SHEILD DIS (HOOD) ×8 IMPLANT
IMMOBILIZER KNEE 20 (SOFTGOODS)
IMMOBILIZER KNEE 20 THIGH 36 (SOFTGOODS) IMPLANT
IMMOBILIZER KNEE 22 UNIV (SOFTGOODS) ×4 IMPLANT
IMMOBILIZER KNEE 24 THIGH 36 (MISCELLANEOUS) IMPLANT
IMMOBILIZER KNEE 24 UNIV (MISCELLANEOUS)
KIT BASIN OR (CUSTOM PROCEDURE TRAY) ×2 IMPLANT
KIT ROOM TURNOVER OR (KITS) ×2 IMPLANT
MANIFOLD NEPTUNE II (INSTRUMENTS) ×2 IMPLANT
MARKER SPHERE PSV REFLC THRD 5 (MARKER) IMPLANT
NEEDLE 18GX1X1/2 (RX/OR ONLY) (NEEDLE) ×2 IMPLANT
NEEDLE SPNL 18GX3.5 QUINCKE PK (NEEDLE) ×2 IMPLANT
NS IRRIG 1000ML POUR BTL (IV SOLUTION) ×2 IMPLANT
PACK TOTAL JOINT (CUSTOM PROCEDURE TRAY) ×2 IMPLANT
PAD ARMBOARD 7.5X6 YLW CONV (MISCELLANEOUS) ×4 IMPLANT
PAD CAST 4YDX4 CTTN HI CHSV (CAST SUPPLIES) ×1 IMPLANT
PADDING CAST COTTON 4X4 STRL (CAST SUPPLIES) ×1
PADDING CAST COTTON 6X4 STRL (CAST SUPPLIES) ×2 IMPLANT
PIN SCHANZ 4MM 130MM (PIN) ×8 IMPLANT
RUBBERBAND STERILE (MISCELLANEOUS) ×2 IMPLANT
SET HNDPC FAN SPRY TIP SCT (DISPOSABLE) ×1 IMPLANT
SPONGE GAUZE 4X4 12PLY (GAUZE/BANDAGES/DRESSINGS) ×2 IMPLANT
SPONGE LAP 18X18 X RAY DECT (DISPOSABLE) ×4 IMPLANT
STAPLER VISISTAT 35W (STAPLE) ×2 IMPLANT
SUCTION FRAZIER TIP 10 FR DISP (SUCTIONS) ×2 IMPLANT
SUT ETHILON 3 0 PS 1 (SUTURE) ×4 IMPLANT
SUT VIC AB 0 CTB1 27 (SUTURE) ×6 IMPLANT
SUT VIC AB 1 CT1 27 (SUTURE) ×5
SUT VIC AB 1 CT1 27XBRD ANBCTR (SUTURE) ×5 IMPLANT
SUT VIC AB 2-0 CT1 27 (SUTURE) ×3
SUT VIC AB 2-0 CT1 TAPERPNT 27 (SUTURE) ×3 IMPLANT
SYR 30ML SLIP (SYRINGE) ×2 IMPLANT
SYR TB 1ML LUER SLIP (SYRINGE) ×4 IMPLANT
TOWEL OR 17X24 6PK STRL BLUE (TOWEL DISPOSABLE) ×2 IMPLANT
TOWEL OR 17X26 10 PK STRL BLUE (TOWEL DISPOSABLE) ×4 IMPLANT
TRAY FOLEY CATH 14FR (SET/KITS/TRAYS/PACK) ×2 IMPLANT
WATER STERILE IRR 1000ML POUR (IV SOLUTION) ×4 IMPLANT

## 2013-03-24 NOTE — Anesthesia Procedure Notes (Signed)
Procedure Name: Intubation Date/Time: 03/24/2013 12:07 PM Performed by: Arlice Colt B Pre-anesthesia Checklist: Patient identified, Emergency Drugs available, Suction available, Patient being monitored and Timeout performed Patient Re-evaluated:Patient Re-evaluated prior to inductionOxygen Delivery Method: Circle system utilized Preoxygenation: Pre-oxygenation with 100% oxygen Intubation Type: IV induction Ventilation: Mask ventilation without difficulty Laryngoscope Size: Mac and 4 Grade View: Grade I Tube type: Oral Tube size: 7.5 mm Number of attempts: 1 Airway Equipment and Method: Stylet Placement Confirmation: ETT inserted through vocal cords under direct vision,  positive ETCO2 and breath sounds checked- equal and bilateral Secured at: 23 cm Tube secured with: Tape Dental Injury: Teeth and Oropharynx as per pre-operative assessment

## 2013-03-24 NOTE — Progress Notes (Signed)
Duplicate orders DC'd.

## 2013-03-24 NOTE — Brief Op Note (Signed)
03/24/2013  2:47 PM  PATIENT:  Brian Parker  63 y.o. male  PRE-OPERATIVE DIAGNOSIS:  Left Knee Osteoarthrosis  POST-OPERATIVE DIAGNOSIS:  Left Knee Osteoarthrosis  PROCEDURE:  Procedure(s): LEFT TOTAL KNEE ARTHROPLASTY  SURGEON:  Surgeon(s): Cammy Copa, MD  ASSISTANT: Jodene Nam  ANESTHESIA:   general  EBL: 100 ml    Total I/O In: 1800 [I.V.:1800] Out: 500 [Urine:400; Blood:100]  BLOOD ADMINISTERED: none  DRAINS: none   LOCAL MEDICATIONS USED:  none  SPECIMEN:  No Specimen  COUNTS:  YES  TOURNIQUET:   Total Tourniquet Time Documented: Thigh (Right) - 81 minutes Total: Thigh (Right) - 81 minutes   DICTATION: .Other Dictation: Dictation Number [id number]  PLAN OF CARE: Admit to inpatient   PATIENT DISPOSITION:  PACU - hemodynamically stable

## 2013-03-24 NOTE — Progress Notes (Signed)
Pt states pain is 8/10 but this is not borne out in his actions or affect.  Pain score re-explained to patient, and he now states pain = 4/10.  Pain button given to patient, with explanation of how/when to use it.

## 2013-03-24 NOTE — Consult Note (Signed)
ANTICOAGULATION CONSULT NOTE - Initial Consult  Pharmacy Consult for Coumadin Indication: VTE prophylaxis   Allergies: Allergies  Allergen Reactions  . Losartan     Stomach cramps.    Height/Weight: Height: 5\' 10"  (177.8 cm) Weight: 235 lb 10.8 oz (106.9 kg) IBW/kg (Calculated) : 73 Dosing weight 107 kg  Vital Signs: BP 126/74  Pulse 50  Temp(Src) 97.8 F (36.6 C) (Oral)  Resp 12  Ht 5\' 10"  (1.778 m)  Wt 235 lb 10.8 oz (106.9 kg)  BMI 33.82 kg/m2  SpO2 100%  Active Problems: Active Problems:   * No active hospital problems. *   Labs: No results found for this basename: HGB, HCT, PLT, APTT, LABPROT, INR, HEPARINUNFRC, CREATININE,  in the last 72 hours Lab Results  Component Value Date   INR 0.91 03/16/2013   INR 0.96 12/11/2011   INR 0.91 01/03/2010   Estimated Creatinine Clearance: 91.6 ml/min (by C-G formula based on Cr of 1.05).  Medical / Surgical History: Past Medical History  Diagnosis Date  . Non Hodgkin's lymphoma   . Hypertension   . Edema of lower extremity   . Follicular lymphoma grade I of intra-abdominal lymph nodes 12/31/2006  . Elevated prostate specific antigen (PSA) 11/20/2011  . Nodular lymphoma involving intra-abdominal lymph nodes 12/20/2011  . Chest pain     INTERMITTENT/15 years ago//stress induced  . Kidney carcinoma 03/01/2003    tumor seperate from lymphoma; found during routine screening for lymphoma  . Cancer of kidney, secondary 01/06/2010    right kidney//right kidney removed//left kidney functions WDL   Past Surgical History  Procedure Laterality Date  . Nephrectomy      right kidney  . Appendectomy    . Shoulder surgery    . US echocardiography  02/26/2006    EF 55-60%  . Cardiovascular stress test  10/19/2008    EF 52%, NO ISCHEMIA  . Groin mass open biopsy    . Ventral hernia repair  2003-2004    with mesh after biopsy  . Hernia repair  2006-2007    right laparoscopic  . Hernia repair  2006-2007    left laparoscopic     Medications:  Prescriptions prior to admission  Medication Sig Dispense Refill  . amLODipine (NORVASC) 5 MG tablet Take 5 mg by mouth daily.      [name] aspirin EC 81 MG tablet Take 81 mg by mouth daily.        [name] doxazosin (CARDURA) 4 MG tablet Take 1 tablet (4 mg total) by mouth at bedtime.  30 tablet  6  . Fish Oil-Krill Oil CAPS Take 1 capsule by mouth daily.      . hydrochlorothiazide (HYDRODIURIL) 25 MG tablet Take 1 tablet (25 mg total) by mouth daily.  30 tablet  5  . Multiple Vitamin (MULTIVITAMIN) tablet Take 1 tablet by mouth daily.        . potassium chloride (K-DUR) 10 MEQ tablet Take 1 tablet (10 mEq total) by mouth daily.  30 tablet  5  . ibuprofen (ADVIL,MOTRIN) 200 MG tablet Take 400 mg by mouth every 6 (six) hours as needed. For pain       . LEVITRA 20 MG tablet Take 20 mg by mouth daily as needed for erectile dysfunction.         Assessment:  62 y.o.male with history of NHL now s/p L-TKA who will start Coumadin per protocol for VTE prophylaxis post-op.  Coumadin predictor Score of 6.  Patient on ASA + SCD's.  Goal of Therapy:   Target INR of 2-3   Plan:   Coumadin 7.5 mg po today. Monitor for bleeding complications  Daily INR's, CBC.   Tyller Bowlby, Elisha Headland,  Pharm.D.. 03/24/2013,  4:33 PM

## 2013-03-24 NOTE — H&P (Signed)
TOTAL KNEE ADMISSION H&P  Patient is being admitted for left total knee arthroplasty.  Subjective:  Chief Complaint:left knee pain.  HPI: Brian Parker, 63 y.o. male, has a history of pain and functional disability in the left knee due to arthritis and has failed non-surgical conservative treatments for greater than 12 weeks to includeNSAID's and/or analgesics, corticosteriod injections, flexibility and strengthening excercises, use of assistive devices and activity modification.  Onset of symptoms was gradual, starting 7 years ago with gradually worsening course since that time. The patient noted prior procedures on the knee to include  menisectomy on the left knee(s).  Patient currently rates pain in the left knee(s) at 7 out of 10 with activity. Patient has night pain, worsening of pain with activity and weight bearing, pain that interferes with activities of daily living, crepitus and joint swelling.  Patient has evidence of subchondral sclerosis and joint space narrowing by imaging studies. This patient has had significant increase in pain recently affceting all areas of his life. There is no active infection.  Patient Active Problem List   Diagnosis Date Noted  . Nodular lymphoma involving intra-abdominal lymph nodes 12/20/2011  . Elevated prostate specific antigen (PSA) 11/20/2011  . HTN (hypertension) 11/09/2011  . Cancer of kidney, secondary 01/06/2010  . Kidney carcinoma 03/01/2003   Past Medical History  Diagnosis Date  . Non Hodgkin's lymphoma   . Hypertension   . Edema of lower extremity   . Follicular lymphoma grade I of intra-abdominal lymph nodes 12/31/2006  . Elevated prostate specific antigen (PSA) 11/20/2011  . Nodular lymphoma involving intra-abdominal lymph nodes 12/20/2011  . Chest pain     INTERMITTENT/15 years ago//stress induced  . Kidney carcinoma 03/01/2003    tumor seperate from lymphoma; found during routine screening for lymphoma  . Cancer of kidney, secondary  01/06/2010    right kidney//right kidney removed//left kidney functions WDL    Past Surgical History  Procedure Laterality Date  . Nephrectomy      right kidney  . Appendectomy    . Shoulder surgery    . US echocardiography  02/26/2006    EF 55-60%  . Cardiovascular stress test  10/19/2008    EF 52%, NO ISCHEMIA  . Groin mass open biopsy    . Ventral hernia repair  2003-2004    with mesh after biopsy  . Hernia repair  2006-2007    right laparoscopic  . Hernia repair  2006-2007    left laparoscopic    Prescriptions prior to admission  Medication Sig Dispense Refill  . amLODipine (NORVASC) 5 MG tablet Take 5 mg by mouth daily.      [name] aspirin EC 81 MG tablet Take 81 mg by mouth daily.        [name] doxazosin (CARDURA) 4 MG tablet Take 1 tablet (4 mg total) by mouth at bedtime.  30 tablet  6  . Fish Oil-Krill Oil CAPS Take 1 capsule by mouth daily.      . hydrochlorothiazide (HYDRODIURIL) 25 MG tablet Take 1 tablet (25 mg total) by mouth daily.  30 tablet  5  . Multiple Vitamin (MULTIVITAMIN) tablet Take 1 tablet by mouth daily.        . potassium chloride (K-DUR) 10 MEQ tablet Take 1 tablet (10 mEq total) by mouth daily.  30 tablet  5  . ibuprofen (ADVIL,MOTRIN) 200 MG tablet Take 400 mg by mouth every 6 (six) hours as needed. For pain       . LEVITRA 20  MG tablet Take 20 mg by mouth daily as needed for erectile dysfunction.        Allergies  Allergen Reactions  . Losartan     Stomach cramps.    History  Substance Use Topics  . Smoking status: Never Smoker   . Smokeless tobacco: Never Used  . Alcohol Use: 4.2 oz/week    6 Glasses of wine, 1 Cans of beer per week    Family History  Problem Relation Age of Onset  . Uterine cancer Mother   . Cancer Mother     cervical  . Coronary artery disease Father   . Hypertension Father      Review of Systems  Constitutional: Negative.   HENT: Negative.   Eyes: Negative.   Respiratory: Negative.   Cardiovascular: Negative.    Gastrointestinal: Negative.   Genitourinary: Negative.   Musculoskeletal: Positive for joint pain.  Skin: Negative.   Neurological: Negative.   Endo/Heme/Allergies: Negative.   Psychiatric/Behavioral: Negative.     Objective:  Physical Exam  Constitutional: He appears well-developed.  HENT:  Head: Normocephalic.  Eyes: Pupils are equal, round, and reactive to light.  Neck: Normal range of motion.  Cardiovascular: Normal rate.   Respiratory: Effort normal.  GI: Soft.  Neurological: He is alert.  Skin: Skin is warm.   left knee examination demonstrates effusion medial and lateral joint line tenderness stable collaterals and cruciates mild patellofemoral crepitus palpable pedal pulses well-healed proximal lateral incision from previous sinus tract excision from puncture wound approximately 4 months ago is no proximal lymphadenopathy range of motion near full  Vital signs in last 24 hours: Temp:  [98.3 F (36.8 C)] 98.3 F (36.8 C) (03/25 0906) Pulse Rate:  [54-67] 67 (03/25 1050) Resp:  [10-20] 10 (03/25 1050) BP: (147)/(94) 147/94 mmHg (03/25 0906) SpO2:  [98 %-100 %] 100 % (03/25 1050)  Labs:   Estimated body mass index is 33.29 kg/(m^2) as calculated from the following:   Height as of 03/16/13: 5\' 10"  (1.778 m).   Weight as of 01/29/13: 105.235 kg (232 lb).   Imaging Review Plain radiographs demonstrate moderate degenerative joint disease of the left knee(s). The overall alignment ismild varus. The bone quality appears to be good for age and reported activity level.  Assessment/Plan:  End stage arthritis, left knee   The patient history, physical examination, clinical judgment of the provider and imaging studies are consistent with end stage degenerative joint disease of the left knee(s) and total knee arthroplasty is deemed medically necessary. The treatment options including medical management, injection therapy arthroscopy and arthroplasty were discussed at length.  The risks and benefits of total knee arthroplasty were presented and reviewed. The risks due to aseptic loosening, infection, stiffness, patella tracking problems, thromboembolic complications and other imponderables were discussed. The patient acknowledged the explanation, agreed to proceed with the plan and consent was signed. Patient is being admitted for inpatient treatment for surgery, pain control, PT, OT, prophylactic antibiotics, VTE prophylaxis, progressive ambulation and ADL's and discharge planning. The patient is planning to be discharged home with home health services

## 2013-03-24 NOTE — Transfer of Care (Signed)
Immediate Anesthesia Transfer of Care Note  Patient: Brian Parker  Procedure(s) Performed: Procedure(s) with comments: LEFT TOTAL KNEE ARTHROPLASTY (Left) - Left Total Knee Arthroplasty  Patient Location: PACU  Anesthesia Type:General  Level of Consciousness: awake, alert  and oriented  Airway & Oxygen Therapy: Patient Spontanous Breathing and Patient connected to nasal cannula oxygen  Post-op Assessment: Report given to PACU RN and Post -op Vital signs reviewed and stable  Post vital signs: Reviewed and stable  Complications: No apparent anesthesia complications

## 2013-03-24 NOTE — Progress Notes (Signed)
Orthopedic Tech Progress Note Patient Details:  Brian Parker 21-Dec-1950 [id number]  CPM Left Knee CPM Left Knee: On Left Knee Flexion (Degrees): 40 Left Knee Extension (Degrees): 0 ohf will be provided when one becomes available  [name] 03/24/2013, 4:02 PM

## 2013-03-24 NOTE — Anesthesia Preprocedure Evaluation (Signed)
Anesthesia Evaluation  Patient identified by MRN, date of birth, ID band Patient awake    Reviewed: Allergy & Precautions, H&P , NPO status , Patient's Chart, lab work & pertinent test results  Airway Mallampati: I TM Distance: >3 FB Neck ROM: full    Dental   Pulmonary          Cardiovascular hypertension, Rhythm:regular Rate:Normal     Neuro/Psych    GI/Hepatic   Endo/Other    Renal/GU Renal disease     Musculoskeletal   Abdominal   Peds  Hematology  (+) Blood dyscrasia, ,   Anesthesia Other Findings   Reproductive/Obstetrics                           Anesthesia Physical Anesthesia Plan  ASA: II  Anesthesia Plan: General   Post-op Pain Management:    Induction: Intravenous  Airway Management Planned: Oral ETT and LMA  Additional Equipment:   Intra-op Plan:   Post-operative Plan: Extubation in OR  Informed Consent: I have reviewed the patients History and Physical, chart, labs and discussed the procedure including the risks, benefits and alternatives for the proposed anesthesia with the patient or authorized representative who has indicated his/her understanding and acceptance.     Plan Discussed with: CRNA, Anesthesiologist and Surgeon  Anesthesia Plan Comments:         Anesthesia Quick Evaluation

## 2013-03-25 HISTORY — PX: TOTAL KNEE ARTHROPLASTY: SHX125

## 2013-03-25 LAB — BASIC METABOLIC PANEL
BUN: 23 mg/dL (ref 6–23)
Chloride: 105 mEq/L (ref 96–112)
Glucose, Bld: 132 mg/dL — ABNORMAL HIGH (ref 70–99)
Potassium: 4 mEq/L (ref 3.5–5.1)

## 2013-03-25 LAB — CBC
HCT: 32.8 % — ABNORMAL LOW (ref 39.0–52.0)
Hemoglobin: 11.5 g/dL — ABNORMAL LOW (ref 13.0–17.0)
WBC: 9.5 10*3/uL (ref 4.0–10.5)

## 2013-03-25 LAB — PROTIME-INR: INR: 1.13 (ref 0.00–1.49)

## 2013-03-25 MED ORDER — HYDROMORPHONE HCL PF 1 MG/ML IJ SOLN
0.5000 mg | INTRAMUSCULAR | Status: DC | PRN
  Administered 2013-03-25 – 2013-03-26 (×7): 0.5 mg via INTRAVENOUS
  Filled 2013-03-25 (×7): qty 1

## 2013-03-25 MED ORDER — OXYCODONE HCL 5 MG PO TABS
5.0000 mg | ORAL_TABLET | ORAL | Status: DC | PRN
  Administered 2013-03-25 (×3): 10 mg via ORAL
  Administered 2013-03-25: 5 mg via ORAL
  Administered 2013-03-25 – 2013-03-26 (×2): 10 mg via ORAL
  Filled 2013-03-25 (×3): qty 2
  Filled 2013-03-25: qty 1
  Filled 2013-03-25 (×2): qty 2

## 2013-03-25 MED ORDER — DOXAZOSIN MESYLATE 2 MG PO TABS
2.0000 mg | ORAL_TABLET | Freq: Every day | ORAL | Status: DC
  Administered 2013-03-25 – 2013-03-26 (×2): 2 mg via ORAL
  Filled 2013-03-25 (×5): qty 1

## 2013-03-25 MED ORDER — ALUM & MAG HYDROXIDE-SIMETH 200-200-20 MG/5ML PO SUSP
30.0000 mL | ORAL | Status: DC | PRN
  Administered 2013-03-26: 30 mL via ORAL
  Filled 2013-03-25: qty 30

## 2013-03-25 MED ORDER — WARFARIN SODIUM 7.5 MG PO TABS
7.5000 mg | ORAL_TABLET | Freq: Once | ORAL | Status: AC
  Administered 2013-03-25: 7.5 mg via ORAL
  Filled 2013-03-25: qty 1

## 2013-03-25 NOTE — Progress Notes (Signed)
Physical Therapy Treatment Patient Details Name: Brian Parker MRN: [id number] DOB: [birth year] Today's Date: 03/25/2013 Time: 3086-5784 PT Time Calculation (min): 62 min  PT Assessment / Plan / Recommendation Comments on Treatment Session  Pt tolerated this session much better than morning session. Should be able to meet mobility goals tomorrow.  ROM limted secondary to Pain. Pt left in CPM machine set at 33 degrees (pain threshold).      Follow Up Recommendations  Home health PT     Does the patient have the potential to tolerate intense rehabilitation     Barriers to Discharge        Equipment Recommendations  None recommended by PT    Recommendations for Other Services    Frequency 7X/week   Plan Discharge plan remains appropriate;Frequency remains appropriate    Precautions / Restrictions Precautions Precautions: Knee Precaution Booklet Issued: No Required Braces or Orthoses: Knee Immobilizer - Left Knee Immobilizer - Left: On when out of bed or walking Restrictions Weight Bearing Restrictions: Yes LLE Weight Bearing: Weight bearing as tolerated   Pertinent Vitals/Pain 8/10 pain in L knee.  RN notified.      Mobility  Bed Mobility Bed Mobility: Sit to Supine Sit to Supine: 4: Min guard Details for Bed Mobility Assistance: used leg lfiter for L LE into bed Transfers Transfers: Sit to Stand;Stand to [employer] Transfers Sit to Stand: 4: Min assist;4: Min guard;With upper extremity assist;From chair/3-in-1 Stand to Sit: 4: Min guard;To bed;With upper extremity assist;4: Min assist;To chair/3-in-1;With armrests Stand Pivot Transfers: 4: Min guard Details for Transfer Assistance: Vcs for safe technique including hand placement. Assist to steady pt and control descent to chair.   Ambulation/Gait Ambulation/Gait Assistance: 4: Min guard Ambulation Distance (Feet): 75 Feet Assistive device: Rolling walker Ambulation/Gait Assistance Details: Vcs for gait sequencing  and increased step length with RLE>   Gait Pattern: Step-to pattern;Decreased stance time - left Stairs: No Wheelchair Mobility Wheelchair Mobility: No    Exercises Total Joint Exercises Ankle Circles/Pumps: Both;10 reps Quad Sets: Both;10 reps   PT Diagnosis:    PT Problem List:   PT Treatment Interventions:     PT Goals Acute Rehab PT Goals PT Goal Formulation: With patient Time For Goal Achievement: 04/01/13 Potential to Achieve Goals: Good Pt will go Supine/Side to Sit: with modified independence PT Goal: Supine/Side to Sit - Progress: Progressing toward goal Pt will go Sit to Supine/Side: with modified independence PT Goal: Sit to Supine/Side - Progress: Progressing toward goal Pt will Transfer Bed to Chair/Chair to Bed: with modified independence PT Transfer Goal: Bed to Chair/Chair to Bed - Progress: Progressing toward goal Pt will Ambulate: >150 feet;with modified independence;with rolling walker PT Goal: Ambulate - Progress: Progressing toward goal Pt will Go Up / Down Stairs: Flight;with supervision;with rail(s);with least restrictive assistive device PT Goal: Up/Down Stairs - Progress: Progressing toward goal Pt will Perform Home Exercise Program: Independently  Visit Information  Last PT Received On: 03/25/13 Assistance Needed: +1 PT/OT Co-Evaluation/Treatment: Yes    Subjective Data  Subjective: My knee hurts worse than this morning.  Patient Stated Goal: walk without pain.    Cognition  Cognition Overall Cognitive Status: Appears within functional limits for tasks assessed/performed Arousal/Alertness: Awake/alert Orientation Level: Oriented X4 / Intact Behavior During Session: [facility] for tasks performed    Balance  Balance Balance Assessed: No  End of Session PT - End of Session Equipment Utilized During Treatment: Gait belt;Left knee immobilizer Activity Tolerance: Patient tolerated treatment well Patient  left: in bed;in CPM;with call bell/phone within  reach;with family/visitor present Nurse Communication: Mobility status   Brian Parker     Brian Parker 03/25/2013, 5:00 PM Brian Parker L. Brian Parker DPT [phone]

## 2013-03-25 NOTE — Progress Notes (Signed)
Patient with intermittent drop in respirations according to PCA monitor causing machine to beep.  Patient unable to get a decent night's rest, patient agreeable to d/c PCA pump and begin taking oral medication.  Spoke with Dr. Ophelia Charter on call for Dr. August Saucer, order to d/c PCA and begin other pain medication already prescribed for patient.

## 2013-03-25 NOTE — Progress Notes (Signed)
ANTICOAGULATION CONSULT NOTE - Follow Up Consult  Pharmacy Consult for Coumadin Indication: VTE prophylaxis  Allergies  Allergen Reactions  . Losartan     Stomach cramps.    Patient Measurements: Height: 5\' 10"  (177.8 cm) Weight: 235 lb 10.8 oz (106.9 kg) IBW/kg (Calculated) : 73 Heparin Dosing Weight:   Vital Signs: Temp: 98.3 F (36.8 C) (03/26 0539) Temp src: Oral (03/26 0539) BP: 100/64 mmHg (03/26 0539) Pulse Rate: 74 (03/26 0539)  Labs:  Recent Labs  03/25/13 0434  HGB 11.5*  HCT 32.8*  PLT 152  LABPROT 14.3  INR 1.13  CREATININE 1.30    Estimated Creatinine Clearance: 74 ml/min (by C-G formula based on Cr of 1.3).  Assessment: 62yom on Coumadin for VTE prophylaxis s/p TKA. INR (1.13) is subtherapeutic as expected - will repeat Coumadin 7.5mg  tonight. - H/H trended down, Plts wnl - No significant bleeding reported - Warfarin points: 6  Goal of Therapy:  INR 2-3   Plan:  1. Repeat Coumadin 7.5mg  po x 1 today 2. Follow-up AM INR  Cleon Dew [phone] 03/25/2013,11:32 AM

## 2013-03-25 NOTE — Evaluation (Signed)
Occupational Therapy Evaluation Patient Details Name: LORNE WINKELS MRN: [id number] DOB: 11-08-1950 Today's Date: 03/25/2013 Time: 7829-5621 OT Time Calculation (min): 39 min  OT Assessment / Plan / Recommendation Clinical Impression  Pt demos decline in function with ADLs, strength, activity tolerance, safety and balance following L TKR. Pt would benefit from OT services to address these impairments to help restore PLOF to return home safely    OT Assessment  Patient needs continued OT Services    Follow Up Recommendations  Home health OT    Barriers to Discharge None    Equipment Recommendations  None recommended by OT    Recommendations for Other Services    Frequency  Min 2X/week    Precautions / Restrictions Precautions Precautions: Knee Required Braces or Orthoses: Knee Immobilizer - Left Knee Immobilizer - Left: On when out of bed or walking Restrictions Weight Bearing Restrictions: Yes LLE Weight Bearing: Weight bearing as tolerated   Pertinent Vitals/Pain     ADL  Grooming: Performed;Wash/dry hands;Wash/dry face;Min guard Where Assessed - Grooming: Supported standing Upper Body Bathing: Simulated;Supervision/safety;Set up Lower Body Bathing: Moderate assistance Upper Body Dressing: Performed;Supervision/safety;Set up Lower Body Dressing: Moderate assistance Toilet Transfer: Minimal assistance;Performed Toilet Transfer Method: Sit to stand Toilet Transfer Equipment: Raised toilet seat with arms (or 3-in-1 over toilet) Toileting - Clothing Manipulation and Hygiene: Performed;Minimal assistance Where Assessed - Glass blower/designer Manipulation and Hygiene: Standing Equipment Used: Long-handled shoe horn;Long-handled sponge;Reacher;Rolling walker;Sock aid;Gait belt;Knee Immobilizer ADL Comments: pt and wife provided with education and demo of ADL A?E for use at home    OT Diagnosis: Generalized weakness;Acute pain  OT Problem List: Decreased strength;Decreased  knowledge of use of DME or AE;Decreased activity tolerance;Impaired balance (sitting and/or standing);Pain OT Treatment Interventions: Self-care/ADL training;Balance training;Therapeutic exercise;Neuromuscular education;Therapeutic activities;DME and/or AE instruction;Patient/family education   OT Goals Acute Rehab OT Goals OT Goal Formulation: With patient/family Time For Goal Achievement: 04/01/13 Potential to Achieve Goals: Good ADL Goals Pt Will Perform Grooming: with set-up;with supervision;Standing at sink ADL Goal: Grooming - Progress: Goal set today Pt Will Perform Lower Body Bathing: with min assist ADL Goal: Lower Body Bathing - Progress: Goal set today Pt Will Perform Lower Body Dressing: with min assist ADL Goal: Lower Body Dressing - Progress: Goal set today Pt Will Transfer to Toilet: with supervision;with DME;Grab bars ADL Goal: Toilet Transfer - Progress: Goal set today Pt Will Perform Toileting - Clothing Manipulation: with supervision;Standing ADL Goal: Toileting - Clothing Manipulation - Progress: Goal set today Pt Will Perform Tub/Shower Transfer: with DME;with supervision ADL Goal: Tub/Shower Transfer - Progress: Goal set today  Visit Information  Last OT Received On: 03/25/13    Subjective Data  Subjective: " I felt sick this morning " Patient Stated Goal: To return home with [facility] therapy   Prior Functioning     Home Living Lives With: Family;Spouse;Daughter Available Help at Discharge: Family Type of Home: House Home Access: Stairs to enter Secretary/administrator of Steps: 1 Entrance Stairs-Rails: None Home Layout: Two level;Bed/bath upstairs Alternate Level Stairs-Number of Steps: 14 Alternate Level Stairs-Rails: Left Bathroom Shower/Tub: Walk-in shower;Door How Accessible: Accessible via walker Home Adaptive Equipment: Walker - rolling;Raised toilet seat with rails;Shower chair without back Prior Function Level of Independence: Independent Able  to Take Stairs?: Yes Driving: Yes Vocation: Full time employment Communication Communication: No difficulties Dominant Hand: Right         Vision/Perception Vision - History Baseline Vision: Wears glasses only for reading Patient Visual Report: No change from baseline Perception Perception:  Within Functional Limits   Cognition  Cognition Overall Cognitive Status: Appears within functional limits for tasks assessed/performed Arousal/Alertness: Awake/alert Orientation Level: Oriented X4 / Intact Behavior During Session: [facility] for tasks performed    Extremity/Trunk Assessment Right Upper Extremity Assessment RUE ROM/Strength/Tone: [facility] for tasks assessed Left Upper Extremity Assessment LUE ROM/Strength/Tone: WFL for tasks assessed     Mobility Bed Mobility Bed Mobility: Sit to Supine Sit to Supine: 4: Min guard Details for Bed Mobility Assistance: used leg lfiter for L LE into bed Transfers Transfers: Sit to Stand;Stand to Sit Sit to Stand: 4: Min assist;4: Min guard;With upper extremity assist;From chair/3-in-1 Stand to Sit: 4: Min guard;To bed;With upper extremity assist;4: Min assist;To chair/3-in-1;With armrests     Exercise     Balance Balance Balance Assessed: No   End of Session OT - End of Session Equipment Utilized During Treatment: Gait belt;Left knee immobilizer (RW, 3 in 1 over toilet) Activity Tolerance: Patient tolerated treatment well Patient left: in bed;with call bell/phone within reach;with family/visitor present  GO     Galen Manila 03/25/2013, 4:16 PM

## 2013-03-25 NOTE — Anesthesia Postprocedure Evaluation (Signed)
  Anesthesia Post-op Note  Patient: Brian Parker  Procedure(s) Performed: Procedure(s) with comments: LEFT TOTAL KNEE ARTHROPLASTY (Left) - Left Total Knee Arthroplasty  Patient Location: PACU  Anesthesia Type:General  Level of Consciousness: awake, alert , oriented and patient cooperative  Airway and Oxygen Therapy: Patient Spontanous Breathing  Post-op Pain: moderate  Post-op Assessment: Post-op Vital signs reviewed, Patient's Cardiovascular Status Stable, Respiratory Function Stable, Patent Airway, No signs of Nausea or vomiting and Pain level controlled  Post-op Vital Signs: stable  Complications: No apparent anesthesia complications

## 2013-03-25 NOTE — Evaluation (Signed)
Physical Therapy Evaluation Patient Details Name: Brian Parker MRN: [id number] DOB: 07/07/1950 Today's Date: 03/25/2013 Time: 0810-0903 PT Time Calculation (min): 53 min  PT Assessment / Plan / Recommendation Clinical Impression  Pt is a 63 y/o male s/p L TKA. Acute PT to follow pt to progress mobility and activity tolerance for safe return to home with HHPT.     PT Assessment  Patient needs continued PT services    Follow Up Recommendations  Home health PT    Does the patient have the potential to tolerate intense rehabilitation      Barriers to Discharge None      Equipment Recommendations  None recommended by PT    Recommendations for Other Services     Frequency 7X/week    Precautions / Restrictions Precautions Precautions: Knee Required Braces or Orthoses: Knee Immobilizer - Left Knee Immobilizer - Left: On when out of bed or walking Restrictions Weight Bearing Restrictions: Yes LLE Weight Bearing: Weight bearing as tolerated   Pertinent Vitals/Pain 8/10 pain in knee.  Pt medicated prior to session.        Mobility  Bed Mobility Bed Mobility: Supine to Sit;Sitting - Scoot to Edge of Bed Supine to Sit: 4: Min assist;HOB flat Sitting - Scoot to [place] of Bed: 4: Min assist Details for Bed Mobility Assistance: Assist for LLE.   Transfers Transfers: Sit to Stand;Stand to [employer] Transfers Sit to Stand: 4: Min assist;4: Min guard;From bed;With upper extremity assist Stand to Sit: 4: Min guard;To bed;With upper extremity assist;4: Min assist;To chair/3-in-1;With armrests Stand Pivot Transfers: 4: Min guard Details for Transfer Assistance: Vcs for safe technique including hand placement. Assist to steady pt and control descent to chair.   Ambulation/Gait Ambulation/Gait Assistance: Not tested (comment) Stairs: No Wheelchair Mobility Wheelchair Mobility: No    Exercises Total Joint Exercises Ankle Circles/Pumps: Both;10 reps Quad Sets: Both;10 reps    PT Diagnosis: Difficulty walking;Generalized weakness;Acute pain  PT Problem List: Decreased strength;Decreased range of motion;Decreased activity tolerance;Decreased mobility;Decreased knowledge of precautions;Pain;Decreased knowledge of use of DME PT Treatment Interventions: Gait training;Stair training;DME instruction;Functional mobility training;Therapeutic activities;Therapeutic exercise;Patient/family education   PT Goals Acute Rehab PT Goals PT Goal Formulation: With patient Time For Goal Achievement: 04/01/13 Potential to Achieve Goals: Good Pt will go Supine/Side to Sit: with modified independence PT Goal: Supine/Side to Sit - Progress: Goal set today Pt will go Sit to Supine/Side: with modified independence PT Goal: Sit to Supine/Side - Progress: Goal set today Pt will Transfer Bed to Chair/Chair to Bed: with modified independence PT Transfer Goal: Bed to Chair/Chair to Bed - Progress: Goal set today Pt will Ambulate: >150 feet;with modified independence;with rolling walker PT Goal: Ambulate - Progress: Goal set today Pt will Go Up / Down Stairs: Flight;with supervision;with rail(s);with least restrictive assistive device PT Goal: Up/Down Stairs - Progress: Goal set today Pt will Perform Home Exercise Program: Independently PT Goal: Perform Home Exercise Program - Progress: Goal set today  Visit Information  Last PT Received On: 03/25/13 Assistance Needed: +1    Subjective Data  Subjective: Agree to PT eval.   Patient Stated Goal: walk without pain.    Prior Functioning  Home Living Lives With: Family;Spouse;Daughter Available Help at Discharge: Family Type of Home: House Home Access: Stairs to enter Secretary/administrator of Steps: 1 Entrance Stairs-Rails: None Home Layout: Two level;Bed/bath upstairs Alternate Level Stairs-Number of Steps: 14 Alternate Level Stairs-Rails: Left Bathroom Shower/Tub: Walk-in shower;Door Bathroom Toilet: Standard Bathroom  Accessibility: Yes How Accessible:  Accessible via walker Home Adaptive Equipment: Walker - rolling;Raised toilet seat with rails;Shower chair without back Prior Function Level of Independence: Independent Able to Take Stairs?: Yes Driving: Yes Vocation: Full time employment Communication Communication: No difficulties    Cognition  Cognition Overall Cognitive Status: Appears within functional limits for tasks assessed/performed Arousal/Alertness: Awake/alert Orientation Level: Oriented X4 / Intact Behavior During Session: [facility] for tasks performed    Extremity/Trunk Assessment Right Upper Extremity Assessment RUE ROM/Strength/Tone: Within functional levels Left Upper Extremity Assessment LUE ROM/Strength/Tone: Within functional levels Right Lower Extremity Assessment RLE ROM/Strength/Tone: Within functional levels Left Lower Extremity Assessment LLE ROM/Strength/Tone: Due to pain;Deficits LLE ROM/Strength/Tone Deficits: LImited secondary to surgical pain.   Trunk Assessment Trunk Assessment: Normal   Balance    End of Session PT - End of Session Equipment Utilized During Treatment: Gait belt;Left knee immobilizer Activity Tolerance: Other (comment) (Pt limited by c/o dizziness) Patient left: in chair;with call bell/phone within reach;with family/visitor present Nurse Communication: Mobility status CPM Left Knee CPM Left Knee: Off  GP     Harbor Vanover 03/25/2013, 12:13 PM Dontez Hauss L. Ole Lafon DPT [phone]

## 2013-03-25 NOTE — Op Note (Signed)
NAMEHENSLEY, TREAT NO.:  [id number]  MEDICAL RECORD NO.:  [id number]  LOCATION:  [id number]                        FACILITY:  [facility]  PHYSICIAN:  Burnard Bunting, M.D.    DATE OF BIRTH:  28-Feb-1950  DATE OF PROCEDURE: DATE OF DISCHARGE:                              OPERATIVE REPORT   PREOPERATIVE DIAGNOSIS:  Left knee arthritis.  POSTOPERATIVE DIAGNOSIS:  Left knee arthritis.  PROCEDURE:  Left total knee replacement using Stryker triathlon knee, size 5 femur, 6 tibia, 11 poly inserts, 35 mm 3 pegged poly patella, posterior cruciate sacrificing.  SURGEON:  Burnard Bunting, M.D.  ASSISTANT:  Brian Parker.  ANESTHESIA:  General trach placement.  BLOOD LOSS:  25 mL.  DRAINS:  None.  TOURNIQUET TIME:  1 hour 20 minutes at 3 mmHg.  INDICATION:  [name] is a patient with end-stage left knee arthritis, presents for operative management after explanation of risks and benefits.  PROCEDURE IN DETAIL:  The patient was brought to the operating room, where general endotracheal anesthesia was induced.  Preoperative antibiotics were administered.  Time-out was called.  Left leg was prescrubbed with alcohol and Betadine, which was allowed to air dry, prepped with DuraPrep solution and draped in sterile manner.  Brian Parker was used to cover the operative field.  Anterior approach to the knee was made.  Skin and subcutaneous tissues were sharply divided.  Median parapatellar approach was made and marked with #1 Vicryl suture. Lateral patellofemoral ligament was released.  Soft tissue removed from anterior distal femur.  Periosteal sleeve developed around the medial tibia for exposure.  ACL and PCL released.  Intramedullary alignment used on the tibia.  Perpendicular cut made to the mechanical axis, taking 9 off the least affected lateral side.  In a similar fashion, a 5- degree cut was made off of the distal femur 10 mm.  This allowed 9-mm spacer block, and the knee to  be in full extension.  At this time, box cut and chamfers were made with collaterals and posterior structures well protected.  Tibia was keel punched.  Tibial tray was placed over the medial 3rd of the tibial tubercle.  Trial reduction was performed and placed on 9 and 11 spacer and the patient had about 5-6 degrees of hyperextension with 9 spacer, at 2 degrees of hyperextension with 11 spacer.  Collateral ligament stability with the 11 was excellent at 0 and 39 degrees, and patella tracked well with no thumbs technique. Patella was cut freehand from 24-14 and 35 mm x 10 polyethylene patellar trial button was placed and had excellent tracking at this time.  Trial components were removed.  Thorough irrigation with 4 L of irrigating solution was performed.  The components were then cemented into position with same stability and tracking parameters maintained.  Tourniquet was released.  Bleeding points were controlled using electrocautery. Thorough irrigation again performed.  Median parapatellar arthrotomy was made.  It was closed with the knee over a bolster using #1 Vicryl suture, 0 Vicryl suture, 2-0 Vicryl suture, and skin staples.  Bulky dressing and knee immobilizer were placed.  The patient tolerated the procedure well without immediate complications.  She was transferred to recovery room in stable condition.  Brian Parker's assistance was required at all times during the case for retraction, protection of neurovascular structures, opening and closing, her assistance was a medical necessity.     Burnard Bunting, M.D.     GSD/MEDQ  D:  03/24/2013  T:  03/25/2013  Job:  [id number]

## 2013-03-25 NOTE — Progress Notes (Signed)
UR COMPLETED  

## 2013-03-25 NOTE — Progress Notes (Signed)
Referred to this CSW today for ?SNF. Chart reviewed and have spoken with patient who states he plans d/c to home at d/c.  RNCM advised and to assist with home with [facility] and DME. CSW to sign off- please contact us if SW needs arise. Reece Levy, MSW, Theresia Majors [phone]

## 2013-03-25 NOTE — Progress Notes (Signed)
Subjective: Pt stable - pain controlled   Objective: Vital signs in last 24 hours: Temp:  [97.8 F (36.6 C)-99 F (37.2 C)] 98.3 F (36.8 C) (03/26 0539) Pulse Rate:  [50-84] 74 (03/26 0539) Resp:  [9-20] 15 (03/26 0539) BP: (100-147)/(62-94) 100/64 mmHg (03/26 0539) SpO2:  [97 %-100 %] 97 % (03/26 0539) Weight:  [106.9 kg (235 lb 10.8 oz)] 106.9 kg (235 lb 10.8 oz) (03/25 1626)  Intake/Output from previous day: 03/25 0701 - 03/26 0700 In: 1800 [I.V.:1800] Out: 1450 [Urine:1350; Blood:100] Intake/Output this shift:    Exam:  Neurologically intact Sensation intact distally Intact pulses distally Dorsiflexion/Plantar flexion intact  Labs:  Recent Labs  03/25/13 0434  HGB 11.5*    Recent Labs  03/25/13 0434  WBC 9.5  RBC 3.81*  HCT 32.8*  PLT 152    Recent Labs  03/25/13 0434  NA 140  K 4.0  CL 105  CO2 26  BUN 23  CREATININE 1.30  GLUCOSE 132*  CALCIUM 7.8*    Recent Labs  03/25/13 0434  INR 1.13    Assessment/Plan: Pt doing well - PT and CPM today - possible dc Friday - on coumadin   Gissel Keilman SCOTT 03/25/2013, 8:05 AM

## 2013-03-26 ENCOUNTER — Encounter (HOSPITAL_COMMUNITY): Payer: Self-pay | Admitting: General Practice

## 2013-03-26 LAB — CBC
HCT: 28.8 % — ABNORMAL LOW (ref 39.0–52.0)
MCH: 30.5 pg (ref 26.0–34.0)
MCHC: 35.4 g/dL (ref 30.0–36.0)
MCV: 86.2 fL (ref 78.0–100.0)
RDW: 13.4 % (ref 11.5–15.5)

## 2013-03-26 MED ORDER — HYDROMORPHONE HCL 2 MG PO TABS
2.0000 mg | ORAL_TABLET | ORAL | Status: DC | PRN
  Administered 2013-03-26 – 2013-03-27 (×6): 2 mg via ORAL
  Filled 2013-03-26 (×6): qty 1

## 2013-03-26 MED ORDER — WARFARIN SODIUM 7.5 MG PO TABS
7.5000 mg | ORAL_TABLET | Freq: Once | ORAL | Status: AC
  Administered 2013-03-26: 7.5 mg via ORAL
  Filled 2013-03-26: qty 1

## 2013-03-26 NOTE — Progress Notes (Signed)
Physical Therapy Treatment Patient Details Name: Brian Parker MRN: [id number] DOB: March 11, 1950 Today's Date: 03/26/2013 Time: 4696-2952 PT Time Calculation (min): 45 min  PT Assessment / Plan / Recommendation Comments on Treatment Session  Pt doing well with mobility.  Will continue to progress ROM and strength.  Should be safe to d/c home tomorrow.     Follow Up Recommendations  Home health PT     Does the patient have the potential to tolerate intense rehabilitation     Barriers to Discharge        Equipment Recommendations  None recommended by PT    Recommendations for Other Services    Frequency 7X/week   Plan Discharge plan remains appropriate;Frequency remains appropriate    Precautions / Restrictions Precautions Precautions: Knee Precaution Booklet Issued: No Required Braces or Orthoses: Knee Immobilizer - Left Knee Immobilizer - Left: On when out of bed or walking Restrictions Weight Bearing Restrictions: Yes LLE Weight Bearing: Weight bearing as tolerated   Pertinent Vitals/Pain 6/10 pain in knee applied ice and placed pt in CPM>      Mobility  Bed Mobility Bed Mobility: Sit to Supine Sitting - Scoot to Edge of Bed: 6: Modified independent (Device/Increase time) Sit to Supine: 6: Modified independent (Device/Increase time) Details for Bed Mobility Assistance: Pt able to manage LLE independently pulling on KI Transfers Transfers: Sit to Stand;Stand to Sit Sit to Stand: 6: Modified independent (Device/Increase time);From chair/3-in-1;With upper extremity assist Stand to Sit: 6: Modified independent (Device/Increase time);To bed;To chair/3-in-1;With upper extremity assist Stand Pivot Transfers: 6: Modified independent (Device/Increase time) Details for Transfer Assistance: no cueing required.  Ambulation/Gait Ambulation/Gait Assistance: 5: Supervision Ambulation Distance (Feet): 150 Feet Assistive device: Rolling walker Ambulation/Gait Assistance Details: VCs  to increase gait speed.  Gait Pattern: Step-to pattern;Decreased stance time - left Stairs: Yes Stairs Assistance: 5: Supervision Stairs Assistance Details (indicate cue type and reason): instructed pt to negotiate stairs with one rail and RW.  Stair Management Technique: One rail Left;Forwards;With walker Number of Stairs: 8 Wheelchair Mobility Wheelchair Mobility: No    Exercises     PT Diagnosis:    PT Problem List:   PT Treatment Interventions:     PT Goals Acute Rehab PT Goals PT Goal Formulation: With patient Time For Goal Achievement: 04/01/13 Potential to Achieve Goals: Good Pt will go Supine/Side to Sit: with modified independence PT Goal: Supine/Side to Sit - Progress: Met Pt will go Sit to Supine/Side: with modified independence PT Goal: Sit to Supine/Side - Progress: Met Pt will Transfer Bed to Chair/Chair to Bed: with modified independence PT Transfer Goal: Bed to Chair/Chair to Bed - Progress: Met Pt will Ambulate: >150 feet;with modified independence;with rolling walker PT Goal: Ambulate - Progress: Met Pt will Go Up / Down Stairs: Flight;with supervision;with rail(s);with least restrictive assistive device PT Goal: Up/Down Stairs - Progress: Met Pt will Perform Home Exercise Program: Independently PT Goal: Perform Home Exercise Program - Progress: Progressing toward goal  Visit Information  Last PT Received On: 03/26/13    Subjective Data  Subjective: I feel better Patient Stated Goal: walk without pain.    Cognition  Cognition Overall Cognitive Status: Appears within functional limits for tasks assessed/performed Arousal/Alertness: Awake/alert Orientation Level: Oriented X4 / Intact Behavior During Session: [facility] for tasks performed    Balance  Balance Balance Assessed: No  End of Session PT - End of Session Equipment Utilized During Treatment: Gait belt;Left knee immobilizer Activity Tolerance: Patient tolerated treatment well Patient left: in  bed;with call bell/phone within reach;with family/visitor present Nurse Communication: Mobility status CPM Left Knee CPM Left Knee: On Left Knee Flexion (Degrees): 40 Left Knee Extension (Degrees): 0   GP     Traveion Ruddock 03/26/2013, 6:22 PM Deniz Eskridge L. Taheerah Guldin DPT [phone]

## 2013-03-26 NOTE — Care Management Note (Signed)
CARE MANAGEMENT NOTE 03/26/2013  Patient:  JOSSUE, RUBENSTEIN   Account Number:  [id number]  Date Initiated:  03/26/2013  Documentation initiated by:  Vance Peper  Subjective/Objective Assessment:   63 yr old male s/p left total knee arthroplasty.     Action/Plan:   Patient preoperatively setup with [facility], no changes. rolling walker, 3in1 and CPM have been delivered to patient's home. Has family support at discharge.   Anticipated DC Date:  03/27/2013   Anticipated DC Plan:  HOME W HOME HEALTH SERVICES      DC Planning Services  CM consult      [facility] Choice  HOME HEALTH   Choice offered to / List presented to:  C-1 Patient      DME agency  [employer]     HH arranged  HH-2 PT  HH-1 RN      [facility] agency  [facility]   Status of service:  Completed, signed off Medicare Important Message given?   (If response is "NO", the following Medicare IM given date fields will be blank) Date Medicare IM given:   Date Additional Medicare IM given:    Discharge Disposition:  SKILLED NURSING FACILITY  Per UR Regulation:    If discussed at Long Length of Stay Meetings, dates discussed:    Comments:

## 2013-03-26 NOTE — Progress Notes (Signed)
Physical Therapy Treatment Patient Details Name: Brian Parker MRN: [id number] DOB: 19-Oct-1950 Today's Date: 03/26/2013 Time: 4098-1191 PT Time Calculation (min): 62 min  PT Assessment / Plan / Recommendation Comments on Treatment Session  Pt was unable to ambulate after exercise secondary to c/o dizzineess.      Follow Up Recommendations  Home health PT     Does the patient have the potential to tolerate intense rehabilitation     Barriers to Discharge        Equipment Recommendations  None recommended by PT    Recommendations for Other Services    Frequency 7X/week   Plan Discharge plan remains appropriate;Frequency remains appropriate    Precautions / Restrictions Precautions Precautions: Knee Precaution Booklet Issued: No Required Braces or Orthoses: Knee Immobilizer - Left Knee Immobilizer - Left: On when out of bed or walking Restrictions Weight Bearing Restrictions: Yes LLE Weight Bearing: Weight bearing as tolerated   Pertinent Vitals/Pain 8/10 pain in knee. Applied gel ice packs to knee for pain and edema control.  RN to medicate pt at conclusion of session.     Mobility  Bed Mobility Bed Mobility: Sit to Supine Supine to Sit: HOB flat;5: Supervision Sitting - Scoot to Edge of Bed: 5: Supervision Sit to Supine: 5: Supervision Details for Bed Mobility Assistance: used leg lfiter for L LE into bed Transfers Transfers: Sit to Stand;Stand to Sit;Stand Pivot Transfers Sit to Stand: 5: Supervision;From bed;From chair/3-in-1;With upper extremity assist Stand to Sit: 5: Supervision;To chair/3-in-1;To bed Stand Pivot Transfers: 5: Supervision Details for Transfer Assistance: supervision for safety.  Ambulation/Gait Ambulation/Gait Assistance: Not tested (comment) Stairs: Yes Stairs Assistance: 5: Supervision Stairs Assistance Details (indicate cue type and reason): instructed pt to negotiate stair.s  Stair Management Technique: Forwards;Two rails Number of  Stairs: 3 Wheelchair Mobility Wheelchair Mobility: No    Exercises Total Joint Exercises Ankle Circles/Pumps: Both;10 reps Quad Sets: Both;10 reps Short Arc Quad: Left;10 reps;Supine;AAROM;AROM Heel Slides: AAROM;10 reps;PROM;Left Straight Leg Raises: 5 reps;Left;AROM;AAROM Knee Flexion: 5 reps;Left;Seated Goniometric ROM: 0-70 degrees PROM   PT Diagnosis:    PT Problem List:   PT Treatment Interventions:     PT Goals Acute Rehab PT Goals PT Goal Formulation: With patient Time For Goal Achievement: 04/01/13 Potential to Achieve Goals: Good Pt will go Supine/Side to Sit: with modified independence PT Goal: Supine/Side to Sit - Progress: Progressing toward goal Pt will go Sit to Supine/Side: with modified independence PT Goal: Sit to Supine/Side - Progress: Progressing toward goal Pt will Transfer Bed to Chair/Chair to Bed: with modified independence PT Transfer Goal: Bed to Chair/Chair to Bed - Progress: Progressing toward goal Pt will Ambulate: >150 feet;with modified independence;with rolling walker Pt will Go Up / Down Stairs: Flight;with supervision;with rail(s);with least restrictive assistive device PT Goal: Up/Down Stairs - Progress: Progressing toward goal Pt will Perform Home Exercise Program: Independently PT Goal: Perform Home Exercise Program - Progress: Progressing toward goal  Visit Information  Last PT Received On: 03/26/13    Subjective Data  Subjective: My knee hurts worse than this morning.  Patient Stated Goal: walk without pain.    Cognition  Cognition Overall Cognitive Status: Appears within functional limits for tasks assessed/performed Arousal/Alertness: Awake/alert Orientation Level: Oriented X4 / Intact Behavior During Session: [facility] for tasks performed    Balance  Balance Balance Assessed: No  End of Session PT - End of Session Equipment Utilized During Treatment: Gait belt;Left knee immobilizer Activity Tolerance: Patient limited by  pain Patient left: in  chair;with call bell/phone within reach Nurse Communication: Mobility status CPM Left Knee CPM Left Knee: Off   GP     Sherree Shankman 03/26/2013, 2:58 PM Deddrick Saindon L. Damica Gravlin DPT [phone]

## 2013-03-26 NOTE — Progress Notes (Signed)
Subjective: Pain controlled mod well - will change to oral dilaudid   Objective: Vital signs in last 24 hours: Temp:  [98.2 F (36.8 C)-99.4 F (37.4 C)] 99.4 F (37.4 C) (03/26 2106) Pulse Rate:  [75-92] 92 (03/26 2106) Resp:  [16-20] 20 (03/26 2106) BP: (123-137)/(69-75) 137/75 mmHg (03/26 2106) SpO2:  [97 %-99 %] 99 % (03/26 2106)  Intake/Output from previous day: 03/26 0701 - 03/27 0700 In: 240 [P.O.:240] Out: -  Intake/Output this shift:    Exam:  Neurovascular intact Sensation intact distally Intact pulses distally Dorsiflexion/Plantar flexion intact  Labs:  Recent Labs  03/25/13 0434  HGB 11.5*    Recent Labs  03/25/13 0434  WBC 9.5  RBC 3.81*  HCT 32.8*  PLT 152    Recent Labs  03/25/13 0434  NA 140  K 4.0  CL 105  CO2 26  BUN 23  CREATININE 1.30  GLUCOSE 132*  CALCIUM 7.8*    Recent Labs  03/25/13 0434 03/26/13 0539  INR 1.13 1.28    Assessment/Plan: Plan PT cpm today   Margart Zemanek SCOTT 03/26/2013, 8:04 AM

## 2013-03-26 NOTE — Progress Notes (Signed)
PHYSICAL THERAPY PROGRESS NOTE.  03/26/2013  Pt presents with significant ROM limitation in L Knee. Pt would benefit from continued use of CPM upon d/c to home to maximize functional ROM potential in knee.   Artemus Romanoff L. Nea Gittens DPT [phone]

## 2013-03-26 NOTE — Progress Notes (Signed)
OT Cancellation Note  Patient Details Name: Brian Parker MRN: [id number] DOB: 20-Sep-1950   Cancelled Treatment:    Reason Eval/Treat Not Completed: Other (comment) (Pt does not feel he needs further OT).  Pt. Reports he feels comfortable with LB ADLs and functional transfers.  Will sign off.  Boykin Reaper [phone] 03/26/2013, 12:55 PM

## 2013-03-26 NOTE — Progress Notes (Signed)
ANTICOAGULATION CONSULT NOTE - Follow Up Consult  Pharmacy Consult for Coumadin Indication: VTE prophylaxis  Allergies  Allergen Reactions  . Losartan     Stomach cramps.    Patient Measurements: Height: 5\' 10"  (177.8 cm) Weight: 235 lb 10.8 oz (106.9 kg) IBW/kg (Calculated) : 73  Vital Signs:    Labs:  Recent Labs  03/25/13 0434 03/26/13 0539  HGB 11.5* 10.2*  HCT 32.8* 28.8*  PLT 152 101*  LABPROT 14.3 15.7*  INR 1.13 1.28  CREATININE 1.30  --     Estimated Creatinine Clearance: 74 ml/min (by C-G formula based on Cr of 1.3).  Assessment: 62yom on Coumadin for VTE prophylaxis s/p TKA. INR (1.28) is subtherapeutic and trending up slowly  - H/H and plts all trended down - No significant bleeding reported - Warfarin points: 6  Goal of Therapy:  INR 2-3   Plan:  1. Repeat Coumadin 7.5mg  po x 1 today 2. Follow-up AM INR 3. Monitor H/H/Plt trend and s/sx of bleeding closely.  Bayard Hugger, PharmD, BCPS  Clinical Pharmacist  Pager: [phone]   03/26/2013,11:23 AM

## 2013-03-27 LAB — CBC
HCT: 27.3 % — ABNORMAL LOW (ref 39.0–52.0)
MCHC: 34.8 g/dL (ref 30.0–36.0)
MCV: 88.3 fL (ref 78.0–100.0)
RDW: 12.9 % (ref 11.5–15.5)

## 2013-03-27 MED ORDER — METHOCARBAMOL 500 MG PO TABS: 500.0000 mg | ORAL_TABLET | Freq: Four times a day (QID) | ORAL | Status: DC

## 2013-03-27 MED ORDER — WARFARIN SODIUM 5 MG PO TABS: 5.0000 mg | ORAL_TABLET | Freq: Every day | ORAL | Status: DC

## 2013-03-27 MED ORDER — HYDROMORPHONE HCL 2 MG PO TABS: 2.0000 mg | ORAL_TABLET | ORAL | Status: DC | PRN

## 2013-03-27 MED ORDER — WARFARIN SODIUM 7.5 MG PO TABS
7.5000 mg | ORAL_TABLET | Freq: Once | ORAL | Status: DC
  Filled 2013-03-27: qty 1

## 2013-03-27 NOTE — Progress Notes (Signed)
PHYSICAL THERAPY PROGRESS NOTE  03/27/13 0915  PT Visit Information  Last PT Received On 03/27/13  PT Time Calculation  PT Start Time 0915  PT Stop Time 1000  PT Time Calculation (min) 45 min  Precautions  Precautions Knee  Precaution Booklet Issued No  Required Braces or Orthoses Knee Immobilizer - Left  Knee Immobilizer - Left On when out of bed or walking  Restrictions  Weight Bearing Restrictions Yes  LLE Weight Bearing WBAT  Cognition  Overall Cognitive Status Appears within functional limits for tasks assessed/performed  Arousal/Alertness Awake/alert  Orientation Level Oriented X4 / Intact  Behavior During Session [name] for tasks performed  Bed Mobility  Bed Mobility Supine to Sit;Sit to Supine  Supine to Sit 6: Modified independent (Device/Increase time)  Sitting - Scoot to Edge of Bed 6: Modified independent (Device/Increase time)  Sit to Supine 6: Modified independent (Device/Increase time)  Details for Bed Mobility Assistance Pt able to manage LLE independently.   Transfers  Transfers Sit to Stand;Stand to Sit  Sit to Stand 6: Modified independent (Device/Increase time)  Stand to Sit 6: Modified independent (Device/Increase time);To bed;To chair/3-in-1;With upper extremity assist  Stand Pivot Transfers 6: Modified independent (Device/Increase time)  Details for Transfer Assistance no cueing required.   Ambulation/Gait  Ambulation/Gait Assistance 6: Modified independent (Device/Increase time)  Ambulation Distance (Feet) 150 Feet  Assistive device Rolling walker  Gait Pattern Step-to pattern;Decreased stance time - left  Stairs Yes  Stairs Assistance 5: Supervision  Stairs Assistance Details (indicate cue type and reason) no instruction required  Stair Management Technique One rail Left;Forwards;With walker  Number of Stairs 14  Wheelchair Mobility  Wheelchair Mobility No  PT - End of Session  Equipment Utilized During Treatment Gait belt;Left knee immobilizer   Activity Tolerance Patient tolerated treatment well  Patient left in bed;with call bell/phone within reach;with family/visitor present  Nurse Communication Mobility status  PT - Assessment/Plan  Comments on Treatment Session Pt will be seen once more to review HEP.    PT Plan Discharge plan remains appropriate;Frequency remains appropriate  PT Frequency 7X/week  Follow Up Recommendations Home health PT  PT equipment None recommended by PT  Acute Rehab PT Goals  PT Goal Formulation With patient  Time For Goal Achievement 04/01/13  Potential to Achieve Goals Good  Pt will go Supine/Side to Sit with modified independence  PT Goal: Supine/Side to Sit - Progress Met  Pt will go Sit to Supine/Side with modified independence  PT Goal: Sit to Supine/Side - Progress Met  Pt will Transfer Bed to Chair/Chair to Bed with modified independence  PT Transfer Goal: Bed to Chair/Chair to Bed - Progress Met  Pt will Ambulate >150 feet;with modified independence;with rolling walker  PT Goal: Ambulate - Progress Met  Pt will Go Up / Down Stairs Flight;with supervision;with rail(s);with least restrictive assistive device  PT Goal: Up/Down Stairs - Progress Met  Pt will Perform Home Exercise Program Independently  PT Goal: Perform Home Exercise Program - Progress Progressing toward goal  PT General Charges  $$ ACUTE PT VISIT 1 Procedure  PT Treatments  $Gait Training 23-37 mins  $Therapeutic Activity 8-22 mins  [name] L. Stefan Markarian DPT [phone]

## 2013-03-27 NOTE — Progress Notes (Signed)
Physical Therapy Treatment Patient Details Name: Brian Parker MRN: [id number] DOB: [birth year] Today's Date: 03/27/2013 Time: 1410-1440 PT Time Calculation (min): 30 min  PT Assessment / Plan / Recommendation Comments on Treatment Session  Pt has met all acute PT goals.      Follow Up Recommendations  Home health PT     Does the patient have the potential to tolerate intense rehabilitation     Barriers to Discharge        Equipment Recommendations  None recommended by PT    Recommendations for Other Services    Frequency 7X/week   Plan All goals met and education completed, patient dischaged from PT services    Precautions / Restrictions Precautions Precautions: Knee Precaution Booklet Issued: No Restrictions Weight Bearing Restrictions: Yes LLE Weight Bearing: Weight bearing as tolerated   Pertinent Vitals/Pain 2/10 pain in Knee    Mobility  Bed Mobility Bed Mobility: Not assessed Transfers Transfers: Not assessed Ambulation/Gait Ambulation/Gait Assistance: Not tested (comment)    Exercises Total Joint Exercises Ankle Circles/Pumps: Both;10 reps Quad Sets: Both;10 reps Short Arc Quad: Left;10 reps;Supine;AAROM;AROM Heel Slides: AAROM;10 reps;PROM;Left Straight Leg Raises: Left;AROM;10 reps;AAROM Knee Flexion: 5 reps;Left;Seated Goniometric ROM: 0-65 Degrees AAROM   PT Diagnosis:    PT Problem List:   PT Treatment Interventions:     PT Goals Acute Rehab PT Goals PT Goal Formulation: With patient Time For Goal Achievement: 04/01/13 Potential to Achieve Goals: Good Pt will Perform Home Exercise Program: Independently PT Goal: Perform Home Exercise Program - Progress: Met  Visit Information  Last PT Received On: 03/27/13    Subjective Data      Cognition  Cognition Overall Cognitive Status: Appears within functional limits for tasks assessed/performed Arousal/Alertness: Awake/alert Orientation Level: Oriented X4 / Intact Behavior During Session:  [facility] for tasks performed    Balance     End of Session PT - End of Session Equipment Utilized During Treatment: Gait belt;Left knee immobilizer Activity Tolerance: Patient tolerated treatment well Patient left: in bed;with call bell/phone within reach;with family/visitor present Nurse Communication: Mobility status   GP     Sol Odor 03/27/2013, 4:44 PM Jacquelina Hewins L. Makyia Erxleben DPT [phone]

## 2013-03-27 NOTE — Progress Notes (Signed)
MD-- Pt had fever of 101.2  this am at 0500, tylenol given, temp down to 98.0 at 0600.

## 2013-03-27 NOTE — Progress Notes (Signed)
Subjective: Pt stable - cpm only to 50 - pain controlled on dilaudid   Objective: Vital signs in last 24 hours: Temp:  [98 F (36.7 C)-101.2 F (38.4 C)] 98 F (36.7 C) (03/28 0629) Pulse Rate:  [80-92] 81 (03/28 0947) Resp:  [18-20] 18 (03/28 0629) BP: (112-144)/(62-70) 118/70 mmHg (03/28 0947) SpO2:  [97 %-98 %] 97 % (03/28 0629)  Intake/Output from previous day: 03/27 0701 - 03/28 0700 In: 120 [P.O.:120] Out: 1100 [Urine:1100] Intake/Output this shift: Total I/O In: 480 [P.O.:480] Out: 550 [Urine:550]  Exam:  Neurovascular intact Sensation intact distally Intact pulses distally Dorsiflexion/Plantar flexion intact  Labs:  Recent Labs  03/25/13 0434 03/26/13 0539 03/27/13 0612  HGB 11.5* 10.2* 9.5*    Recent Labs  03/26/13 0539 03/27/13 0612  WBC 6.7 6.9  RBC 3.34* 3.09*  HCT 28.8* 27.3*  PLT 101* 104*    Recent Labs  03/25/13 0434  NA 140  K 4.0  CL 105  CO2 26  BUN 23  CREATININE 1.30  GLUCOSE 132*  CALCIUM 7.8*    Recent Labs  03/26/13 0539 03/27/13 0612  INR 1.28 1.44    Assessment/Plan: Plan dc today after PT - needs CPM as a medical necessity because of slow regaining of flexion - needs hhc and hhpt   Duane Trias SCOTT 03/27/2013, 11:28 AM

## 2013-03-27 NOTE — Progress Notes (Signed)
ANTICOAGULATION CONSULT NOTE - Follow Up Consult  Pharmacy Consult for Coumadin Indication: VTE prophylaxis  Allergies  Allergen Reactions  . Losartan     Stomach cramps.    Patient Measurements: Height: 5\' 10"  (177.8 cm) Weight: 235 lb 10.8 oz (106.9 kg) IBW/kg (Calculated) : 73 Heparin Dosing Weight:   Vital Signs: Temp: 98 F (36.7 C) (03/28 0629) Temp src: Oral (03/28 0629) BP: 118/70 mmHg (03/28 0947) Pulse Rate: 81 (03/28 0947)  Labs:  Recent Labs  03/25/13 0434 03/26/13 0539 03/27/13 0612  HGB 11.5* 10.2* 9.5*  HCT 32.8* 28.8* 27.3*  PLT 152 101* 104*  LABPROT 14.3 15.7* 17.2*  INR 1.13 1.28 1.44  CREATININE 1.30  --   --     Estimated Creatinine Clearance: 74 ml/min (by C-G formula based on Cr of 1.3).  Assessment: 62yom on Coumadin for VTE prophylaxis s/p TKA. INR (1.44) is subtherapeutic but continues to trend up. - H/H trending down, Plts low but stable - No significant bleeding reported  Goal of Therapy:  INR 2-3   Plan:  1. Repeat Coumadin 7.5mg  po x 1 today 2. Follow-up AM INR  Cleon Dew [phone] 03/27/2013,10:43 AM

## 2013-04-16 ENCOUNTER — Ambulatory Visit: Payer: BC Managed Care – PPO | Attending: Orthopedic Surgery | Admitting: Physical Therapy

## 2013-04-16 DIAGNOSIS — IMO0001 Reserved for inherently not codable concepts without codable children: Secondary | ICD-10-CM | POA: Insufficient documentation

## 2013-04-16 DIAGNOSIS — M25669 Stiffness of unspecified knee, not elsewhere classified: Secondary | ICD-10-CM | POA: Insufficient documentation

## 2013-04-16 DIAGNOSIS — M25569 Pain in unspecified knee: Secondary | ICD-10-CM | POA: Insufficient documentation

## 2013-04-20 NOTE — Discharge Summary (Signed)
Physician Discharge Summary  Patient ID: Brian Parker MRN: [id number] DOB/AGE: 08/30/1950 63 y.o.  Admit date: 03/24/2013 Discharge date: 03/27/2013 Admission Diagnoses:  Left knee arthritis  Discharge Diagnoses:  Same  Surgeries: Procedure(s): LEFT TOTAL KNEE ARTHROPLASTY on 03/24/2013   Consultants:    Discharged Condition: Stable  Hospital Course: Brian Parker is an 64 y.o. male who was admitted 03/24/2013 with a chief complaint of left knee pain, and found to have a diagnosis of left knee arthritis.  They were brought to the operating room on 03/24/2013 and underwent the above named procedures. Tolerated the TKA well and was ambulating in hall at the time of discharge. On coumadin for dvt prophylaxis and incision ok at dc   Antibiotics given:  Anti-infectives   Start     Dose/Rate Route Frequency Ordered Stop   03/24/13 2000  ceFAZolin (ANCEF) IVPB 1 g/50 mL premix     1 g 100 mL/hr over 30 Minutes Intravenous 3 times per day 03/24/13 1618 03/25/13 0623   03/24/13 1155  ceFAZolin (ANCEF) 2-3 GM-% IVPB SOLR    Comments:  MANESS, CARRIE: cabinet override      03/24/13 1155 03/24/13 1209    .  Recent vital signs:  Filed Vitals:   03/27/13 0947  BP: 118/70  Pulse: 81  Temp:   Resp:     Recent laboratory studies:  Results for orders placed during the hospital encounter of 03/24/13  PROTIME-INR      Result Value Range   Prothrombin Time 14.3  11.6 - 15.2 seconds   INR 1.13  0.00 - 1.49  CBC      Result Value Range   WBC 9.5  4.0 - 10.5 K/uL   RBC 3.81 (*) 4.22 - 5.81 MIL/uL   Hemoglobin 11.5 (*) 13.0 - 17.0 g/dL   HCT 40.9 (*) 81.1 - 91.4 %   MCV 86.1  78.0 - 100.0 fL   MCH 30.2  26.0 - 34.0 pg   MCHC 35.1  30.0 - 36.0 g/dL   RDW 78.2  95.6 - 21.3 %   Platelets 152  150 - 400 K/uL  BASIC METABOLIC PANEL      Result Value Range   Sodium 140  135 - 145 mEq/L   Potassium 4.0  3.5 - 5.1 mEq/L   Chloride 105  96 - 112 mEq/L   CO2 26  19 - 32 mEq/L   Glucose, Bld  132 (*) 70 - 99 mg/dL   BUN 23  6 - 23 mg/dL   Creatinine, Ser 0.86  0.50 - 1.35 mg/dL   Calcium 7.8 (*) 8.4 - 10.5 mg/dL   GFR calc non Af Amer 58 (*) >90 mL/min   GFR calc Af Amer 67 (*) >90 mL/min  PROTIME-INR      Result Value Range   Prothrombin Time 15.7 (*) 11.6 - 15.2 seconds   INR 1.28  0.00 - 1.49  CBC      Result Value Range   WBC 6.7  4.0 - 10.5 K/uL   RBC 3.34 (*) 4.22 - 5.81 MIL/uL   Hemoglobin 10.2 (*) 13.0 - 17.0 g/dL   HCT 57.8 (*) 46.9 - 62.9 %   MCV 86.2  78.0 - 100.0 fL   MCH 30.5  26.0 - 34.0 pg   MCHC 35.4  30.0 - 36.0 g/dL   RDW 52.8  41.3 - 24.4 %   Platelets 101 (*) 150 - 400 K/uL  PROTIME-INR  Result Value Range   Prothrombin Time 17.2 (*) 11.6 - 15.2 seconds   INR 1.44  0.00 - 1.49  CBC      Result Value Range   WBC 6.9  4.0 - 10.5 K/uL   RBC 3.09 (*) 4.22 - 5.81 MIL/uL   Hemoglobin 9.5 (*) 13.0 - 17.0 g/dL   HCT 21.3 (*) 08.6 - 57.8 %   MCV 88.3  78.0 - 100.0 fL   MCH 30.7  26.0 - 34.0 pg   MCHC 34.8  30.0 - 36.0 g/dL   RDW 46.9  62.9 - 52.8 %   Platelets 104 (*) 150 - 400 K/uL    Discharge Medications:     Medication List    STOP taking these medications       ibuprofen 200 MG tablet  Commonly known as:  ADVIL,MOTRIN      TAKE these medications       amLODipine 5 MG tablet  Commonly known as:  NORVASC  Take 5 mg by mouth daily.     aspirin EC 81 MG tablet  Take 81 mg by mouth daily.     doxazosin 4 MG tablet  Commonly known as:  CARDURA  Take 1 tablet (4 mg total) by mouth at bedtime.     Fish Oil-Krill Oil Caps  Take 1 capsule by mouth daily.     hydrochlorothiazide 25 MG tablet  Commonly known as:  HYDRODIURIL  Take 1 tablet (25 mg total) by mouth daily.     HYDROmorphone 2 MG tablet  Commonly known as:  DILAUDID  Take 1 tablet (2 mg total) by mouth every 3 (three) hours as needed.     LEVITRA 20 MG tablet  Generic drug:  vardenafil  Take 20 mg by mouth daily as needed for erectile dysfunction.      methocarbamol 500 MG tablet  Commonly known as:  ROBAXIN  Take 1 tablet (500 mg total) by mouth 4 (four) times daily.     multivitamin tablet  Take 1 tablet by mouth daily.     potassium chloride 10 MEQ tablet  Commonly known as:  K-DUR  Take 1 tablet (10 mEq total) by mouth daily.     warfarin 5 MG tablet  Commonly known as:  COUMADIN  Take 1 tablet (5 mg total) by mouth daily.        Diagnostic Studies: No results found.  Disposition: 06-Home-Health Care Svc      Discharge Orders   Future Appointments Provider Department Dept Phone   04/21/2013 8:30 AM Merry Proud, PT [facility] [phone]   04/24/2013 8:00 AM Tomie China, PT [facility] [place] [phone]   09/02/2013 9:45 AM [facility] [phone]   09/02/2013 10:30 AM Wl-Ct 1 [facility] [phone]   Patient to arrive 15 minutes prior to appointment time. Patient to pick up oral contrast at least 1 day prior to exam, unless otherwise instructed by your physician. No solid food 4 hours prior to exam. Liquids and Medicines are okay.   09/02/2013 11:00 AM [facility] R/F 1 [facility] [phone]   09/07/2013 3:30 PM Levert Feinstein, MD [facility] [phone]   Future Orders Complete By Expires     Call MD / Call 911  As directed     Comments:      If you experience chest pain or shortness of  breath, CALL 911 and be transported to the hospital emergency room.  If you develope a fever above 101 F, pus (white drainage) or increased drainage or redness at the wound, or calf pain, call your surgeon's office.    Constipation Prevention  As directed     Comments:      Drink plenty of fluids.  Prune juice may be helpful.  You may use a stool softener, such as Colace (over the counter) 100 mg twice a day.  Use MiraLax  (over the counter) for constipation as needed.    Diet - low sodium heart healthy  As directed     Discharge instructions  As directed     Comments:      CPM 6 hours per day Keep incision dry Weight bearing as tolerated    Face-to-face encounter (required for Medicare/Medicaid patients)  As directed     Comments:      I DEAN,GREGORY SCOTT certify that this patient is under my care and that I, or a nurse practitioner or physician's assistant working with me, had a face-to-face encounter that meets the physician face-to-face encounter requirements with this patient on 03/27/2013. The encounter with the patient was in whole, or in part for the following medical condition(s) which is the primary reason for home health care (List medical condition): tka needs pt and blood draws for coumadin    Questions:      The encounter with the patient was in whole, or in part, for the following medical condition, which is the primary reason for home health care:  TKA needs pt and coumadin monitioring    I certify that, based on my findings, the following services are medically necessary home health services:  Nursing    Physical therapy    My clinical findings support the need for the above services:  Unable to leave home safely without assistance and/or assistive device    Further, I certify that my clinical findings support that this patient is homebound due to:  Unable to leave home safely without assistance    Reason for Medically Necessary Home Health Services:  Skilled Nursing- Changes in Medication/Medication Management    Home Health  As directed     Questions:      To provide the following care/treatments:  PT    RN    Increase activity slowly as tolerated  As directed           Signed: DEAN,GREGORY SCOTT 04/20/2013, 2:11 PM

## 2013-04-21 ENCOUNTER — Other Ambulatory Visit: Payer: Self-pay | Admitting: *Deleted

## 2013-04-21 ENCOUNTER — Ambulatory Visit: Payer: BC Managed Care – PPO | Admitting: Physical Therapy

## 2013-04-21 MED ORDER — POTASSIUM CHLORIDE ER 10 MEQ PO TBCR: 10.0000 meq | EXTENDED_RELEASE_TABLET | Freq: Every day | ORAL | Status: DC

## 2013-04-21 MED ORDER — AMLODIPINE BESYLATE 5 MG PO TABS: 5.0000 mg | ORAL_TABLET | Freq: Every day | ORAL | Status: DC

## 2013-04-21 NOTE — Telephone Encounter (Signed)
Fax Received. Refill Completed. Brian Parker (R.M.A)   

## 2013-04-21 NOTE — Telephone Encounter (Signed)
Fax Received. Refill Completed. Keontre Defino Chowoe (R.M.A)   

## 2013-04-24 ENCOUNTER — Ambulatory Visit: Payer: BC Managed Care – PPO | Admitting: Physical Therapy

## 2013-04-28 ENCOUNTER — Ambulatory Visit: Payer: BC Managed Care – PPO | Admitting: Physical Therapy

## 2013-04-30 ENCOUNTER — Ambulatory Visit: Payer: BC Managed Care – PPO | Attending: Orthopedic Surgery | Admitting: Physical Therapy

## 2013-04-30 DIAGNOSIS — IMO0001 Reserved for inherently not codable concepts without codable children: Secondary | ICD-10-CM | POA: Insufficient documentation

## 2013-04-30 DIAGNOSIS — M25569 Pain in unspecified knee: Secondary | ICD-10-CM | POA: Insufficient documentation

## 2013-04-30 DIAGNOSIS — M25669 Stiffness of unspecified knee, not elsewhere classified: Secondary | ICD-10-CM | POA: Insufficient documentation

## 2013-05-05 ENCOUNTER — Ambulatory Visit: Payer: BC Managed Care – PPO | Admitting: Physical Therapy

## 2013-05-21 ENCOUNTER — Ambulatory Visit: Payer: BC Managed Care – PPO | Admitting: Physical Therapy

## 2013-05-22 ENCOUNTER — Ambulatory Visit: Payer: BC Managed Care – PPO | Admitting: Physical Therapy

## 2013-08-25 ENCOUNTER — Other Ambulatory Visit: Payer: Self-pay | Admitting: Cardiovascular Disease

## 2013-09-02 ENCOUNTER — Ambulatory Visit (HOSPITAL_COMMUNITY)
Admission: RE | Admit: 2013-09-02 | Discharge: 2013-09-02 | Disposition: A | Discharge status: Home / Self Care | Payer: BC Managed Care – PPO | Source: Ambulatory Visit | Attending: Oncology | Admitting: Oncology

## 2013-09-02 ENCOUNTER — Other Ambulatory Visit (HOSPITAL_BASED_OUTPATIENT_CLINIC_OR_DEPARTMENT_OTHER): Payer: BC Managed Care – PPO

## 2013-09-02 DIAGNOSIS — C8293 Follicular lymphoma, unspecified, intra-abdominal lymph nodes: Secondary | ICD-10-CM

## 2013-09-02 DIAGNOSIS — C641 Malignant neoplasm of right kidney, except renal pelvis: Secondary | ICD-10-CM

## 2013-09-02 DIAGNOSIS — R972 Elevated prostate specific antigen [PSA]: Secondary | ICD-10-CM

## 2013-09-02 DIAGNOSIS — I10 Essential (primary) hypertension: Secondary | ICD-10-CM | POA: Insufficient documentation

## 2013-09-02 DIAGNOSIS — C8589 Other specified types of non-Hodgkin lymphoma, extranodal and solid organ sites: Secondary | ICD-10-CM | POA: Insufficient documentation

## 2013-09-02 DIAGNOSIS — C7901 Secondary malignant neoplasm of right kidney and renal pelvis: Secondary | ICD-10-CM

## 2013-09-02 DIAGNOSIS — C649 Malignant neoplasm of unspecified kidney, except renal pelvis: Secondary | ICD-10-CM | POA: Insufficient documentation

## 2013-09-02 LAB — SEDIMENTATION RATE: Sed Rate: 4 mm/hr (ref 0–16)

## 2013-09-02 LAB — CBC WITH DIFFERENTIAL/PLATELET
Basophils Absolute: 0.1 10*3/uL (ref 0.0–0.1)
EOS%: 3.1 % (ref 0.0–7.0)
LYMPH%: 15.7 % (ref 14.0–49.0)
MCH: 29.6 pg (ref 27.2–33.4)
MCV: 86.8 fL (ref 79.3–98.0)
MONO%: 4.6 % (ref 0.0–14.0)
Platelets: 159 10*3/uL (ref 140–400)
RBC: 4.94 10*6/uL (ref 4.20–5.82)
RDW: 14.4 % (ref 11.0–14.6)

## 2013-09-02 LAB — LACTATE DEHYDROGENASE (CC13): LDH: 174 U/L (ref 125–245)

## 2013-09-02 LAB — COMPREHENSIVE METABOLIC PANEL (CC13)
ALT: 16 U/L (ref 0–55)
BUN: 15.5 mg/dL (ref 7.0–26.0)
CO2: 23 mEq/L (ref 22–29)
Calcium: 8.8 mg/dL (ref 8.4–10.4)
Creatinine: 1 mg/dL (ref 0.7–1.3)
Total Bilirubin: 0.51 mg/dL (ref 0.20–1.20)

## 2013-09-04 ENCOUNTER — Telehealth: Payer: Self-pay | Admitting: *Deleted

## 2013-09-04 NOTE — Telephone Encounter (Signed)
Message copied by Orbie Hurst on Fri Sep 04, 2013  5:09 PM ------      Message from: Levert Feinstein      Created: Thu Sep 03, 2013  4:23 PM       Call pt CT ongoing remission of lymphoma. CXR normal ------

## 2013-09-04 NOTE — Telephone Encounter (Signed)
Spoke with patient.  Let him know that CT shows ongoing remission of lymphoma.  And his CXR is normal.  He so appreciated the call before the weekend and he will see Dr. Cyndie Chime on Monday.

## 2013-09-07 ENCOUNTER — Telehealth: Payer: Self-pay | Admitting: Oncology

## 2013-09-07 ENCOUNTER — Ambulatory Visit (HOSPITAL_BASED_OUTPATIENT_CLINIC_OR_DEPARTMENT_OTHER): Payer: BC Managed Care – PPO | Admitting: Oncology

## 2013-09-07 VITALS — BP 140/91 | HR 67 | Temp 97.9°F | Resp 18 | Ht 70.0 in | Wt 227.8 lb

## 2013-09-07 DIAGNOSIS — C649 Malignant neoplasm of unspecified kidney, except renal pelvis: Secondary | ICD-10-CM

## 2013-09-07 DIAGNOSIS — I10 Essential (primary) hypertension: Secondary | ICD-10-CM

## 2013-09-07 DIAGNOSIS — C641 Malignant neoplasm of right kidney, except renal pelvis: Secondary | ICD-10-CM

## 2013-09-07 DIAGNOSIS — M1710 Unilateral primary osteoarthritis, unspecified knee: Secondary | ICD-10-CM

## 2013-09-07 DIAGNOSIS — C8589 Other specified types of non-Hodgkin lymphoma, extranodal and solid organ sites: Secondary | ICD-10-CM

## 2013-09-07 DIAGNOSIS — L988 Other specified disorders of the skin and subcutaneous tissue: Secondary | ICD-10-CM

## 2013-09-07 DIAGNOSIS — C7901 Secondary malignant neoplasm of right kidney and renal pelvis: Secondary | ICD-10-CM

## 2013-09-07 NOTE — Progress Notes (Signed)
Hematology and Oncology Follow Up Visit  Brian Parker [id number] November 09, 1950 63 y.o. 09/07/2013 7:31 PM   Principle Diagnosis: Encounter Diagnoses  Name Primary?  . Cancer of kidney, secondary, right Yes  . Kidney carcinoma, right      Interim History:   Followup visit for this 63 year old man with a history of multiply relapsed low-grade, B-cell, non-Hodgkin's lymphoma and second primary kidney cancer. Please see my summary note dated 03/06/2013 for complete details of previous diagnosis and treatments. He had a persistent area of retroperitoneal lymphadenopathy after initial treatments which began to grow and was PET avid. There were no other areas of active disease. I referred him to radiation oncology and he was treated with involved field radiation 3000 cGy in 15 fractions between January 16 and 02/04/2012. He achieved a complete radiographic response which has been maintained now for almost 2 years. Current scan done on 09/02/2013 in anticipation of this visit, which I personally reviewed, shows ongoing remission status.  He is doing well. He has had no interim medical problems. He still gets some intermittent vague right flank pain nothing persistent or progressive.  His  83 year old father just died about 6 weeks ago following complications which developed after a fall.    Medications: reviewed  Allergies:  Allergies  Allergen Reactions  . Losartan     Stomach cramps.    Review of Systems: Constitutional:  No constitutional symptoms  HEENT no sore throat Respiratory: No cough or dyspnea Cardiovascular:  No chest pain or palpitations Gastrointestinal: No abdominal pain Genito-Urinary: No urinary tract symptoms. Recent followup with his urologist. Chronically elevated PSA he was lower than her previous values now down to 3.9 Musculoskeletal: See above. He had a left total knee replacement by Dr. August Saucer on 03/24/2013 and has completely recovered. Neurologic: No headache or  change in vision Skin: No rash Remaining ROS negative.    Physical Exam: Blood pressure 140/91, pulse 67, temperature 97.9 F (36.6 C), temperature source Oral, resp. rate 18, height 5\' 10"  (1.778 m), weight 227 lb 12.8 oz (103.329 kg). Wt Readings from Last 3 Encounters:  09/07/13 227 lb 12.8 oz (103.329 kg)  03/24/13 235 lb 10.8 oz (106.9 kg)  03/24/13 235 lb 10.8 oz (106.9 kg)     General appearance: Well-nourished Caucasian man HENNT: Pharynx no erythema or exudate Lymph nodes: No cervical, supraclavicular, axillary, or inguinal adenopathy Breasts: Lungs: Clear to auscultation resonant to percussion Heart: Regular rhythm no murmur Abdomen: Soft, nontender, no mass, no organomegaly Extremities: No edema, no calf tenderness Musculoskeletal: Healed scar left knee status post recent total knee replacement GU: Vascular: No carotid bruits, no cyanosis Neurologic: No focal deficit Skin: No rash or ecchymosis  Lab Results: Lab Results  Component Value Date   WBC 4.7 09/02/2013   HGB 14.6 09/02/2013   HCT 42.9 09/02/2013   MCV 86.8 09/02/2013   PLT 159 09/02/2013     Chemistry      Component Value Date/Time   NA 141 09/02/2013 0951   NA 140 03/25/2013 0434   NA 142 04/29/2012 0906   K 3.9 09/02/2013 0951   K 4.0 03/25/2013 0434   K 4.3 04/29/2012 0906   CL 105 03/25/2013 0434   CL 105 03/02/2013 0818   CL 99 04/29/2012 0906   CO2 23 09/02/2013 0951   CO2 26 03/25/2013 0434   CO2 28 04/29/2012 0906   BUN 15.5 09/02/2013 0951   BUN 23 03/25/2013 0434   BUN 18 04/29/2012 0906  CREATININE 1.0 09/02/2013 0951   CREATININE 1.30 03/25/2013 0434   CREATININE 1.3* 04/29/2012 0906      Component Value Date/Time   CALCIUM 8.8 09/02/2013 0951   CALCIUM 7.8* 03/25/2013 0434   CALCIUM 8.6 04/29/2012 0906   ALKPHOS 65 09/02/2013 0951   ALKPHOS 56 04/29/2012 0906   ALKPHOS 60 11/13/2011 0810   AST 18 09/02/2013 0951   AST 23 04/29/2012 0906   AST 21 11/13/2011 0810   ALT 16 09/02/2013 0951   ALT 20 04/29/2012  0906   ALT 17 11/13/2011 0810   BILITOT 0.51 09/02/2013 0951   BILITOT 0.90 04/29/2012 0906   BILITOT 0.5 11/13/2011 0810    LDH: 174 ESR: 4 mm   Radiological Studies: Ct Abdomen Pelvis Wo Contrast  09/02/2013   *RADIOLOGY REPORT*  Clinical Data: follow up relapsed lymphoma back in remission  CT ABDOMEN AND PELVIS WITHOUT CONTRAST  Technique:  Multidetector CT imaging of the abdomen and pelvis was performed following the standard protocol without intravenous contrast.  Comparison: 03/02/13  Findings: Visualized portions of the lung bases are clear.  There are no acute musculoskeletal findings and no focal osseous abnormalities suggesting metastatic disease.  Two tiny low attenuation lesions in the posterior right liver are stable, well less than a centimeter and not fully characterize but likely cysts.  Spleen gallbladder and pancreas unchanged and normal.  Adrenal glands normal.  Left kidney normal.  Bowel is normal.  Bladder is normal.  Mild nodular enlargement of the prostate is noted.  There is no ascites.  There are numerous non pathologic mesenteric lymph nodes.  The largest is seen centrally in the anterior mesentery on image number 44 and measures 7 mm in short axis.  This is borderline.  The largest retroperitoneal lymph node is seen adjacent to the aorta below the level of the left renal artery, and it measures 21 x 8 mm representing no significant interval change.  A left external iliac lymph node (previous report suggested it was on the right side the review of the images indicates it is on the left) is stable at 7 mm.  IMPRESSION: No evidence of recurrence.  Stable lymph nodes in the abdomen and pelvis.   Original Report Authenticated By: Esperanza Heir, M.D.   Dg Chest 2 View  09/02/2013   CLINICAL DATA:  Renal cancer, lymphoma. Hypertension.  EXAM: CHEST  2 VIEW  COMPARISON:  03/16/2013.  FINDINGS: Heart and mediastinal contours are within normal limits. No focal opacities or effusions. No  acute bony abnormality.  IMPRESSION: No active cardiopulmonary disease.   Electronically Signed   By: Charlett Nose   On: 09/02/2013 11:19    Impression:  #1. Localized relapse of low-grade B-cell non-Hodgkin's lymphoma  Complete response to radiation therapy given 20 months ago.  No evidence for new or progressive disease at this time.  I will repeat a CT scan again in 6 months and then decrease to annual intervals.   #2. Metachronous primary right kidney cancers, initial 1.4 cm T1a lesion status post partial nephrectomy March 2004. Second primary 1.3 cm complex lesion, status post right nephrectomy 01/06/10 with findings of a 1.5 cm grade 3 of 4 renal cell carcinoma with invasion into the renal capsule but no vascular invasion.   #3. History of chronically elevated PSA in the range of 6 units. Repetitive biopsies to date negative for cancer. Ongoing follow up with his urologist.  PSA done approximately 7/14 down to 3.9  #4. Essential hypertension. Currently  on diuretic alone   #5. Hyperpigmented macules between the toes of his left foot.  I forgot to ask him whether he followed up with his dermatologist.  #6. Degenerative arthritis status post recent left total knee replacement 03/24/2013     CC:. Dr. Sigmund Hazel; Dr. Wylene Simmer; Dr. Chipper Herb;  Dr. Rhea Pink        Levert Feinstein, MD 9/8/20147:31 PM

## 2013-09-07 NOTE — Telephone Encounter (Signed)
Gave pt appt for lab and MD , pt will come by for oral contrast

## 2013-09-11 ENCOUNTER — Telehealth: Payer: Self-pay | Admitting: Oncology

## 2013-09-11 NOTE — Telephone Encounter (Signed)
MAILED PT OFFICE NOTE TO DR Blayne Sharman Cheek @ [facility]

## 2013-10-17 ENCOUNTER — Other Ambulatory Visit: Payer: Self-pay | Admitting: Cardiovascular Disease

## 2013-11-05 ENCOUNTER — Other Ambulatory Visit: Payer: Self-pay

## 2013-11-12 ENCOUNTER — Encounter: Payer: Self-pay | Admitting: Cardiovascular Disease

## 2013-11-12 ENCOUNTER — Ambulatory Visit (INDEPENDENT_AMBULATORY_CARE_PROVIDER_SITE_OTHER): Payer: BC Managed Care – PPO | Admitting: Cardiovascular Disease

## 2013-11-12 VITALS — BP 144/99 | HR 54 | Ht 70.0 in | Wt 231.8 lb

## 2013-11-12 DIAGNOSIS — I10 Essential (primary) hypertension: Secondary | ICD-10-CM

## 2013-11-12 DIAGNOSIS — Z79899 Other long term (current) drug therapy: Secondary | ICD-10-CM

## 2013-11-12 MED ORDER — LISINOPRIL 10 MG PO TABS: 10.0000 mg | ORAL_TABLET | Freq: Every day | ORAL | Status: DC

## 2013-11-12 NOTE — Progress Notes (Signed)
Dortha Schwalbe Date of Birth  04-25-1950 [facility]     [place] Office  [address]    [address]   [address] [place], [place]  [address]    [place], [place]  [address] [phone]  Fax  [phone]  [phone]  Fax [phone]  1. Hypertension 2. Non Hodgkins Lymphoma  History of Present Illness:  Ganesh has done well from a cardiac standpoint.  He recently had XRT for a growing lymph node in his abdomen.   His BP has been well controlled at home.    January 29, 2013:  He continues to have issues with his non-hodgkins lymphoma.  He had XTR and has been declared in remission .  He had a  Left leg puncture would from a drill bit - never really healed well.  He recently had this would debrided and is scheduled to have a left knee replacement once this has healed.  He has not been able to exercise because of these leg issues.   Nov. 13, 2014:  Arnez is doing well. His BP has been well controlled.  His lymphoma is still in remission.  Playing golf regularly.   Current Outpatient Prescriptions on File Prior to Visit  Medication Sig Dispense Refill  . amLODipine (NORVASC) 5 MG tablet Take 1 tablet (5 mg total) by mouth daily.  30 tablet  5  . aspirin EC 81 MG tablet Take 81 mg by mouth daily.        [name] doxazosin (CARDURA) 4 MG tablet TAKE 1/2 TABLET BY MOUTH EVERY NIGHT AT BEDTIME  30 tablet  1  . Fish Oil-Krill Oil CAPS Take 1 capsule by mouth daily.      . hydrochlorothiazide (HYDRODIURIL) 25 MG tablet TAKE 1 TABLET BY MOUTH DAILY  30 tablet  0  . Multiple Vitamin (MULTIVITAMIN) tablet Take 1 tablet by mouth daily.        . potassium chloride (K-DUR) 10 MEQ tablet Take 1 tablet (10 mEq total) by mouth daily.  30 tablet  5   No current facility-administered medications on file prior to visit.  Amlodipine 5 mg a day   Allergies  Allergen Reactions  . Losartan     Stomach cramps.    Past Medical History  Diagnosis Date  . Non Hodgkin's lymphoma   .  Hypertension   . Edema of lower extremity   . Follicular lymphoma grade I of intra-abdominal lymph nodes 12/31/2006  . Elevated prostate specific antigen (PSA) 11/20/2011  . Nodular lymphoma involving intra-abdominal lymph nodes 12/20/2011  . Chest pain     INTERMITTENT/15 years ago//stress induced  . Kidney carcinoma 03/01/2003    tumor seperate from lymphoma; found during routine screening for lymphoma  . Cancer of kidney, secondary 01/06/2010    right kidney//right kidney removed//left kidney functions WDL    Past Surgical History  Procedure Laterality Date  . Nephrectomy      right kidney  . Appendectomy    . Shoulder surgery    . US echocardiography  02/26/2006    EF 55-60%  . Cardiovascular stress test  10/19/2008    EF 52%, NO ISCHEMIA  . Groin mass open biopsy    . Ventral hernia repair  2003-2004    with mesh after biopsy  . Hernia repair  2006-2007    right laparoscopic  . Hernia repair  2006-2007    left laparoscopic  . Total knee arthroplasty Left 03/25/2013    Dr  Dean  . Total knee arthroplasty Left 03/24/2013    Procedure: LEFT TOTAL KNEE ARTHROPLASTY;  Surgeon: Cammy Copa, MD;  Location: [facility] OR;  Service: Orthopedics;  Laterality: Left;  Left Total Knee Arthroplasty    History  Smoking status  . Never Smoker   Smokeless tobacco  . Never Used    History  Alcohol Use  . 4.2 oz/week  . 6 Glasses of wine, 1 Cans of beer per week    Comment: social    Family History  Problem Relation Age of Onset  . Uterine cancer Mother   . Cancer Mother     cervical  . Coronary artery disease Father   . Hypertension Father     Reviw of Systems:  Reviewed in the HPI.  All other systems are negative.  Physical Exam: Blood pressure 144/99, pulse 54, height 5\' 10"  (1.778 m), weight 231 lb 12.8 oz (105.144 kg). General: Well developed, well nourished, in no acute distress.  Head: Normocephalic, atraumatic, sclera non-icteric, mucus membranes are moist,   Neck:  Supple. Negative for carotid bruits. JVD not elevated.  Lungs: Clear bilaterally to auscultation without wheezes, rales, or rhonchi. Breathing is unlabored.  Heart: RRR with S1 S2. No murmurs, rubs, or gallops appreciated.  Abdomen: Soft, non-tender, non-distended with normoactive bowel sounds. No hepatomegaly. No rebound/guarding. No obvious abdominal masses.  Msk:  Strength and tone appear normal for age.  Extremities: No clubbing or cyanosis. No edema.  Distal pedal pulses are 2+ and equal bilaterally.  Neuro: Alert and oriented X 3. Moves all extremities spontaneously.  Psych:  Responds to questions appropriately with a normal affect.  ECG: Nov. 13 , 2014:  Sinus brady with marked sinus arrhythmia, inc. RBBB  Assessment / Plan:

## 2013-11-12 NOTE — Assessment & Plan Note (Addendum)
His BP is a bit elevated this am.    Will add lisinopril 10 mg a day.  He only has 1 kidney so we will watch his BMP closely.    2 weeks and 2 months.  i will see him in 6 months for OV and BMP

## 2013-11-12 NOTE — Patient Instructions (Addendum)
Your physician has recommended you make the following change in your medication:  Start lisinopril 10 mg daily Your physician recommends that you return for lab work in: 2 weeks and 2 months   Your physician wants you to follow-up in: 6 months  You will receive a reminder letter in the mail two months in advance. If you don't receive a letter, please call our office to schedule the follow-up appointment.

## 2013-11-30 ENCOUNTER — Other Ambulatory Visit: Payer: BC Managed Care – PPO

## 2013-12-04 ENCOUNTER — Other Ambulatory Visit (INDEPENDENT_AMBULATORY_CARE_PROVIDER_SITE_OTHER): Payer: BC Managed Care – PPO

## 2013-12-04 DIAGNOSIS — Z79899 Other long term (current) drug therapy: Secondary | ICD-10-CM

## 2013-12-04 DIAGNOSIS — I10 Essential (primary) hypertension: Secondary | ICD-10-CM

## 2013-12-04 LAB — BASIC METABOLIC PANEL
BUN: 16 mg/dL (ref 6–23)
Calcium: 8.9 mg/dL (ref 8.4–10.5)
Chloride: 101 mEq/L (ref 96–112)
Creatinine, Ser: 1.1 mg/dL (ref 0.4–1.5)

## 2013-12-10 ENCOUNTER — Other Ambulatory Visit: Payer: Self-pay | Admitting: Cardiovascular Disease

## 2014-01-12 ENCOUNTER — Other Ambulatory Visit: Payer: BC Managed Care – PPO

## 2014-03-02 ENCOUNTER — Ambulatory Visit (HOSPITAL_COMMUNITY)
Admission: RE | Admit: 2014-03-02 | Discharge: 2014-03-02 | Disposition: A | Discharge status: Home / Self Care | Payer: BC Managed Care – PPO | Source: Ambulatory Visit | Attending: Oncology | Admitting: Oncology

## 2014-03-02 ENCOUNTER — Other Ambulatory Visit (HOSPITAL_BASED_OUTPATIENT_CLINIC_OR_DEPARTMENT_OTHER): Payer: BC Managed Care – PPO

## 2014-03-02 DIAGNOSIS — Z85528 Personal history of other malignant neoplasm of kidney: Secondary | ICD-10-CM | POA: Insufficient documentation

## 2014-03-02 DIAGNOSIS — Z87898 Personal history of other specified conditions: Secondary | ICD-10-CM | POA: Insufficient documentation

## 2014-03-02 DIAGNOSIS — C8589 Other specified types of non-Hodgkin lymphoma, extranodal and solid organ sites: Secondary | ICD-10-CM

## 2014-03-02 DIAGNOSIS — C7901 Secondary malignant neoplasm of right kidney and renal pelvis: Secondary | ICD-10-CM

## 2014-03-02 DIAGNOSIS — Z923 Personal history of irradiation: Secondary | ICD-10-CM | POA: Insufficient documentation

## 2014-03-02 DIAGNOSIS — Z9221 Personal history of antineoplastic chemotherapy: Secondary | ICD-10-CM | POA: Insufficient documentation

## 2014-03-02 DIAGNOSIS — C641 Malignant neoplasm of right kidney, except renal pelvis: Secondary | ICD-10-CM

## 2014-03-02 DIAGNOSIS — I10 Essential (primary) hypertension: Secondary | ICD-10-CM | POA: Insufficient documentation

## 2014-03-02 DIAGNOSIS — C649 Malignant neoplasm of unspecified kidney, except renal pelvis: Secondary | ICD-10-CM

## 2014-03-02 LAB — CBC WITH DIFFERENTIAL/PLATELET
BASO%: 0.6 % (ref 0.0–2.0)
BASOS ABS: 0 10*3/uL (ref 0.0–0.1)
EOS ABS: 0.2 10*3/uL (ref 0.0–0.5)
EOS%: 4.6 % (ref 0.0–7.0)
HEMATOCRIT: 46.2 % (ref 38.4–49.9)
HEMOGLOBIN: 15.6 g/dL (ref 13.0–17.1)
LYMPH#: 0.7 10*3/uL — AB (ref 0.9–3.3)
LYMPH%: 14.6 % (ref 14.0–49.0)
MCH: 30 pg (ref 27.2–33.4)
MCHC: 33.8 g/dL (ref 32.0–36.0)
MCV: 88.8 fL (ref 79.3–98.0)
MONO#: 0.3 10*3/uL (ref 0.1–0.9)
MONO%: 6.6 % (ref 0.0–14.0)
NEUT#: 3.7 10*3/uL (ref 1.5–6.5)
NEUT%: 73.6 % (ref 39.0–75.0)
Platelets: 151 10*3/uL (ref 140–400)
RBC: 5.2 10*6/uL (ref 4.20–5.82)
RDW: 13.8 % (ref 11.0–14.6)
WBC: 5 10*3/uL (ref 4.0–10.3)

## 2014-03-02 LAB — SEDIMENTATION RATE: Sed Rate: 4 mm/hr (ref 0–16)

## 2014-03-02 LAB — COMPREHENSIVE METABOLIC PANEL (CC13)
ALBUMIN: 4 g/dL (ref 3.5–5.0)
ALK PHOS: 57 U/L (ref 40–150)
ALT: 18 U/L (ref 0–55)
ANION GAP: 9 meq/L (ref 3–11)
AST: 19 U/L (ref 5–34)
BUN: 14.6 mg/dL (ref 7.0–26.0)
CALCIUM: 9.4 mg/dL (ref 8.4–10.4)
CO2: 27 meq/L (ref 22–29)
Chloride: 105 mEq/L (ref 98–109)
Creatinine: 1 mg/dL (ref 0.7–1.3)
GLUCOSE: 101 mg/dL (ref 70–140)
POTASSIUM: 4 meq/L (ref 3.5–5.1)
Sodium: 141 mEq/L (ref 136–145)
Total Bilirubin: 0.49 mg/dL (ref 0.20–1.20)
Total Protein: 6.3 g/dL — ABNORMAL LOW (ref 6.4–8.3)

## 2014-03-02 LAB — LACTATE DEHYDROGENASE (CC13): LDH: 174 U/L (ref 125–245)

## 2014-03-04 ENCOUNTER — Telehealth: Payer: Self-pay | Admitting: *Deleted

## 2014-03-04 NOTE — Telephone Encounter (Signed)
Message copied by Jesse Fall on Thu Mar 04, 2014  5:05 PM ------      Message from: Annia Belt      Created: Tue Mar 02, 2014 12:05 PM       Call pt  CT stable no new areas of concern ------

## 2014-03-04 NOTE — Telephone Encounter (Signed)
Notified pt of stable CT result per Dr Beryle Beams.  Pt was able to see results on MyChart.

## 2014-03-05 ENCOUNTER — Ambulatory Visit (HOSPITAL_BASED_OUTPATIENT_CLINIC_OR_DEPARTMENT_OTHER): Payer: BC Managed Care – PPO | Admitting: Oncology

## 2014-03-05 VITALS — BP 145/94 | HR 60 | Temp 98.1°F | Resp 18 | Ht 70.0 in | Wt 239.8 lb

## 2014-03-05 DIAGNOSIS — C7900 Secondary malignant neoplasm of unspecified kidney and renal pelvis: Secondary | ICD-10-CM

## 2014-03-05 DIAGNOSIS — C8589 Other specified types of non-Hodgkin lymphoma, extranodal and solid organ sites: Secondary | ICD-10-CM

## 2014-03-05 DIAGNOSIS — I10 Essential (primary) hypertension: Secondary | ICD-10-CM

## 2014-03-05 DIAGNOSIS — R972 Elevated prostate specific antigen [PSA]: Secondary | ICD-10-CM

## 2014-03-05 DIAGNOSIS — C649 Malignant neoplasm of unspecified kidney, except renal pelvis: Secondary | ICD-10-CM

## 2014-03-07 NOTE — Progress Notes (Signed)
Hematology and Oncology Follow Up Visit  Brian Parker [id number] Dec 02, 1950 64 y.o. 03/07/2014 2:48 PM   Principle Diagnosis: Encounter Diagnoses  Name Primary?  . Cancer of kidney, secondary Yes  . Kidney carcinoma   . Elevated prostate specific antigen (PSA)      Interim History:  Followup visit for this 64 year old man initially diagnosed with a follicular grade 1,  B-cell, non-Hodgkin's lymphoma in 2003 . He has also been diagnosed with metachronous primary tumors of his right kidney and ultimately underwent initial partial nephrectomy in March 2004 then a completion nephrectomy in January 2011.. With regard to his lymphoma, he was treated with 8 cycles of CHOP/Rituxan through July 2003. He was started on maintenance rituximab but only had 2 cycles due to development of delayed neutropenia.  There was suspicion for early progression on a CT scan done in July of 2009 which showed a single area of soft tissue density in the left periaortic region. This area remained stable on serial scans until a study done on 11/13/2011 which did show clear regrowth in this area enlarging from 2.6 x 0.9 cm to approximately 4.9 x 3.4 cm. A PET scan was done on 11/30/2011 which showed a single area of markedly abnormal activity with SUV up to 10.3 over the abnormal area of lymphadenopathy seen on CT scan. There were no other areas of activity.  Given this fact, I felt it would be reasonable to offer him involved field radiation. I discussed this with Dr[facility]Rizzieri at [facility] who has been comanaging the patient with me. He agreed with this plan.  He was evaluated by Dr. Arloa Koh and received 3000 cGy in 15 fractions to the left para-aortic lymph node mass between January 16 and 02/04/2012. He tolerated treatment well.  A post treatment CT scan done 04/29/2012 which I personally reviewed showed a dramatic reduction in the lymph node mass with minimal residual soft tissue density. Subsequent followup studies  including study done in anticipation of today's visit on 03/02/2012, which I personally reviewed, have shown a further reduction down to almost imperceptible amount of soft tissue in the periaortic area. There are no new areas of concern. Incidentally noted right nephrectomy. Performance status remains excellent. Only new problem involves his left Achilles tendon which is under orthopedic care. He has had no interim infections. He continues to work full-time.   Medications: reviewed  Allergies:  Allergies  Allergen Reactions  . Losartan     Stomach cramps.    Review of Systems: Constitutional: No anorexia, fever, night sweats, or weight loss Hematology:  No bleeding or bruising ENT ROS: No sore throat Breast ROS:  Respiratory ROS: No cough or dyspnea Cardiovascular ROS:  No chest pain or palpitations Gastrointestinal ROS:   No abdominal pain or change in bowel habit Genito-Urinary ROS: No urinary tract symptoms Musculoskeletal ROS: Currently having problems with his left Achilles tendon under orthopedic care Neurological ROS: No headache or change in vision Dermatological ROS: No rash or ecchymosis Remaining ROS negative:   Physical Exam: Blood pressure 145/94, pulse 60, temperature 98.1 F (36.7 C), temperature source Oral, resp. rate 18, height 5\' 10"  (1.778 m), weight 239 lb 12.8 oz (108.773 kg), SpO2 97.00%. Wt Readings from Last 3 Encounters:  03/05/14 239 lb 12.8 oz (108.773 kg)  11/12/13 231 lb 12.8 oz (105.144 kg)  09/07/13 227 lb 12.8 oz (103.329 kg)     General appearance: Well-nourished Caucasian man HENNT: Pharynx no erythema, exudate, mass, or ulcer. No thyromegaly or thyroid  nodules Lymph nodes: No cervical, supraclavicular, or right axillary lymphadenopathy. There is an approximate 2-3 cm lymph node palpable deep in the left axilla. Breasts: Lungs: Clear to auscultation, resonant to percussion throughout Heart: Regular rhythm, no murmur, no gallop, no rub, no  click, no edema Abdomen: Soft, nontender, normal bowel sounds, no mass, no organomegaly Extremities: No edema, no calf tenderness Musculoskeletal: no joint deformities GU:  Vascular: Carotid pulses 2+, no bruits,  Neurologic: Alert, oriented, PERRLA,  , cranial nerves grossly normal, motor strength 5 over 5, reflexes 1+ symmetric, upper body coordination normal, gait normal, Skin: No rash or ecchymosis  Lab Results: CBC W/Diff    Component Value Date/Time   WBC 5.0 03/02/2014 0817   WBC 6.9 03/27/2013 0612   RBC 5.20 03/02/2014 0817   RBC 3.09* 03/27/2013 0612   HGB 15.6 03/02/2014 0817   HGB 9.5* 03/27/2013 0612   HCT 46.2 03/02/2014 0817   HCT 27.3* 03/27/2013 0612   PLT 151 03/02/2014 0817   PLT 104* 03/27/2013 0612   MCV 88.8 03/02/2014 0817   MCV 88.3 03/27/2013 0612   MCH 30.0 03/02/2014 0817   MCH 30.7 03/27/2013 0612   MCHC 33.8 03/02/2014 0817   MCHC 34.8 03/27/2013 0612   RDW 13.8 03/02/2014 0817   RDW 12.9 03/27/2013 0612   LYMPHSABS 0.7* 03/02/2014 0817   MONOABS 0.3 03/02/2014 0817   EOSABS 0.2 03/02/2014 0817   BASOSABS 0.0 03/02/2014 0817     Chemistry      Component Value Date/Time   NA 141 03/02/2014 0817   NA 138 12/04/2013 1427   NA 142 04/29/2012 0906   K 4.0 03/02/2014 0817   K 3.7 12/04/2013 1427   K 4.3 04/29/2012 0906   CL 101 12/04/2013 1427   CL 105 03/02/2013 0818   CL 99 04/29/2012 0906   CO2 27 03/02/2014 0817   CO2 31 12/04/2013 1427   CO2 28 04/29/2012 0906   BUN 14.6 03/02/2014 0817   BUN 16 12/04/2013 1427   BUN 18 04/29/2012 0906   CREATININE 1.0 03/02/2014 0817   CREATININE 1.1 12/04/2013 1427   CREATININE 1.3* 04/29/2012 0906      Component Value Date/Time   CALCIUM 9.4 03/02/2014 0817   CALCIUM 8.9 12/04/2013 1427   CALCIUM 8.6 04/29/2012 0906   ALKPHOS 57 03/02/2014 0817   ALKPHOS 56 04/29/2012 0906   ALKPHOS 60 11/13/2011 0810   AST 19 03/02/2014 0817   AST 23 04/29/2012 0906   AST 21 11/13/2011 0810   ALT 18 03/02/2014 0817   ALT 20 04/29/2012 0906   ALT 17 11/13/2011 0810    BILITOT 0.49 03/02/2014 0817   BILITOT 0.90 04/29/2012 0906   BILITOT 0.5 11/13/2011 0810       Radiological Studies: Ct Abdomen Pelvis Wo Contrast  03/02/2014   CLINICAL DATA:  History of renal cell carcinoma and non-Hodgkin's lymphoma. Chemotherapy and radiation therapy completed.  EXAM: CT ABDOMEN AND PELVIS WITHOUT CONTRAST  TECHNIQUE: Multidetector CT imaging of the abdomen and pelvis was performed following the standard protocol without intravenous contrast.  COMPARISON:  CT ABD/PELV WO CM dated 09/02/2013; CT ABD/PELV WO CM dated 03/02/2013  FINDINGS: The visualized lung bases are clear. There is no significant pleural or pericardial effusion.  7 mm low-density lesion in the right hepatic lobe on image 17 is stable. The other lesion noted previously is not well seen. No new or enlarging liver lesions are identified. The spleen is normal in size and without apparent  focal abnormality. The gallbladder and pancreas appear normal.  The right kidney is surgically absent. There is no mass within the nephrectomy bed. The left kidney and both adrenal glands appear normal.  Small retroperitoneal and mesenteric lymph nodes are stable and not pathologically enlarged. These 7 mm left external iliac node on image 67 is stable. There is stable periaortic soft tissue density inferior to the left renal vessels which may reflect fibrosis. No enlarged lymph nodes are identified. There is no ascites or peritoneal nodularity.  The stomach, small bowel and colon demonstrate no significant findings. A broad-based umbilical hernia appears stable. There is stable mild aortic atherosclerosis and central enlargement of the prostate gland which protrudes into the bladder base.  There are no worrisome osseous findings.  IMPRESSION: Stable abdominal pelvic CT with scattered small retroperitoneal and mesenteric lymph nodes. No evidence of metastatic disease or recurrent lymphoma.   Electronically Signed   By: Camie Patience M.D.   On:  03/02/2014 09:03   Dg Chest 2 View  03/02/2014   CLINICAL DATA:  History of relapsed lymphoma and kidney cancer. Hypertension.  EXAM: CHEST  2 VIEW  COMPARISON:  09/02/2013  FINDINGS: Cardiac silhouette is normal in size. The aorta is tortuous. No mediastinal or hilar masses.  Clear lungs.  No pleural effusion or pneumothorax.  Bony thorax is intact.  IMPRESSION: No active cardiopulmonary disease.   Electronically Signed   By: Lajean Manes M.D.   On: 03/02/2014 08:47    Impression:  #1. Localized relapse of low-grade B-cell non-Hodgkin's lymphoma  Complete response to radiation therapy given 2 years ago.  No evidence for new or progressive disease at this time. He has a single lymph node palpable in his left axilla. I brought this to his attention. If it should change or enlarge I would then evaluate further. I don't think the findings of this single node justify putting him back on treatment at this time. I suggested a 6 month interval followup study but he would prefer to wait until the fall.  #2. Metachronous primary right kidney cancers, initial 1.4 cm T1a lesion status post partial nephrectomy March 2004. Second primary 1.3 cm complex lesion, status post right nephrectomy 01/06/10 with findings of a 1.5 cm grade 3 of 4 renal cell carcinoma with invasion into the renal capsule but no vascular invasion.   #3. History of chronically elevated PSA in the range of 6 units. Repetitive biopsies to date negative for cancer. Ongoing follow up with his urologist.   #4. Essential hypertension. Currently on diuretic alone  I will transition his care to Dr. Alvy Bimler    CC: Patient Care Team: Marcello Fennel, MD as PCP - General (Family Medicine) Annia Belt, MD as Consulting Physician (Oncology) Rexene Edison, MD as Consulting Physician (Radiation Oncology) Claybon Jabs, MD as Consulting Physician (Urology)   Annia Belt, MD 3/8/20152:48 PM

## 2014-03-10 ENCOUNTER — Telehealth: Payer: Self-pay | Admitting: Hematology and Oncology

## 2014-03-10 NOTE — Telephone Encounter (Signed)
, °

## 2014-03-20 ENCOUNTER — Other Ambulatory Visit: Payer: Self-pay | Admitting: Cardiovascular Disease

## 2014-04-27 ENCOUNTER — Other Ambulatory Visit: Payer: Self-pay | Admitting: Cardiovascular Disease

## 2014-04-28 ENCOUNTER — Other Ambulatory Visit: Payer: Self-pay

## 2014-04-28 MED ORDER — POTASSIUM CHLORIDE ER 10 MEQ PO TBCR: 10.0000 meq | EXTENDED_RELEASE_TABLET | Freq: Every day | ORAL | Status: DC

## 2014-05-19 ENCOUNTER — Other Ambulatory Visit: Payer: BC Managed Care – PPO

## 2014-05-19 ENCOUNTER — Ambulatory Visit (INDEPENDENT_AMBULATORY_CARE_PROVIDER_SITE_OTHER): Payer: BC Managed Care – PPO | Admitting: Cardiovascular Disease

## 2014-05-19 ENCOUNTER — Encounter: Payer: Self-pay | Admitting: Cardiovascular Disease

## 2014-05-19 VITALS — BP 120/80 | HR 60 | Ht 70.0 in | Wt 234.1 lb

## 2014-05-19 DIAGNOSIS — E785 Hyperlipidemia, unspecified: Secondary | ICD-10-CM

## 2014-05-19 LAB — HEPATIC FUNCTION PANEL
ALBUMIN: 4.2 g/dL (ref 3.5–5.2)
ALT: 19 U/L (ref 0–53)
AST: 21 U/L (ref 0–37)
Alkaline Phosphatase: 51 U/L (ref 39–117)
BILIRUBIN TOTAL: 0.5 mg/dL (ref 0.2–1.2)
Bilirubin, Direct: 0.1 mg/dL (ref 0.0–0.3)
Total Protein: 6.2 g/dL (ref 6.0–8.3)

## 2014-05-19 LAB — BASIC METABOLIC PANEL
BUN: 16 mg/dL (ref 6–23)
CHLORIDE: 104 meq/L (ref 96–112)
CO2: 31 meq/L (ref 19–32)
Calcium: 9.3 mg/dL (ref 8.4–10.5)
Creatinine, Ser: 1.1 mg/dL (ref 0.4–1.5)
GFR: 69.26 mL/min (ref >60.00)
GLUCOSE: 69 mg/dL — AB (ref 70–99)
POTASSIUM: 4 meq/L (ref 3.5–5.1)
SODIUM: 141 meq/L (ref 135–145)

## 2014-05-19 LAB — LIPID PANEL
CHOL/HDL RATIO: 5
Cholesterol: 177 mg/dL (ref 0–200)
HDL: 39.1 mg/dL (ref >39.00)
LDL CALC: 107 mg/dL — AB (ref 0–99)
TRIGLYCERIDES: 154 mg/dL — AB (ref 0.0–149.0)
VLDL: 30.8 mg/dL (ref 0.0–40.0)

## 2014-05-19 NOTE — Assessment & Plan Note (Signed)
Brian Parker is doing well.  BP is great.  Will check labs today.   No Cp I will see him in 1 year.

## 2014-05-19 NOTE — Patient Instructions (Signed)
Your physician recommends that you have lab work:  TODAY  Your physician recommends that you continue on your current medications as directed. Please refer to the Current Medication list given to you today.  Your physician wants you to follow-up in: 1 year with Dr. Nahser.  You will receive a reminder letter in the mail two months in advance. If you don't receive a letter, please call our office to schedule the follow-up appointment.  

## 2014-05-19 NOTE — Progress Notes (Signed)
[name] Date of Birth  12/16/1950 [place] [address]    [place], [place]  [address]    [place], [place]  [address] [phone]  Fax  [phone]  [phone]  Fax [phone]  1. Hypertension 2. Non Hodgkins Lymphoma  History of Present Illness:  Khair has done well from a cardiac standpoint.  He recently had XRT for a growing lymph node in his abdomen.   His BP has been well controlled at home.    January 29, 2013: He continues to have issues with his non-hodgkins lymphoma.  He had XTR and has been declared in remission .  He had a  Left leg puncture would from a drill bit - never really healed well.  He recently had this would debrided and is scheduled to have a left knee replacement once this has healed.  He has not been able to exercise because of these leg issues.   Nov. 13, 2014: Kayan is doing well. His BP has been well controlled.  His lymphoma is still in remission.  Playing golf regularly.   May 19, 2014: Micha is doing well.  Playing some golf.   BP has been well controlled.    He has been playing golf in the evenings ( walks 9 holes).   He is slowing losing wome weight.      Current Outpatient Prescriptions on File Prior to Visit  Medication Sig Dispense Refill  . amLODipine (NORVASC) 5 MG tablet TAKE 1 TABLET BY MOUTH DAILY  30 tablet  6  . aspirin EC 81 MG tablet Take 81 mg by mouth daily.        [name] doxazosin (CARDURA) 4 MG tablet TAKE 1/2 TABLET BY MOUTH EVERY NIGHT AT BEDTIME  30 tablet  0  . Fish Oil-Krill Oil CAPS Take 1 capsule by mouth daily.      . hydrochlorothiazide (HYDRODIURIL) 25 MG tablet TAKE 1 TABLET BY MOUTH ONCE DAILY  30 tablet  5  . lisinopril (PRINIVIL,ZESTRIL) 10 MG tablet Take 1 tablet (10 mg total) by mouth daily.  90 tablet  3  . Multiple Vitamin (MULTIVITAMIN) tablet Take 1 tablet by mouth daily.        . potassium chloride (K-DUR) 10 MEQ tablet Take 1  tablet (10 mEq total) by mouth daily.  30 tablet  5   No current facility-administered medications on file prior to visit.  Amlodipine 5 mg a day   Allergies  Allergen Reactions  . Losartan     Stomach cramps.    Past Medical History  Diagnosis Date  . Non Hodgkin's lymphoma   . Hypertension   . Edema of lower extremity   . Follicular lymphoma grade I of intra-abdominal lymph nodes 12/31/2006  . Elevated prostate specific antigen (PSA) 11/20/2011  . Nodular lymphoma involving intra-abdominal lymph nodes 12/20/2011  . Chest pain     INTERMITTENT/15 years ago//stress induced  . Kidney carcinoma 03/01/2003    tumor seperate from lymphoma; found during routine screening for lymphoma  . Cancer of kidney, secondary 01/06/2010    right kidney//right kidney removed//left kidney functions WDL    Past Surgical History  Procedure Laterality Date  . Nephrectomy      right kidney  . Appendectomy    . Shoulder surgery    . US echocardiography  02/26/2006    EF 55-60%  . Cardiovascular stress  test  10/19/2008    EF 52%, NO ISCHEMIA  . Groin mass open biopsy    . Ventral hernia repair  2003-2004    with mesh after biopsy  . Hernia repair  2006-2007    right laparoscopic  . Hernia repair  2006-2007    left laparoscopic  . Total knee arthroplasty Left 03/25/2013    Dr Marlou Sa  . Total knee arthroplasty Left 03/24/2013    Procedure: LEFT TOTAL KNEE ARTHROPLASTY;  Surgeon: Meredith Pel, MD;  Location: [facility];  Service: Orthopedics;  Laterality: Left;  Left Total Knee Arthroplasty    History  Smoking status  . Never Smoker   Smokeless tobacco  . Never Used    History  Alcohol Use  . 4.2 oz/week  . 6 Glasses of wine, 1 Cans of beer per week    Comment: social    Family History  Problem Relation Age of Onset  . Uterine cancer Mother   . Cancer Mother     cervical  . Coronary artery disease Father   . Hypertension Father     Reviw of Systems:  Reviewed in the HPI.  All  other systems are negative.  Physical Exam: Blood pressure 120/80, pulse 60, height 5\' 10"  (1.778 m), weight 234 lb 1.9 oz (106.196 kg). General: Well developed, well nourished, in no acute distress.  Head: Normocephalic, atraumatic, sclera non-icteric, mucus membranes are moist,   Neck: Supple. Negative for carotid bruits. JVD not elevated.  Lungs: Clear bilaterally to auscultation without wheezes, rales, or rhonchi. Breathing is unlabored.  Heart: RRR with S1 S2. No murmurs, rubs, or gallops appreciated.  Abdomen: Soft, non-tender, non-distended with normoactive bowel sounds. No hepatomegaly. No rebound/guarding. No obvious abdominal masses.  Msk:  Strength and tone appear normal for age.  Extremities: No clubbing or cyanosis. No edema.  Distal pedal pulses are 2+ and equal bilaterally.  Neuro: Alert and oriented X 3. Moves all extremities spontaneously.  Psych:  Responds to questions appropriately with a normal affect.  ECG:  Assessment / Plan:

## 2014-06-21 IMAGING — CR DG CHEST 2V
2 series · 2 of 2 positions shown · non-contrast
Comparison: CT chest 03/02/2013.

CLINICAL DATA: Preoperative respiratory films.

CHEST - 2 VIEW

[view not recorded (1 of 2)]
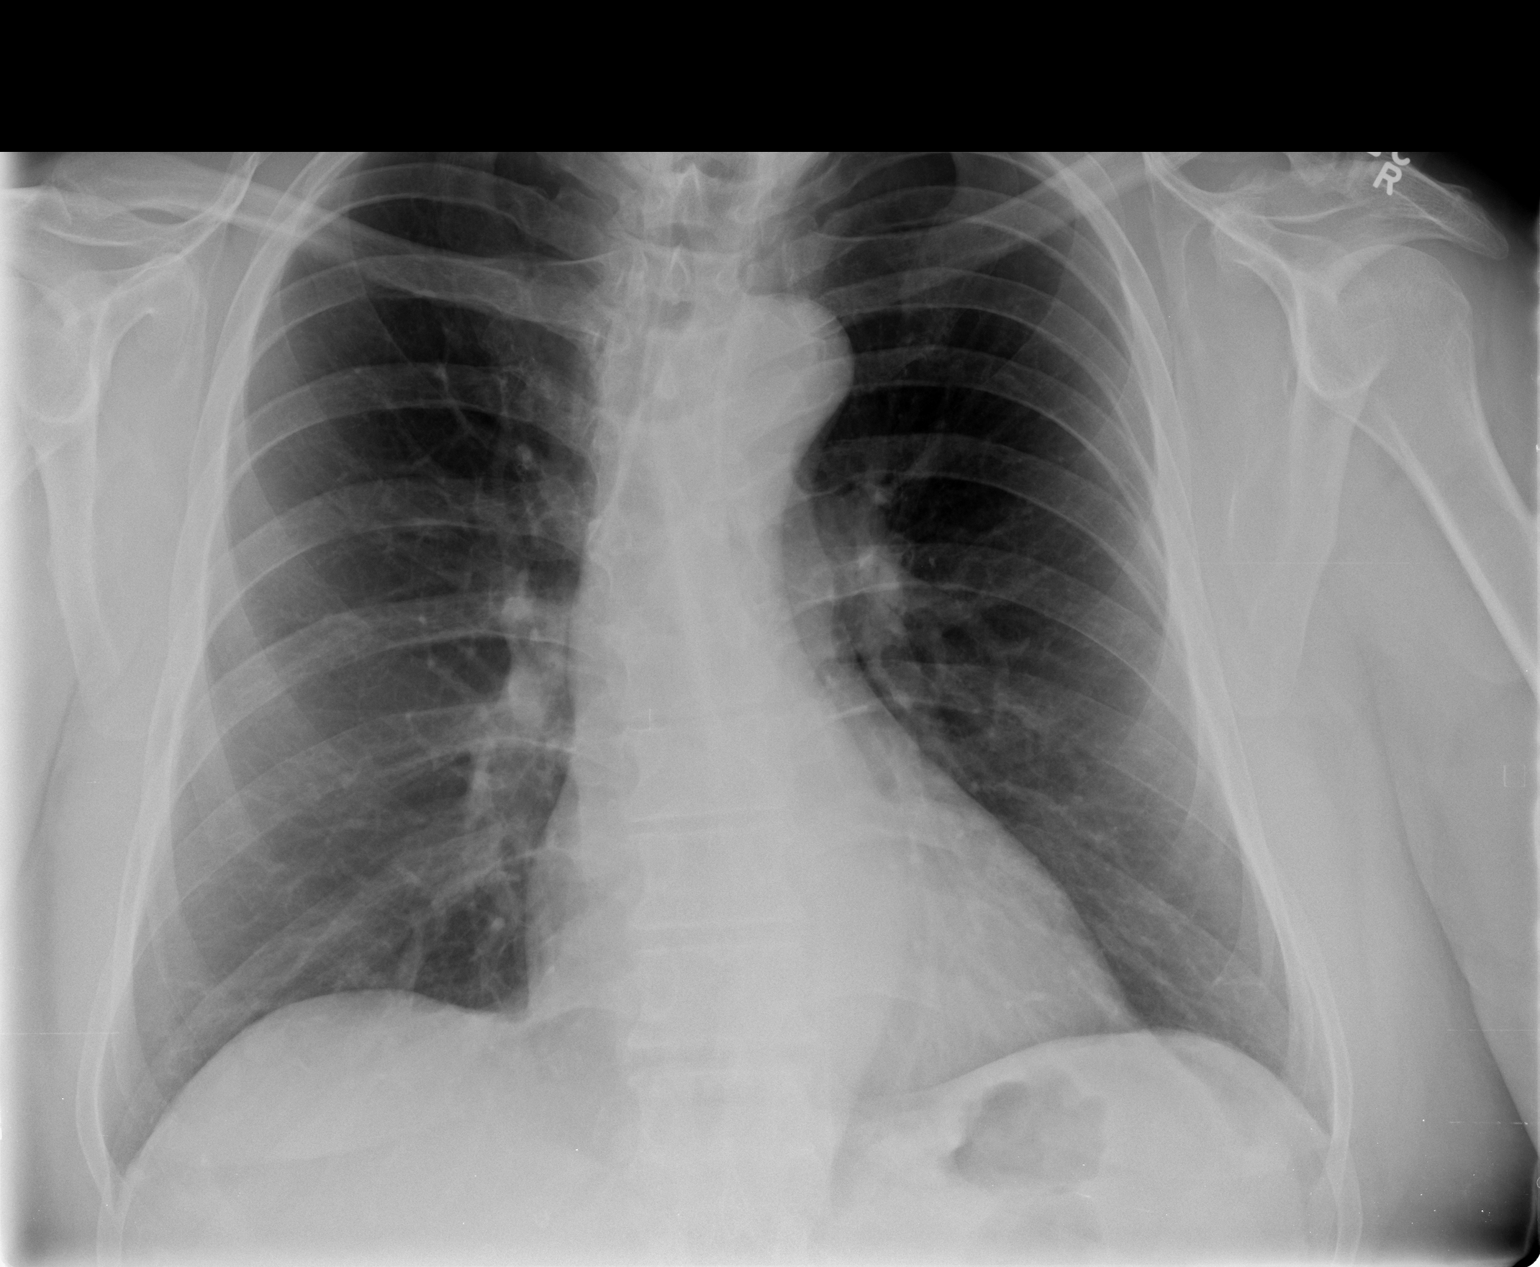

[view not recorded (2 of 2)]
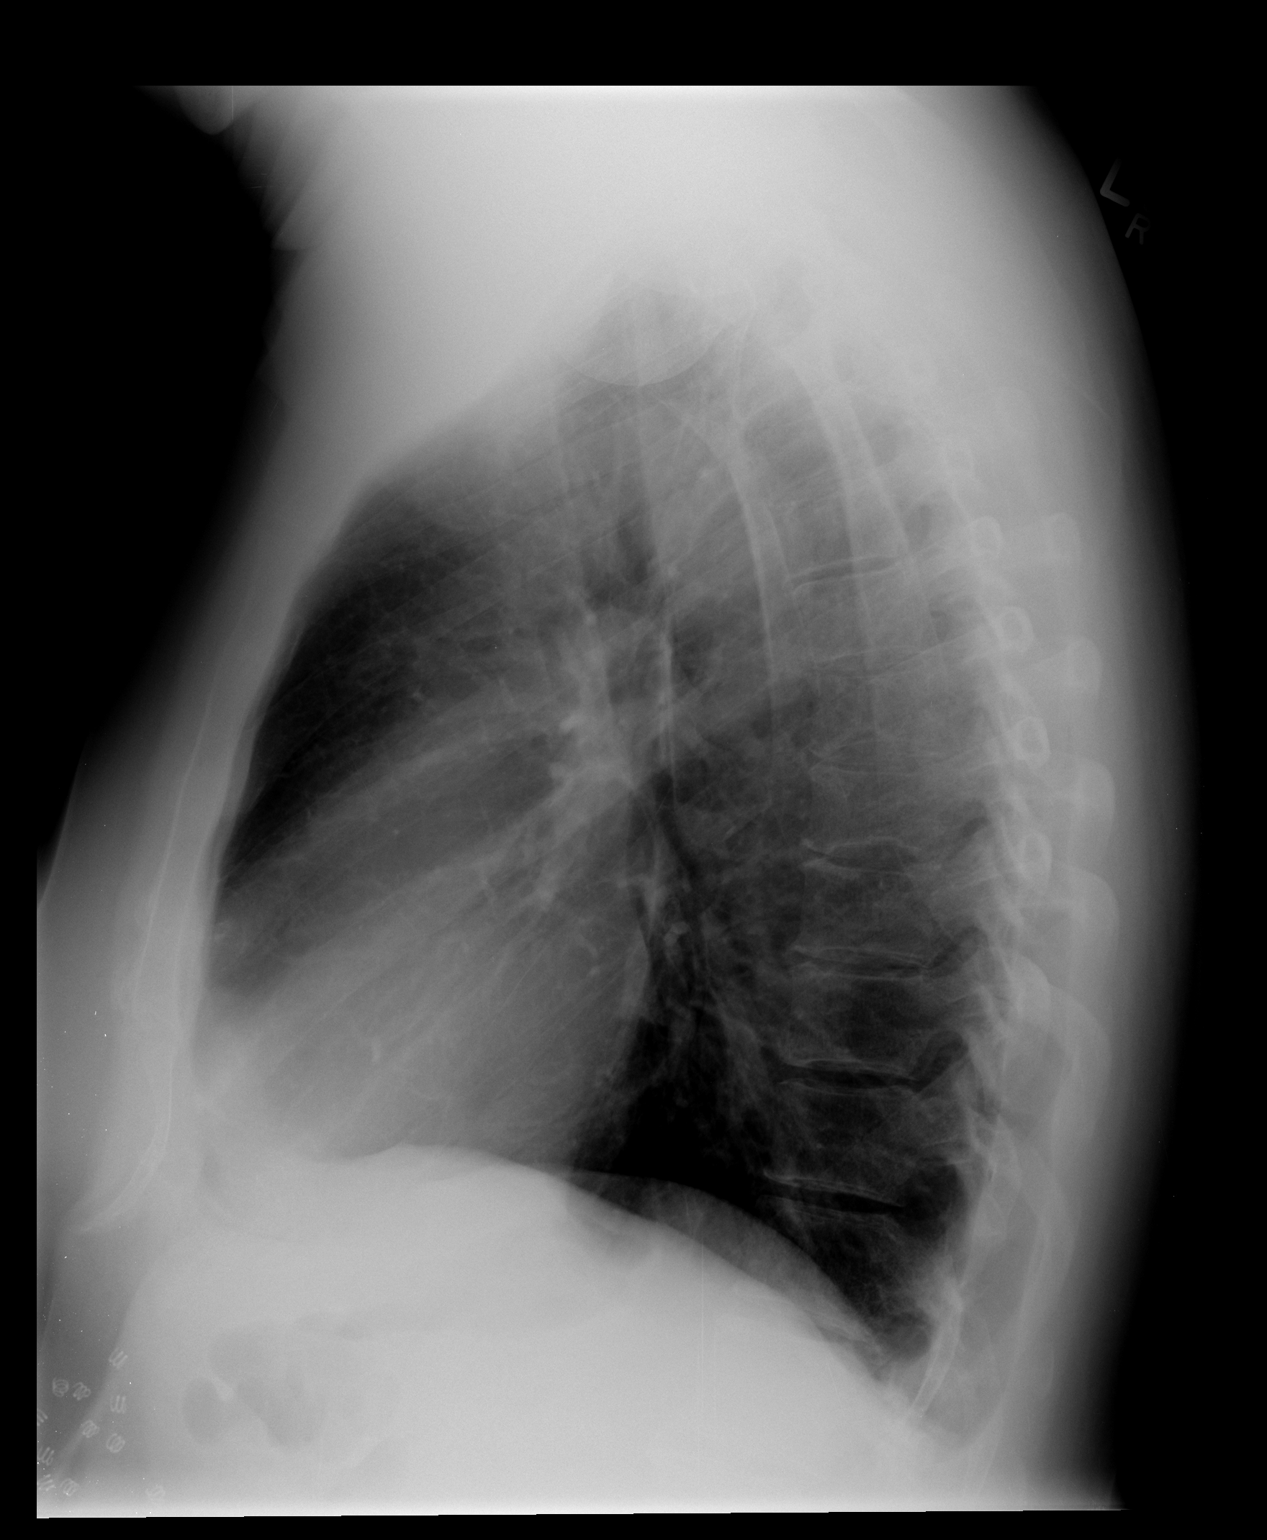

[2 of 2 positions shown; findings below may reference images not displayed]

FINDINGS: Lungs are clear.  Heart size is normal.  No pneumothorax
or pleural fluid. Mild scoliosis noted.
IMPRESSION: No acute disease.

## 2014-08-30 ENCOUNTER — Other Ambulatory Visit: Payer: Self-pay | Admitting: Cardiovascular Disease

## 2014-11-04 ENCOUNTER — Other Ambulatory Visit: Payer: Self-pay | Admitting: Cardiovascular Disease

## 2014-11-29 ENCOUNTER — Encounter (HOSPITAL_COMMUNITY): Payer: Self-pay

## 2014-11-29 ENCOUNTER — Other Ambulatory Visit (HOSPITAL_BASED_OUTPATIENT_CLINIC_OR_DEPARTMENT_OTHER): Payer: BC Managed Care – PPO

## 2014-11-29 ENCOUNTER — Ambulatory Visit (HOSPITAL_COMMUNITY)
Admission: RE | Admit: 2014-11-29 | Discharge: 2014-11-29 | Disposition: A | Discharge status: Home / Self Care | Payer: BC Managed Care – PPO | Source: Ambulatory Visit | Attending: Oncology | Admitting: Oncology

## 2014-11-29 DIAGNOSIS — K439 Ventral hernia without obstruction or gangrene: Secondary | ICD-10-CM | POA: Diagnosis not present

## 2014-11-29 DIAGNOSIS — C8590 Non-Hodgkin lymphoma, unspecified, unspecified site: Secondary | ICD-10-CM | POA: Diagnosis not present

## 2014-11-29 DIAGNOSIS — C649 Malignant neoplasm of unspecified kidney, except renal pelvis: Secondary | ICD-10-CM

## 2014-11-29 DIAGNOSIS — R972 Elevated prostate specific antigen [PSA]: Secondary | ICD-10-CM

## 2014-11-29 DIAGNOSIS — Z85528 Personal history of other malignant neoplasm of kidney: Secondary | ICD-10-CM | POA: Diagnosis present

## 2014-11-29 DIAGNOSIS — I7090 Unspecified atherosclerosis: Secondary | ICD-10-CM | POA: Diagnosis not present

## 2014-11-29 DIAGNOSIS — C7900 Secondary malignant neoplasm of unspecified kidney and renal pelvis: Secondary | ICD-10-CM

## 2014-11-29 DIAGNOSIS — Z905 Acquired absence of kidney: Secondary | ICD-10-CM | POA: Diagnosis present

## 2014-11-29 LAB — COMPREHENSIVE METABOLIC PANEL (CC13)
ALK PHOS: 76 U/L (ref 40–150)
ALT: 22 U/L (ref 0–55)
AST: 24 U/L (ref 5–34)
Albumin: 4 g/dL (ref 3.5–5.0)
Anion Gap: 9 mEq/L (ref 3–11)
BUN: 15.2 mg/dL (ref 7.0–26.0)
CALCIUM: 9.2 mg/dL (ref 8.4–10.4)
CHLORIDE: 105 meq/L (ref 98–109)
CO2: 26 mEq/L (ref 22–29)
CREATININE: 1 mg/dL (ref 0.7–1.3)
Glucose: 103 mg/dl (ref 70–140)
POTASSIUM: 4.5 meq/L (ref 3.5–5.1)
Sodium: 140 mEq/L (ref 136–145)
Total Bilirubin: 0.48 mg/dL (ref 0.20–1.20)
Total Protein: 6.3 g/dL — ABNORMAL LOW (ref 6.4–8.3)

## 2014-11-29 LAB — CBC WITH DIFFERENTIAL/PLATELET
BASO%: 0.6 % (ref 0.0–2.0)
Basophils Absolute: 0 10*3/uL (ref 0.0–0.1)
EOS%: 2.9 % (ref 0.0–7.0)
Eosinophils Absolute: 0.2 10*3/uL (ref 0.0–0.5)
HCT: 46.2 % (ref 38.4–49.9)
HGB: 15.4 g/dL (ref 13.0–17.1)
LYMPH#: 1 10*3/uL (ref 0.9–3.3)
LYMPH%: 15 % (ref 14.0–49.0)
MCH: 29.7 pg (ref 27.2–33.4)
MCHC: 33.3 g/dL (ref 32.0–36.0)
MCV: 89.3 fL (ref 79.3–98.0)
MONO#: 0.3 10*3/uL (ref 0.1–0.9)
MONO%: 5.2 % (ref 0.0–14.0)
NEUT#: 5.2 10*3/uL (ref 1.5–6.5)
NEUT%: 76.3 % — AB (ref 39.0–75.0)
Platelets: 158 10*3/uL (ref 140–400)
RBC: 5.18 10*6/uL (ref 4.20–5.82)
RDW: 13.2 % (ref 11.0–14.6)
WBC: 6.7 10*3/uL (ref 4.0–10.3)

## 2014-11-29 LAB — SEDIMENTATION RATE: SED RATE: 2 mm/h (ref 0–16)

## 2014-11-29 LAB — LACTATE DEHYDROGENASE (CC13): LDH: 202 U/L (ref 125–245)

## 2014-12-01 ENCOUNTER — Telehealth: Payer: Self-pay | Admitting: *Deleted

## 2014-12-01 NOTE — Telephone Encounter (Signed)
He may have his results now

## 2014-12-01 NOTE — Telephone Encounter (Signed)
Former pt of Dr. Beryle Beams recently had CT scan and labs done.  He asks for results.  He sees Dr. Alvy Bimler next week but hopes he can get results sooner?

## 2014-12-02 ENCOUNTER — Telehealth: Payer: Self-pay | Admitting: *Deleted

## 2014-12-02 NOTE — Telephone Encounter (Signed)
Left VM for pt informing him ok for RN to give him results of tests per Dr. Alvy Bimler.  Please call nurse back for results.

## 2014-12-02 NOTE — Telephone Encounter (Signed)
Informed pt of CT scan no evidence of cancer and labs all wnl.  He verbalized understanding and confirmed his appt for Monday morning at 9:30 am.

## 2014-12-06 ENCOUNTER — Encounter: Payer: Self-pay | Admitting: Hematology and Oncology

## 2014-12-06 ENCOUNTER — Telehealth: Payer: Self-pay | Admitting: Hematology and Oncology

## 2014-12-06 ENCOUNTER — Ambulatory Visit (HOSPITAL_BASED_OUTPATIENT_CLINIC_OR_DEPARTMENT_OTHER): Payer: BC Managed Care – PPO

## 2014-12-06 ENCOUNTER — Ambulatory Visit (HOSPITAL_BASED_OUTPATIENT_CLINIC_OR_DEPARTMENT_OTHER): Payer: BC Managed Care – PPO | Admitting: Hematology and Oncology

## 2014-12-06 DIAGNOSIS — C7901 Secondary malignant neoplasm of right kidney and renal pelvis: Secondary | ICD-10-CM

## 2014-12-06 DIAGNOSIS — Z299 Encounter for prophylactic measures, unspecified: Secondary | ICD-10-CM

## 2014-12-06 DIAGNOSIS — C641 Malignant neoplasm of right kidney, except renal pelvis: Secondary | ICD-10-CM

## 2014-12-06 DIAGNOSIS — Z23 Encounter for immunization: Secondary | ICD-10-CM

## 2014-12-06 DIAGNOSIS — Z85528 Personal history of other malignant neoplasm of kidney: Secondary | ICD-10-CM

## 2014-12-06 DIAGNOSIS — Z8572 Personal history of non-Hodgkin lymphomas: Secondary | ICD-10-CM | POA: Insufficient documentation

## 2014-12-06 DIAGNOSIS — C8590 Non-Hodgkin lymphoma, unspecified, unspecified site: Secondary | ICD-10-CM

## 2014-12-06 HISTORY — DX: Non-Hodgkin lymphoma, unspecified, unspecified site: C85.90

## 2014-12-06 MED ORDER — PNEUMOCOCCAL 13-VAL CONJ VACC IM SUSP
0.5000 mL | Freq: Once | INTRAMUSCULAR | Status: AC
  Administered 2014-12-06: 0.5 mL via INTRAMUSCULAR
  Filled 2014-12-06: qty 0.5

## 2014-12-06 MED ORDER — INFLUENZA VAC SPLIT QUAD 0.5 ML IM SUSY
0.5000 mL | PREFILLED_SYRINGE | Freq: Once | INTRAMUSCULAR | Status: AC
  Administered 2014-12-06: 0.5 mL via INTRAMUSCULAR
  Filled 2014-12-06: qty 0.5

## 2014-12-06 NOTE — Telephone Encounter (Signed)
gv adn printeda ppt sched and avs for pt for Dec 2016

## 2014-12-06 NOTE — Assessment & Plan Note (Signed)
Clinically, he has no signs of disease recurrence. I recommend history, physical examination and blood work in one year. I would defer imaging study to the future to be done only if we suspect disease recurrence. The patient is educated about signs and symptoms to watch out for disease recurrence.

## 2014-12-06 NOTE — Assessment & Plan Note (Signed)
We discussed the importance of preventive care and reviewed the vaccination programs. He does not have any prior allergic reactions to influenza and pneumococcal vaccinations and he is due for  . He agrees to proceed with influenza and pneumococcal vaccinations today and we will administer them today at the clinic.

## 2014-12-06 NOTE — Progress Notes (Signed)
[place] progress notes  Patient Care Team: Barnabas Lister, MD as PCP - General (Family Medicine) Rexene Edison, MD as Consulting Physician (Radiation Oncology) Claybon Jabs, MD as Consulting Physician (Urology) Heath Lark, MD as Consulting Physician (Hematology and Oncology)  CHIEF COMPLAINTS/PURPOSE OF VISIT:  History of lymphoma and kidney cancer  HISTORY OF PRESENTING ILLNESS:  Brian Parker 64 y.o. male was transferred to my care after his prior physician has left.  I reviewed the patient's records extensive and collaborated the history with the patient. Summary of his history is as follows: He was initially diagnosed with a follicular grade 1,  B-cell, non-Hodgkin's lymphoma in 2003 . He has also been diagnosed with metachronous primary tumors of his right kidney and ultimately underwent initial partial nephrectomy in March 2004 then a completion nephrectomy in January 2011. With regard to his lymphoma, he was treated with 8 cycles of CHOP/Rituxan through July 2003. He was started on maintenance rituximab but only had 2 cycles due to development of delayed neutropenia.  There was suspicion for early progression on a CT scan done in July of 2009 which showed a single area of soft tissue density in the left periaortic region. This area remained stable on serial scans until a study done on 11/13/2011 which did show clear regrowth in this area enlarging from 2.6 x 0.9 cm to approximately 4.9 x 3.4 cm. A PET scan was done on 11/30/2011 which showed a single area of markedly abnormal activity with SUV up to 10.3 over the abnormal area of lymphadenopathy seen on CT scan. There were no other areas of activity.  Given this fact, he was referred to radiation oncologist, seen by Dr. Arloa Koh and received 3000 cGy in 15 fractions to the left para-aortic lymph node mass between January 16 and 02/04/2012. He tolerated treatment well.  A post treatment CT scan done  04/29/2012 which I personally reviewed showed a dramatic reduction in the lymph node mass with minimal residual soft tissue density. Subsequent followup studies shown a further reduction down to almost imperceptible amount of soft tissue in the periaortic area.   He has had no interim infections. He continues to work full-time. He denies new lymphadenopathy. Denies new hematuria. He follows closely with urologist for mildly elevated PSA  MEDICAL HISTORY:  Past Medical History  Diagnosis Date  . Non Hodgkin's lymphoma   . Hypertension   . Edema of lower extremity   . Follicular lymphoma grade I of intra-abdominal lymph nodes 12/31/2006  . Elevated prostate specific antigen (PSA) 11/20/2011  . Nodular lymphoma involving intra-abdominal lymph nodes 12/20/2011  . Chest pain     INTERMITTENT/15 years ago//stress induced  . Kidney carcinoma 03/01/2003    tumor seperate from lymphoma; found during routine screening for lymphoma  . Cancer of kidney, secondary 01/06/2010    right kidney//right kidney removed//left kidney functions WDL  . NHL (non-Hodgkin's lymphoma) 12/06/2014    SURGICAL HISTORY: Past Surgical History  Procedure Laterality Date  . Nephrectomy      right kidney  . Appendectomy    . Shoulder surgery    . US echocardiography  02/26/2006    EF 55-60%  . Cardiovascular stress test  10/19/2008    EF 52%, NO ISCHEMIA  . Groin mass open biopsy    . Ventral hernia repair  2003-2004    with mesh after biopsy  . Hernia repair  2006-2007    right laparoscopic  . Hernia repair  2006-2007    left laparoscopic  . Total knee arthroplasty Left 03/25/2013    Dr Marlou Sa  . Total knee arthroplasty Left 03/24/2013    Procedure: LEFT TOTAL KNEE ARTHROPLASTY;  Surgeon: Meredith Pel, MD;  Location: [facility];  Service: Orthopedics;  Laterality: Left;  Left Total Knee Arthroplasty    SOCIAL HISTORY: History   Social History  . Marital Status: Married    Spouse Name: N/A    Number of  Children: N/A  . Years of Education: N/A   Occupational History  . Not on file.   Social History Main Topics  . Smoking status: Never Smoker   . Smokeless tobacco: Never Used  . Alcohol Use: 4.2 oz/week    6 Glasses of wine, 1 Cans of beer per week     Comment: social  . Drug Use: No  . Sexual Activity: Not on file   Other Topics Concern  . Not on file   Social History Narrative    FAMILY HISTORY: Family History  Problem Relation Age of Onset  . Uterine cancer Mother   . Cancer Mother     cervical  . Coronary artery disease Father   . Hypertension Father     ALLERGIES:  is allergic to losartan.  MEDICATIONS:  Current Outpatient Prescriptions  Medication Sig Dispense Refill  . amLODipine (NORVASC) 5 MG tablet TAKE 1 TABLET BY MOUTH DAILY 30 tablet 6  . aspirin EC 81 MG tablet Take 81 mg by mouth daily.      [name] doxazosin (CARDURA) 4 MG tablet TAKE 1/2 TABLET BY MOUTH EVERY NIGHT AT BEDTIME 30 tablet 5  . Fish Oil-Krill Oil CAPS Take 1 capsule by mouth daily.    . hydrochlorothiazide (HYDRODIURIL) 25 MG tablet TAKE 1 TABLET BY MOUTH EVERY DAY 30 tablet 0  . lisinopril (PRINIVIL,ZESTRIL) 10 MG tablet Take 1 tablet (10 mg total) by mouth daily. 90 tablet 3  . Multiple Vitamin (MULTIVITAMIN) tablet Take 1 tablet by mouth daily.      . potassium chloride (K-DUR) 10 MEQ tablet Take 1 tablet (10 mEq total) by mouth daily. 30 tablet 5   No current facility-administered medications for this visit.    REVIEW OF SYSTEMS:   Constitutional: Denies fevers, chills or abnormal night sweats Eyes: Denies blurriness of vision, double vision or watery eyes Ears, nose, mouth, throat, and face: Denies mucositis or sore throat Respiratory: Denies cough, dyspnea or wheezes Cardiovascular: Denies palpitation, chest discomfort or lower extremity swelling Gastrointestinal:  Denies nausea, heartburn or change in bowel habits Skin: Denies abnormal skin rashes Lymphatics: Denies new  lymphadenopathy or easy bruising Neurological:Denies numbness, tingling or new weaknesses Behavioral/Psych: Mood is stable, no new changes  All other systems were reviewed with the patient and are negative.  PHYSICAL EXAMINATION: ECOG PERFORMANCE STATUS: 0 - Asymptomatic GENERAL:alert, no distress and comfortable SKIN: skin color, texture, turgor are normal, no rashes or significant lesions EYES: normal, conjunctiva are pink and non-injected, sclera clear OROPHARYNX:no exudate, normal lips, buccal mucosa, and tongue  NECK: supple, thyroid normal size, non-tender, without nodularity LYMPH:  no palpable lymphadenopathy in the cervical, axillary or inguinal LUNGS: clear to auscultation and percussion with normal breathing effort HEART: regular rate & rhythm and no murmurs without lower extremity edema ABDOMEN:abdomen soft, non-tender and normal bowel sounds. Well-healed surgical scars Musculoskeletal:no cyanosis of digits and no clubbing  PSYCH: alert & oriented x 3 with fluent speech NEURO: no focal motor/sensory deficits  LABORATORY DATA:  I have reviewed  the data as listed Lab Results  Component Value Date   WBC 6.7 11/29/2014   HGB 15.4 11/29/2014   HCT 46.2 11/29/2014   MCV 89.3 11/29/2014   PLT 158 11/29/2014    Recent Labs  03/02/14 0817 05/19/14 0957 11/29/14 0852  NA 141 141 140  K 4.0 4.0 4.5  CL  --  104  --   CO2 27 31 26   GLUCOSE 101 69* 103  BUN 14.6 16 15.2  CREATININE 1.0 1.1 1.0  CALCIUM 9.4 9.3 9.2  PROT 6.3* 6.2 6.3*  ALBUMIN 4.0 4.2 4.0  AST 19 21 24   ALT 18 19 22   ALKPHOS 57 51 76  BILITOT 0.49 0.5 0.48  BILIDIR  --  0.1  --     RADIOGRAPHIC STUDIES: I have personally reviewed the radiological images as listed and agreed with the findings in the report. Ct Abdomen Wo Contrast  11/29/2014   CLINICAL DATA:  Non Hodgkin lymphoma, right nephrectomy, history of renal cell carcinoma.  EXAM: CT CHEST, ABDOMEN AND PELVIS WITHOUT CONTRAST  TECHNIQUE:  Multidetector CT imaging of the chest, abdomen and pelvis was performed following the standard protocol without IV contrast.  COMPARISON:  CT abdomen pelvis 03/02/2014 and CT chest 03/02/2013.  FINDINGS: CT CHEST FINDINGS  No pathologically enlarged mediastinal or axillary lymph nodes. Hilar regions are difficult to definitively evaluate without IV contrast. Heart size normal. No pericardial effusion.  Lungs are clear.  No pleural fluid.  Airway is unremarkable.  CT ABDOMEN FINDINGS  Hepatobiliary: 8 mm low-attenuation lesion in the right hepatic lobe is unchanged. Liver and gallbladder are otherwise unremarkable. No biliary ductal dilatation.  Pancreas: Negative.  Spleen: Negative.  Adrenals/Urinary Tract: Adrenal glands are unremarkable. Right nephrectomy. No abnormal soft tissue in the surgical bed. Left kidney is unremarkable. Visualized portion of the left ureter is decompressed.  Stomach/Bowel: Stomach and visualized portions of the small bowel and colon are unremarkable.  Vascular/Lymphatic: Minimal atherosclerotic calcification of the arterial vasculature without abdominal aortic aneurysm. There is mild haziness in the retroperitoneal fat, without pathologically enlarged lymph nodes. Similarly, mild haziness and sub cm short axis nodularity are seen in the small bowel mesentery, stable.  Other: Ventral hernia repair with laxity of the midline ventral abdominal wall or recurrent hernia, stable.  Musculoskeletal: No worrisome lytic or sclerotic lesions. Mild degenerative changes are seen in the spine.  IMPRESSION: 1. No evidence of recurrent lymphoma. 2. Right nephrectomy without evidence of metastatic disease. 3. Ventral hernia repair with laxity of the midline ventral abdominal wall or recurrent hernia, stable.   Electronically Signed   By: Lorin Picket M.D.   On: 11/29/2014 09:43   Ct Chest Wo Contrast  11/29/2014   CLINICAL DATA:  Non Hodgkin lymphoma, right nephrectomy, history of renal cell  carcinoma.  EXAM: CT CHEST, ABDOMEN AND PELVIS WITHOUT CONTRAST  TECHNIQUE: Multidetector CT imaging of the chest, abdomen and pelvis was performed following the standard protocol without IV contrast.  COMPARISON:  CT abdomen pelvis 03/02/2014 and CT chest 03/02/2013.  FINDINGS: CT CHEST FINDINGS  No pathologically enlarged mediastinal or axillary lymph nodes. Hilar regions are difficult to definitively evaluate without IV contrast. Heart size normal. No pericardial effusion.  Lungs are clear.  No pleural fluid.  Airway is unremarkable.  CT ABDOMEN FINDINGS  Hepatobiliary: 8 mm low-attenuation lesion in the right hepatic lobe is unchanged. Liver and gallbladder are otherwise unremarkable. No biliary ductal dilatation.  Pancreas: Negative.  Spleen: Negative.  Adrenals/Urinary Tract: Adrenal glands  are unremarkable. Right nephrectomy. No abnormal soft tissue in the surgical bed. Left kidney is unremarkable. Visualized portion of the left ureter is decompressed.  Stomach/Bowel: Stomach and visualized portions of the small bowel and colon are unremarkable.  Vascular/Lymphatic: Minimal atherosclerotic calcification of the arterial vasculature without abdominal aortic aneurysm. There is mild haziness in the retroperitoneal fat, without pathologically enlarged lymph nodes. Similarly, mild haziness and sub cm short axis nodularity are seen in the small bowel mesentery, stable.  Other: Ventral hernia repair with laxity of the midline ventral abdominal wall or recurrent hernia, stable.  Musculoskeletal: No worrisome lytic or sclerotic lesions. Mild degenerative changes are seen in the spine.  IMPRESSION: 1. No evidence of recurrent lymphoma. 2. Right nephrectomy without evidence of metastatic disease. 3. Ventral hernia repair with laxity of the midline ventral abdominal wall or recurrent hernia, stable.   Electronically Signed   By: Lorin Picket M.D.   On: 11/29/2014 09:43    ASSESSMENT & PLAN:  NHL (non-Hodgkin's  lymphoma) Clinically, he has no signs of disease recurrence. I recommend history, physical examination and blood work in one year. I would defer imaging study to the future to be done only if we suspect disease recurrence. The patient is educated about signs and symptoms to watch out for disease recurrence.  Kidney carcinoma He had her early-stage cancer which was cured by nephrectomy. He has no signs of recurrence. He will continue close follow-up with a urologist  Preventive measure We discussed the importance of preventive care and reviewed the vaccination programs. He does not have any prior allergic reactions to influenza and pneumococcal vaccinations and he is due for  . He agrees to proceed with influenza and pneumococcal vaccinations today and we will administer them today at the clinic.    Orders Placed This Encounter  Procedures  . CBC with Differential    Standing Status: Future     Number of Occurrences:      Standing Expiration Date: 01/10/2016  . Comprehensive metabolic panel    Standing Status: Future     Number of Occurrences:      Standing Expiration Date: 01/10/2016  . Lactate dehydrogenase    Standing Status: Future     Number of Occurrences:      Standing Expiration Date: 01/10/2016    All questions were answered. The patient knows to call the clinic with any problems, questions or concerns. I spent 30 minutes counseling the patient face to face. The total time spent in the appointment was 40 minutes and more than 50% was on counseling.     [facility], Tajon Moring, MD 12/06/2014 8:23 PM

## 2014-12-06 NOTE — Assessment & Plan Note (Signed)
He had her early-stage cancer which was cured by nephrectomy. He has no signs of recurrence. He will continue close follow-up with a urologist

## 2014-12-10 ENCOUNTER — Other Ambulatory Visit: Payer: Self-pay | Admitting: Cardiovascular Disease

## 2015-04-12 ENCOUNTER — Other Ambulatory Visit: Payer: Self-pay | Admitting: Cardiovascular Disease

## 2015-04-12 ENCOUNTER — Other Ambulatory Visit: Payer: Self-pay | Admitting: Interventional Cardiology

## 2015-04-13 ENCOUNTER — Other Ambulatory Visit: Payer: Self-pay

## 2015-06-24 ENCOUNTER — Other Ambulatory Visit: Payer: Self-pay

## 2015-06-24 MED ORDER — AMLODIPINE BESYLATE 5 MG PO TABS: 5.0000 mg | ORAL_TABLET | Freq: Every day | ORAL | Status: DC

## 2015-06-27 ENCOUNTER — Encounter: Payer: Self-pay | Admitting: *Deleted

## 2015-06-27 ENCOUNTER — Ambulatory Visit (INDEPENDENT_AMBULATORY_CARE_PROVIDER_SITE_OTHER): Payer: BLUE CROSS/BLUE SHIELD | Admitting: Cardiovascular Disease

## 2015-06-27 VITALS — BP 140/90 | HR 58 | Ht 70.0 in | Wt 226.0 lb

## 2015-06-27 DIAGNOSIS — E785 Hyperlipidemia, unspecified: Secondary | ICD-10-CM

## 2015-06-27 DIAGNOSIS — I10 Essential (primary) hypertension: Secondary | ICD-10-CM

## 2015-06-27 LAB — LIPID PANEL
CHOLESTEROL: 157 mg/dL (ref 0–200)
HDL: 47.7 mg/dL (ref >39.00)
LDL Cholesterol: 98 mg/dL (ref 0–99)
NonHDL: 109.3
TRIGLYCERIDES: 57 mg/dL (ref 0.0–149.0)
Total CHOL/HDL Ratio: 3
VLDL: 11.4 mg/dL (ref 0.0–40.0)

## 2015-06-27 LAB — HEPATIC FUNCTION PANEL
ALT: 16 U/L (ref 0–53)
AST: 19 U/L (ref 0–37)
Albumin: 4.1 g/dL (ref 3.5–5.2)
Alkaline Phosphatase: 58 U/L (ref 39–117)
Bilirubin, Direct: 0.1 mg/dL (ref 0.0–0.3)
Total Bilirubin: 0.5 mg/dL (ref 0.2–1.2)
Total Protein: 6.4 g/dL (ref 6.0–8.3)

## 2015-06-27 NOTE — Progress Notes (Signed)
Brian Parker Date of Birth  01-22-1950 [place] [address]    [place]   [place], [place]  [address]    [place], [place]  [address] [phone]  Fax  [phone]  [phone]  Fax [phone]  1. Hypertension 2. Non Hodgkins Lymphoma  History of Present Illness:  Brian Parker has done well from a cardiac standpoint.  He recently had XRT for a growing lymph node in his abdomen.   His BP has been well controlled at home.    January 29, 2013: He continues to have issues with his non-hodgkins lymphoma.  He had XTR and has been declared in remission .  He had a  Left leg puncture would from a drill bit - never really healed well.  He recently had this would debrided and is scheduled to have a left knee replacement once this has healed.  He has not been able to exercise because of these leg issues.   Nov. 13, 2014: Brian Parker is doing well. His BP has been well controlled.  His lymphoma is still in remission.  Playing golf regularly.   May 19, 2014: Brian Parker is doing well.  Playing some golf.   BP has been well controlled.    He has been playing golf in the evenings ( walks 9 holes).   He is slowing losing wome weight.    June 27, 2015:   Brian Parker is doing well.  No CP .  Playing golf regularly .  Has been tracking what he eats .  Has been measuring his BP  several times a day   Current Outpatient Prescriptions on File Prior to Visit  Medication Sig Dispense Refill  . amLODipine (NORVASC) 5 MG tablet Take 1 tablet (5 mg total) by mouth daily. 30 tablet 5  . aspirin EC 81 MG tablet Take 81 mg by mouth daily.      [name] doxazosin (CARDURA) 4 MG tablet TAKE 1/2 TABLET BY MOUTH EVERY NIGHT AT BEDTIME 30 tablet 5  . Fish Oil-Krill Oil CAPS Take 1 capsule by mouth daily.    . hydrochlorothiazide (HYDRODIURIL) 25 MG tablet TAKE 1 TABLET BY MOUTH DAILY 30 tablet 6  . lisinopril (PRINIVIL,ZESTRIL) 10 MG tablet Take 1 tablet (10 mg total) by mouth  daily. 90 tablet 3  . Multiple Vitamin (MULTIVITAMIN) tablet Take 1 tablet by mouth daily.      . potassium chloride (K-DUR,KLOR-CON) 10 MEQ tablet TAKE 1 TABLET BY MOUTH EVERY DAY 30 tablet 1   No current facility-administered medications on file prior to visit.  Amlodipine 5 mg a day   Allergies  Allergen Reactions  . Losartan     Stomach cramps.    Past Medical History  Diagnosis Date  . Non Hodgkin's lymphoma   . Hypertension   . Edema of lower extremity   . Follicular lymphoma grade I of intra-abdominal lymph nodes 12/31/2006  . Elevated prostate specific antigen (PSA) 11/20/2011  . Nodular lymphoma involving intra-abdominal lymph nodes 12/20/2011  . Chest pain     INTERMITTENT/15 years ago//stress induced  . Kidney carcinoma 03/01/2003    tumor seperate from lymphoma; found during routine screening for lymphoma  . Cancer of kidney, secondary 01/06/2010    right kidney//right kidney removed//left kidney functions WDL  . NHL (non-Hodgkin's lymphoma) 12/06/2014    Past Surgical History  Procedure Laterality Date  . Nephrectomy      right  kidney  . Appendectomy    . Shoulder surgery    . US echocardiography  02/26/2006    EF 55-60%  . Cardiovascular stress test  10/19/2008    EF 52%, NO ISCHEMIA  . Groin mass open biopsy    . Ventral hernia repair  2003-2004    with mesh after biopsy  . Hernia repair  2006-2007    right laparoscopic  . Hernia repair  2006-2007    left laparoscopic  . Total knee arthroplasty Left 03/25/2013    Dr Marlou Sa  . Total knee arthroplasty Left 03/24/2013    Procedure: LEFT TOTAL KNEE ARTHROPLASTY;  Surgeon: Meredith Pel, MD;  Location: [facility];  Service: Orthopedics;  Laterality: Left;  Left Total Knee Arthroplasty    History  Smoking status  . Never Smoker   Smokeless tobacco  . Never Used    History  Alcohol Use  . 4.2 oz/week  . 6 Glasses of wine, 1 Cans of beer per week    Comment: social    Family History  Problem Relation  Age of Onset  . Uterine cancer Mother   . Cervical cancer Mother   . Coronary artery disease Father   . Hypertension Father     Reviw of Systems:  Reviewed in the HPI.  All other systems are negative.  Physical Exam: Blood pressure 140/90, pulse 58, height 5\' 10"  (1.778 m), weight 102.513 kg (226 lb). General: Well developed, well nourished, in no acute distress.  Head: Normocephalic, atraumatic, sclera non-icteric, mucus membranes are moist,   Neck: Supple. Negative for carotid bruits. JVD not elevated.  Lungs: Clear bilaterally to auscultation without wheezes, rales, or rhonchi. Breathing is unlabored.  Heart: RRR with S1 S2. No murmurs, rubs, or gallops appreciated.  Abdomen: Soft, non-tender, non-distended with normoactive bowel sounds. No hepatomegaly. No rebound/guarding. No obvious abdominal masses.  Msk:  Strength and tone appear normal for age.  Extremities: No clubbing or cyanosis. No edema.  Distal pedal pulses are 2+ and equal bilaterally.  Neuro: Alert and oriented X 3. Moves all extremities spontaneously.  Psych:  Responds to questions appropriately with a normal affect.  ECG:   June 27, 2015:  Sinus brady at 55.  No ST or T wave changes   Assessment / Plan:   1. Hypertension:   BP is well controlled .  He has noticed that the BP is slightly lower in the right arm compared to the left arm.   No symptoms.  We verified that differentce in the offie today .  Right - 132/ 82 Left 140/90  At this point, he has no symptoms and I do not think he needs any further eval.  Would get a formal set of ABIs if he develops any symptoms . Continue current meds.  Will check lipids today  Will see him in 1 year   2. Non Hodgkins Lymphoma Followed by his oncologist      Izabelle Daus, Wonda Cheng, MD  06/27/2015 3:06 PM    [place],  [place], [place]  [address] Pager [phone] Phone: [phone]; Fax: [phone]    [facility]  [address] [place], [place]  [address] [phone]    Fax [phone]

## 2015-06-27 NOTE — Patient Instructions (Signed)
Medication Instructions:  Your physician recommends that you continue on your current medications as directed. Please refer to the Current Medication list given to you today.   Labwork: TODAY - Cholesterol, Liver, Basic metabolic panel   Testing/Procedures: None Ordered   Follow-Up: Your physician wants you to follow-up in: 1 year with Dr. Acie Fredrickson.  You will receive a reminder letter in the mail two months in advance. If you don't receive a letter, please call our office to schedule the follow-up appointment.

## 2015-07-06 ENCOUNTER — Ambulatory Visit: Payer: Self-pay | Admitting: Cardiovascular Disease

## 2015-09-07 ENCOUNTER — Other Ambulatory Visit: Payer: Self-pay

## 2015-09-07 DIAGNOSIS — Z79899 Other long term (current) drug therapy: Secondary | ICD-10-CM

## 2015-09-07 MED ORDER — LISINOPRIL 10 MG PO TABS: 10.0000 mg | ORAL_TABLET | Freq: Every day | ORAL | Status: DC

## 2015-09-07 NOTE — Telephone Encounter (Signed)
Ok to fill 

## 2015-11-15 ENCOUNTER — Other Ambulatory Visit: Payer: Self-pay | Admitting: *Deleted

## 2015-11-15 MED ORDER — AMLODIPINE BESYLATE 5 MG PO TABS: 5.0000 mg | ORAL_TABLET | Freq: Every day | ORAL | Status: DC

## 2015-11-22 ENCOUNTER — Other Ambulatory Visit: Payer: Self-pay

## 2015-11-22 MED ORDER — DOXAZOSIN MESYLATE 4 MG PO TABS: ORAL_TABLET | ORAL | Status: DC

## 2015-12-05 ENCOUNTER — Encounter: Payer: Self-pay | Admitting: Hematology and Oncology

## 2015-12-05 ENCOUNTER — Other Ambulatory Visit (HOSPITAL_BASED_OUTPATIENT_CLINIC_OR_DEPARTMENT_OTHER): Payer: BLUE CROSS/BLUE SHIELD

## 2015-12-05 ENCOUNTER — Telehealth: Payer: Self-pay | Admitting: Hematology and Oncology

## 2015-12-05 ENCOUNTER — Ambulatory Visit (HOSPITAL_BASED_OUTPATIENT_CLINIC_OR_DEPARTMENT_OTHER): Payer: BLUE CROSS/BLUE SHIELD | Admitting: Hematology and Oncology

## 2015-12-05 VITALS — BP 145/90 | HR 63 | Temp 98.1°F | Resp 20 | Ht 70.0 in | Wt 231.6 lb

## 2015-12-05 DIAGNOSIS — C8590 Non-Hodgkin lymphoma, unspecified, unspecified site: Secondary | ICD-10-CM

## 2015-12-05 DIAGNOSIS — Z8572 Personal history of non-Hodgkin lymphomas: Secondary | ICD-10-CM | POA: Diagnosis not present

## 2015-12-05 DIAGNOSIS — Z85528 Personal history of other malignant neoplasm of kidney: Secondary | ICD-10-CM

## 2015-12-05 DIAGNOSIS — Z23 Encounter for immunization: Secondary | ICD-10-CM | POA: Diagnosis not present

## 2015-12-05 DIAGNOSIS — Z85828 Personal history of other malignant neoplasm of skin: Secondary | ICD-10-CM | POA: Diagnosis not present

## 2015-12-05 DIAGNOSIS — Z299 Encounter for prophylactic measures, unspecified: Secondary | ICD-10-CM

## 2015-12-05 DIAGNOSIS — I10 Essential (primary) hypertension: Secondary | ICD-10-CM

## 2015-12-05 HISTORY — DX: Personal history of non-Hodgkin lymphomas: Z85.72

## 2015-12-05 LAB — CBC WITH DIFFERENTIAL/PLATELET
BASO%: 0.5 % (ref 0.0–2.0)
BASOS ABS: 0 10*3/uL (ref 0.0–0.1)
EOS%: 2.7 % (ref 0.0–7.0)
Eosinophils Absolute: 0.2 10*3/uL (ref 0.0–0.5)
HEMATOCRIT: 44.7 % (ref 38.4–49.9)
HGB: 15 g/dL (ref 13.0–17.1)
LYMPH%: 14 % (ref 14.0–49.0)
MCH: 29.7 pg (ref 27.2–33.4)
MCHC: 33.6 g/dL (ref 32.0–36.0)
MCV: 88.5 fL (ref 79.3–98.0)
MONO#: 0.3 10*3/uL (ref 0.1–0.9)
MONO%: 4.5 % (ref 0.0–14.0)
NEUT#: 4.7 10*3/uL (ref 1.5–6.5)
NEUT%: 78.3 % — AB (ref 39.0–75.0)
PLATELETS: 154 10*3/uL (ref 140–400)
RBC: 5.06 10*6/uL (ref 4.20–5.82)
RDW: 13.8 % (ref 11.0–14.6)
WBC: 6.1 10*3/uL (ref 4.0–10.3)
lymph#: 0.8 10*3/uL — ABNORMAL LOW (ref 0.9–3.3)

## 2015-12-05 LAB — COMPREHENSIVE METABOLIC PANEL
ALT: 22 U/L (ref 0–55)
ANION GAP: 8 meq/L (ref 3–11)
AST: 20 U/L (ref 5–34)
Albumin: 3.8 g/dL (ref 3.5–5.0)
Alkaline Phosphatase: 64 U/L (ref 40–150)
BUN: 16.8 mg/dL (ref 7.0–26.0)
CALCIUM: 8.9 mg/dL (ref 8.4–10.4)
CO2: 24 mEq/L (ref 22–29)
CREATININE: 1.1 mg/dL (ref 0.7–1.3)
Chloride: 109 mEq/L (ref 98–109)
EGFR: 69 mL/min/{1.73_m2} — ABNORMAL LOW (ref >90)
Glucose: 97 mg/dl (ref 70–140)
POTASSIUM: 3.9 meq/L (ref 3.5–5.1)
Sodium: 140 mEq/L (ref 136–145)
Total Bilirubin: 0.51 mg/dL (ref 0.20–1.20)
Total Protein: 6.1 g/dL — ABNORMAL LOW (ref 6.4–8.3)

## 2015-12-05 LAB — LACTATE DEHYDROGENASE: LDH: 183 U/L (ref 125–245)

## 2015-12-05 MED ORDER — INFLUENZA VAC SPLIT QUAD 0.5 ML IM SUSY
0.5000 mL | PREFILLED_SYRINGE | Freq: Once | INTRAMUSCULAR | Status: AC
  Administered 2015-12-05: 0.5 mL via INTRAMUSCULAR
  Filled 2015-12-05: qty 0.5

## 2015-12-05 NOTE — Assessment & Plan Note (Signed)
he will continue current medical management. I recommend close follow-up with his cardiologist for medication adjustment.

## 2015-12-05 NOTE — Assessment & Plan Note (Signed)
He has no suspicious moles today. I recommend minimum annual skin check by dermatologist 

## 2015-12-05 NOTE — Progress Notes (Signed)
[facility] OFFICE PROGRESS NOTE  Patient Care Team: Derinda Late, MD as PCP - General (Family Medicine) Arloa Koh, MD as Consulting Physician (Radiation Oncology) Kathie Rhodes, MD as Consulting Physician (Urology) Heath Lark, MD as Consulting Physician (Hematology and Oncology)  SUMMARY OF ONCOLOGIC HISTORY: I reviewed the patient's records extensive and collaborated the history with the patient. Summary of his history is as follows: He was initially diagnosed with a follicular grade 1,  B-cell, non-Hodgkin's lymphoma in 2003 . He has also been diagnosed with metachronous primary tumors of his right kidney and ultimately underwent initial partial nephrectomy in March 2004 then a completion nephrectomy in January 2011. With regard to his lymphoma, he was treated with 8 cycles of CHOP/Rituxan through July 2003. He was started on maintenance rituximab but only had 2 cycles due to development of delayed neutropenia.  There was suspicion for early progression on a CT scan done in July of 2009 which showed a single area of soft tissue density in the left periaortic region. This area remained stable on serial scans until a study done on 11/13/2011 which did show clear regrowth in this area enlarging from 2.6 x 0.9 cm to approximately 4.9 x 3.4 cm. A PET scan was done on 11/30/2011 which showed a single area of markedly abnormal activity with SUV up to 10.3 over the abnormal area of lymphadenopathy seen on CT scan. There were no other areas of activity.  Given this fact, he was referred to radiation oncologist, seen by Dr. Arloa Koh and received 3000 cGy in 15 fractions to the left para-aortic lymph node mass between January 16 and 02/04/2012. He tolerated treatment well.  A post treatment CT scan done 04/29/2012 which I personally reviewed showed a dramatic reduction in the lymph node mass with minimal residual soft tissue density. Subsequent followup studies shown a further  reduction down to almost imperceptible amount of soft tissue in the periaortic area.  He follows closely with urologist for mildly elevated PSA  INTERVAL HISTORY: Please see below for problem oriented charting. He feels well. No new lymphadenopathy. No new skin lesions. He has intermittent right flank discomfort from prior surgery but it does not bother him on a consistent basis. He does not remember when he lost had colonoscopy. He had not have influenza vaccination this year. Denies recent infection  REVIEW OF SYSTEMS:   Constitutional: Denies fevers, chills or abnormal weight loss Eyes: Denies blurriness of vision Ears, nose, mouth, throat, and face: Denies mucositis or sore throat Respiratory: Denies cough, dyspnea or wheezes Cardiovascular: Denies palpitation, chest discomfort or lower extremity swelling Gastrointestinal:  Denies nausea, heartburn or change in bowel habits Skin: Denies abnormal skin rashes Lymphatics: Denies new lymphadenopathy or easy bruising Neurological:Denies numbness, tingling or new weaknesses Behavioral/Psych: Mood is stable, no new changes  All other systems were reviewed with the patient and are negative.  I have reviewed the past medical history, past surgical history, social history and family history with the patient and they are unchanged from previous note.  ALLERGIES:  is allergic to losartan.  MEDICATIONS:  Current Outpatient Prescriptions  Medication Sig Dispense Refill  . amLODipine (NORVASC) 5 MG tablet Take 1 tablet (5 mg total) by mouth daily. 30 tablet 6  . aspirin EC 81 MG tablet Take 81 mg by mouth daily.      [name] doxazosin (CARDURA) 4 MG tablet TAKE 1/2 TABLET BY MOUTH EVERY NIGHT AT BEDTIME 30 tablet 5  . Fish Oil-Krill Oil CAPS Take  1 capsule by mouth daily.    . hydrochlorothiazide (HYDRODIURIL) 25 MG tablet TAKE 1 TABLET BY MOUTH DAILY 30 tablet 6  . lisinopril (PRINIVIL,ZESTRIL) 10 MG tablet Take 1 tablet (10 mg total) by mouth  daily. 90 tablet 3  . Multiple Vitamin (MULTIVITAMIN) tablet Take 1 tablet by mouth daily.      . potassium chloride (K-DUR,KLOR-CON) 10 MEQ tablet TAKE 1 TABLET BY MOUTH EVERY DAY 30 tablet 1   No current facility-administered medications for this visit.    PHYSICAL EXAMINATION: ECOG PERFORMANCE STATUS: 0 - Asymptomatic  Filed Vitals:   12/05/15 0941 12/05/15 0942  BP: 154/93 145/90  Pulse: 63   Temp: 98.1 F (36.7 C)   Resp: 20    Filed Weights   12/05/15 0941  Weight: 231 lb 9.6 oz (105.053 kg)    GENERAL:alert, no distress and comfortable SKIN: skin color, texture, turgor are normal, no rashes or significant lesions. He has multiple moles. No suspicious lesions.[name] EYES: normal, Conjunctiva are pink and non-injected, sclera clear OROPHARYNX:no exudate, no erythema and lips, buccal mucosa, and tongue normal  NECK: supple, thyroid normal size, non-tender, without nodularity LYMPH:  no palpable lymphadenopathy in the cervical, axillary or inguinal LUNGS: clear to auscultation and percussion with normal breathing effort HEART: regular rate & rhythm and no murmurs and no lower extremity edema ABDOMEN:abdomen soft, non-tender and normal bowel sounds. Well-healed surgical scar Musculoskeletal:no cyanosis of digits and no clubbing  NEURO: alert & oriented x 3 with fluent speech, no focal motor/sensory deficits  LABORATORY DATA:  I have reviewed the data as listed    Component Value Date/Time   NA 140 12/05/2015 0928   NA 141 05/19/2014 0957   NA 142 04/29/2012 0906   K 3.9 12/05/2015 0928   K 4.0 05/19/2014 0957   K 4.3 04/29/2012 0906   CL 104 05/19/2014 0957   CL 105 03/02/2013 0818   CL 99 04/29/2012 0906   CO2 24 12/05/2015 0928   CO2 31 05/19/2014 0957   CO2 28 04/29/2012 0906   GLUCOSE 97 12/05/2015 0928   GLUCOSE 69* 05/19/2014 0957   GLUCOSE 88 03/02/2013 0818   GLUCOSE 94 04/29/2012 0906   BUN 16.8 12/05/2015 0928   BUN 16 05/19/2014 0957   BUN 18  04/29/2012 0906   CREATININE 1.1 12/05/2015 0928   CREATININE 1.1 05/19/2014 0957   CREATININE 1.3* 04/29/2012 0906   CALCIUM 8.9 12/05/2015 0928   CALCIUM 9.3 05/19/2014 0957   CALCIUM 8.6 04/29/2012 0906   PROT 6.1* 12/05/2015 0928   PROT 6.4 06/27/2015 1521   PROT 6.4 04/29/2012 0906   ALBUMIN 3.8 12/05/2015 0928   ALBUMIN 4.1 06/27/2015 1521   ALBUMIN 3.8 04/29/2012 0906   AST 20 12/05/2015 0928   AST 19 06/27/2015 1521   AST 23 04/29/2012 0906   ALT 22 12/05/2015 0928   ALT 16 06/27/2015 1521   ALT 20 04/29/2012 0906   ALKPHOS 64 12/05/2015 0928   ALKPHOS 58 06/27/2015 1521   ALKPHOS 56 04/29/2012 0906   BILITOT 0.51 12/05/2015 0928   BILITOT 0.5 06/27/2015 1521   BILITOT 0.90 04/29/2012 0906   GFRNONAA 58* 03/25/2013 0434   GFRAA 67* 03/25/2013 0434    No results found for: SPEP, UPEP  Lab Results  Component Value Date   WBC 6.1 12/05/2015   NEUTROABS 4.7 12/05/2015   HGB 15.0 12/05/2015   HCT 44.7 12/05/2015   MCV 88.5 12/05/2015   PLT 154 12/05/2015  Chemistry      Component Value Date/Time   NA 140 12/05/2015 0928   NA 141 05/19/2014 0957   NA 142 04/29/2012 0906   K 3.9 12/05/2015 0928   K 4.0 05/19/2014 0957   K 4.3 04/29/2012 0906   CL 104 05/19/2014 0957   CL 105 03/02/2013 0818   CL 99 04/29/2012 0906   CO2 24 12/05/2015 0928   CO2 31 05/19/2014 0957   CO2 28 04/29/2012 0906   BUN 16.8 12/05/2015 0928   BUN 16 05/19/2014 0957   BUN 18 04/29/2012 0906   CREATININE 1.1 12/05/2015 0928   CREATININE 1.1 05/19/2014 0957   CREATININE 1.3* 04/29/2012 0906      Component Value Date/Time   CALCIUM 8.9 12/05/2015 0928   CALCIUM 9.3 05/19/2014 0957   CALCIUM 8.6 04/29/2012 0906   ALKPHOS 64 12/05/2015 0928   ALKPHOS 58 06/27/2015 1521   ALKPHOS 56 04/29/2012 0906   AST 20 12/05/2015 0928   AST 19 06/27/2015 1521   AST 23 04/29/2012 0906   ALT 22 12/05/2015 0928   ALT 16 06/27/2015 1521   ALT 20 04/29/2012 0906   BILITOT 0.51  12/05/2015 0928   BILITOT 0.5 06/27/2015 1521   BILITOT 0.90 04/29/2012 0906      ASSESSMENT & PLAN:  History of kidney cancer He had her early-stage cancer which was cured by nephrectomy. He has no signs of recurrence. He will continue close follow-up with a urologist    History of B-cell lymphoma Clinically, he has no signs of disease recurrence. I recommend history, physical examination and blood work in one year. I would defer imaging study to the future to be done only if we suspect disease recurrence. The patient is educated about signs and symptoms to watch out for disease recurrence.    History of skin cancer He has no suspicious moles today. I recommend minimum annual skin check by dermatologist  Essential hypertension he will continue current medical management. I recommend close follow-up with his cardiologist for medication adjustment.   Preventive measure We discussed the importance of preventive care and reviewed the vaccination programs. He does not have any prior allergic reactions to influenza vaccinations and he is due. He agrees to proceed with influenza vaccination today and we will administer it today at the clinic. We also discussed colonoscopy screening. He will attempt to find his own report and if he is due, will set up appointment to see his gastroenterologist for repeat colonoscopy. He is currently undergoing surveillance PSA monitoring with his urologist     Orders Placed This Encounter  Procedures  . CBC with Differential/Platelet    Standing Status: Future     Number of Occurrences:      Standing Expiration Date: 01/08/2017  . Comprehensive metabolic panel    Standing Status: Future     Number of Occurrences:      Standing Expiration Date: 12/04/2016  . Lactate dehydrogenase (LDH) - [facility]    Standing Status: Future     Number of Occurrences:      Standing Expiration Date: 12/04/2016   All questions were answered. The patient knows to call the  clinic with any problems, questions or concerns. No barriers to learning was detected. I spent 20 minutes counseling the patient face to face. The total time spent in the appointment was 25 minutes and more than 50% was on counseling and review of test results     [facility], [place], MD 12/05/2015 10:57 AM

## 2015-12-05 NOTE — Assessment & Plan Note (Signed)
Clinically, he has no signs of disease recurrence. I recommend history, physical examination and blood work in one year. I would defer imaging study to the future to be done only if we suspect disease recurrence. The patient is educated about signs and symptoms to watch out for disease recurrence. 

## 2015-12-05 NOTE — Assessment & Plan Note (Signed)
We discussed the importance of preventive care and reviewed the vaccination programs. He does not have any prior allergic reactions to influenza vaccinations and he is due. He agrees to proceed with influenza vaccination today and we will administer it today at the clinic. We also discussed colonoscopy screening. He will attempt to find his own report and if he is due, will set up appointment to see his gastroenterologist for repeat colonoscopy. He is currently undergoing surveillance PSA monitoring with his urologist

## 2015-12-05 NOTE — Assessment & Plan Note (Signed)
He had her early-stage cancer which was cured by nephrectomy. He has no signs of recurrence. He will continue close follow-up with a urologist 

## 2015-12-05 NOTE — Telephone Encounter (Signed)
per pof to sch pt appt-gave pt copy of avs °

## 2016-01-12 ENCOUNTER — Other Ambulatory Visit: Payer: Self-pay | Admitting: Cardiovascular Disease

## 2016-01-12 MED ORDER — HYDROCHLOROTHIAZIDE 25 MG PO TABS: 25.0000 mg | ORAL_TABLET | Freq: Every day | ORAL | Status: DC

## 2016-01-12 MED ORDER — AMLODIPINE BESYLATE 5 MG PO TABS: 5.0000 mg | ORAL_TABLET | Freq: Every day | ORAL | Status: DC

## 2016-01-12 MED ORDER — POTASSIUM CHLORIDE CRYS ER 10 MEQ PO TBCR: 10.0000 meq | EXTENDED_RELEASE_TABLET | Freq: Every day | ORAL | Status: DC

## 2016-08-30 ENCOUNTER — Encounter: Payer: Self-pay | Admitting: Cardiovascular Disease

## 2016-08-31 ENCOUNTER — Ambulatory Visit (INDEPENDENT_AMBULATORY_CARE_PROVIDER_SITE_OTHER): Payer: BLUE CROSS/BLUE SHIELD | Admitting: Cardiovascular Disease

## 2016-08-31 ENCOUNTER — Encounter: Payer: Self-pay | Admitting: Cardiovascular Disease

## 2016-08-31 VITALS — BP 126/90 | HR 58 | Ht 70.0 in | Wt 229.4 lb

## 2016-08-31 DIAGNOSIS — I10 Essential (primary) hypertension: Secondary | ICD-10-CM

## 2016-08-31 NOTE — Patient Instructions (Signed)

## 2016-08-31 NOTE — Progress Notes (Signed)
Brian Parker Date of Birth  06-06-1950 [place] [address]    [place]   [place], [place]  [address]    [place],   [address] [phone]  Fax  [phone]  [phone]  Fax [phone]  1. Hypertension 2. Non Hodgkins Lymphoma  History of Present Illness:  Brian Parker has done well from a cardiac standpoint.  He recently had XRT for a growing lymph node in his abdomen.   His BP has been well controlled at home.    January 29, 2013: He continues to have issues with his non-hodgkins lymphoma.  He had XTR and has been declared in remission .  He had a  Left leg puncture would from a drill bit - never really healed well.  He recently had this would debrided and is scheduled to have a left knee replacement once this has healed.  He has not been able to exercise because of these leg issues.   Nov. 13, 2014: Brian Parker is doing well. His BP has been well controlled.  His lymphoma is still in remission.  Playing golf regularly.   May 19, 2014: Brian Parker is doing well.  Playing some golf.   BP has been well controlled.    He has been playing golf in the evenings ( walks 9 holes).   He is slowing losing wome weight.    Brian Parker, Brian Parker:   Brian Parker is doing well.  No CP .  Playing golf regularly .  Has been tracking what he eats .  Has been measuring his BP  several times a day   Sept. 1, 2017:  Doing well. Just got back from [place] playing golf.   Played with [name].  Is active.  Also went to [place] and [place].   Walking regularly .  Lymphoma appears to be under good control     Current Outpatient Prescriptions on File Prior to Visit  Medication Sig Dispense Refill  . amLODipine (NORVASC) 5 MG tablet Take 1 tablet (5 mg total) by mouth daily. 30 tablet 6  . aspirin EC 81 MG tablet Take 81 mg by mouth daily.      [name] doxazosin (CARDURA) 4 MG tablet TAKE 1/2 TABLET BY MOUTH EVERY NIGHT AT BEDTIME 30 tablet 5  . Fish  Oil-Krill Oil CAPS Take 1 capsule by mouth daily.    . hydrochlorothiazide (HYDRODIURIL) 25 MG tablet Take 1 tablet (25 mg total) by mouth daily. 30 tablet 6  . lisinopril (PRINIVIL,ZESTRIL) 10 MG tablet Take 1 tablet (10 mg total) by mouth daily. 90 tablet 3  . Multiple Vitamin (MULTIVITAMIN) tablet Take 1 tablet by mouth daily.      . potassium chloride (K-DUR,KLOR-CON) 10 MEQ tablet Take 1 tablet (10 mEq total) by mouth daily. 30 tablet 6   No current facility-administered medications on file prior to visit.   Amlodipine 5 mg a day   Allergies  Allergen Reactions  . Losartan     Stomach cramps.    Past Medical History:  Diagnosis Date  . Cancer of kidney, secondary ([facility]) 01/06/2010   right kidney//right kidney removed//left kidney functions WDL  . Chest pain    INTERMITTENT/15 years ago//stress induced  . Edema of lower extremity   . Elevated prostate specific antigen (PSA) 11/20/2011  . Follicular lymphoma grade I of intra-abdominal lymph nodes ([facility]) 12/31/2006  . History of B-cell lymphoma 12/5/Brian Parker  . Hypertension   .  Kidney carcinoma ([facility]) 03/01/2003   tumor seperate from lymphoma; found during routine screening for lymphoma  . NHL (non-Hodgkin's lymphoma) ([facility]) 12/06/2014  . Nodular lymphoma involving intra-abdominal lymph nodes ([place]) 12/20/2011  . Non Hodgkin's lymphoma [facility])     Past Surgical History:  Procedure Laterality Date  . APPENDECTOMY    . CARDIOVASCULAR STRESS TEST  10/19/2008   EF 52%, NO ISCHEMIA  . GROIN MASS OPEN BIOPSY    . HERNIA REPAIR  2006-2007   right laparoscopic  . HERNIA REPAIR  2006-2007   left laparoscopic  . NEPHRECTOMY     right kidney  . SHOULDER SURGERY    . TOTAL KNEE ARTHROPLASTY Left 03/25/2013   Dr Marlou Sa  . TOTAL KNEE ARTHROPLASTY Left 03/24/2013   Procedure: LEFT TOTAL KNEE ARTHROPLASTY;  Surgeon: Meredith Pel, MD;  Location: [facility];  Service: Orthopedics;  Laterality: Left;  Left Total Knee Arthroplasty  . US ECHOCARDIOGRAPHY   2/Parker/2007   EF 55-60%  . VENTRAL HERNIA REPAIR  2003-2004   with mesh after biopsy    History  Smoking Status  . Never Smoker  Smokeless Tobacco  . Never Used    History  Alcohol Use  . 4.2 oz/week  . 6 Glasses of wine, 1 Cans of beer per week    Comment: social    Family History  Problem Relation Age of Onset  . Uterine cancer Mother   . Cervical cancer Mother   . Coronary artery disease Father   . Hypertension Father     Reviw of Systems:  Reviewed in the HPI.  All other systems are negative.  Physical Exam: Blood pressure 126/90, pulse (!) 58, height 5\' 10"  (1.778 m), weight 229 lb 6.4 oz (104.1 kg). General: Well developed, well nourished, in no acute distress.  Head: Normocephalic, atraumatic, sclera non-icteric, mucus membranes are moist,   Neck: Supple. Negative for carotid bruits. JVD not elevated.  Lungs: Clear bilaterally to auscultation without wheezes, rales, or rhonchi. Breathing is unlabored.  Heart: RRR with S1 S2. No murmurs, rubs, or gallops appreciated.  Abdomen: Soft, non-tender, non-distended with normoactive bowel sounds. No hepatomegaly. No rebound/guarding. No obvious abdominal masses.  Msk:  Strength and tone appear normal for age.  Extremities: No clubbing or cyanosis. No edema.  Distal pedal pulses are 2+ and equal bilaterally.  Neuro: Alert and oriented X 3. Moves all extremities spontaneously.  Psych:  Responds to questions appropriately with a normal affect.  ECG:   Brian Parker, Brian Parker:  Sinus brady at 55.  No ST or T wave changes   Assessment / Plan:   1. Hypertension:   BP is well controlled .   Continue current meds.     2. Non Hodgkins Lymphoma Followed by his oncologist     Mertie Moores, MD  08/31/2016 10:15 AM    [address],  [place], [place]  [address] Pager [phone] Phone: [phone]; Fax: [phone]   [facility]  [address] [place], [place]  [address] [phone]    Fax [phone]

## 2016-10-10 ENCOUNTER — Other Ambulatory Visit: Payer: Self-pay | Admitting: Cardiovascular Disease

## 2016-10-10 DIAGNOSIS — Z79899 Other long term (current) drug therapy: Secondary | ICD-10-CM

## 2016-12-04 ENCOUNTER — Ambulatory Visit (HOSPITAL_BASED_OUTPATIENT_CLINIC_OR_DEPARTMENT_OTHER): Payer: BLUE CROSS/BLUE SHIELD | Admitting: Hematology and Oncology

## 2016-12-04 ENCOUNTER — Encounter: Payer: Self-pay | Admitting: Hematology and Oncology

## 2016-12-04 ENCOUNTER — Telehealth: Payer: Self-pay | Admitting: Hematology and Oncology

## 2016-12-04 ENCOUNTER — Other Ambulatory Visit (HOSPITAL_BASED_OUTPATIENT_CLINIC_OR_DEPARTMENT_OTHER): Payer: BLUE CROSS/BLUE SHIELD

## 2016-12-04 VITALS — BP 139/85 | HR 59 | Temp 98.3°F | Resp 18 | Ht 70.0 in | Wt 219.4 lb

## 2016-12-04 DIAGNOSIS — R970 Elevated carcinoembryonic antigen [CEA]: Secondary | ICD-10-CM

## 2016-12-04 DIAGNOSIS — Z85828 Personal history of other malignant neoplasm of skin: Secondary | ICD-10-CM | POA: Diagnosis not present

## 2016-12-04 DIAGNOSIS — Z8572 Personal history of non-Hodgkin lymphomas: Secondary | ICD-10-CM

## 2016-12-04 DIAGNOSIS — Z85528 Personal history of other malignant neoplasm of kidney: Secondary | ICD-10-CM

## 2016-12-04 DIAGNOSIS — Z23 Encounter for immunization: Secondary | ICD-10-CM

## 2016-12-04 DIAGNOSIS — Z515 Encounter for palliative care: Secondary | ICD-10-CM

## 2016-12-04 DIAGNOSIS — Z299 Encounter for prophylactic measures, unspecified: Secondary | ICD-10-CM

## 2016-12-04 DIAGNOSIS — R972 Elevated prostate specific antigen [PSA]: Secondary | ICD-10-CM

## 2016-12-04 LAB — COMPREHENSIVE METABOLIC PANEL
ALBUMIN: 4 g/dL (ref 3.5–5.0)
ALK PHOS: 60 U/L (ref 40–150)
ALT: 16 U/L (ref 0–55)
ANION GAP: 11 meq/L (ref 3–11)
AST: 21 U/L (ref 5–34)
BILIRUBIN TOTAL: 0.9 mg/dL (ref 0.20–1.20)
BUN: 13.8 mg/dL (ref 7.0–26.0)
CO2: 26 mEq/L (ref 22–29)
Calcium: 9.1 mg/dL (ref 8.4–10.4)
Chloride: 105 mEq/L (ref 98–109)
Creatinine: 1.1 mg/dL (ref 0.7–1.3)
EGFR: 73 mL/min/{1.73_m2} — AB (ref >90)
Glucose: 95 mg/dl (ref 70–140)
POTASSIUM: 4.2 meq/L (ref 3.5–5.1)
SODIUM: 142 meq/L (ref 136–145)
TOTAL PROTEIN: 6.5 g/dL (ref 6.4–8.3)

## 2016-12-04 LAB — CBC WITH DIFFERENTIAL/PLATELET
BASO%: 0.5 % (ref 0.0–2.0)
BASOS ABS: 0 10*3/uL (ref 0.0–0.1)
EOS ABS: 0.1 10*3/uL (ref 0.0–0.5)
EOS%: 2.5 % (ref 0.0–7.0)
HCT: 47.2 % (ref 38.4–49.9)
HEMOGLOBIN: 15.6 g/dL (ref 13.0–17.1)
LYMPH%: 17.1 % (ref 14.0–49.0)
MCH: 29.4 pg (ref 27.2–33.4)
MCHC: 33.1 g/dL (ref 32.0–36.0)
MCV: 88.8 fL (ref 79.3–98.0)
MONO#: 0.4 10*3/uL (ref 0.1–0.9)
MONO%: 6.7 % (ref 0.0–14.0)
NEUT%: 73.2 % (ref 39.0–75.0)
NEUTROS ABS: 4.3 10*3/uL (ref 1.5–6.5)
PLATELETS: 150 10*3/uL (ref 140–400)
RBC: 5.31 10*6/uL (ref 4.20–5.82)
RDW: 13.1 % (ref 11.0–14.6)
WBC: 5.8 10*3/uL (ref 4.0–10.3)
lymph#: 1 10*3/uL (ref 0.9–3.3)

## 2016-12-04 LAB — LACTATE DEHYDROGENASE: LDH: 180 U/L (ref 125–245)

## 2016-12-04 MED ORDER — INFLUENZA VAC SPLIT QUAD 0.5 ML IM SUSY
0.5000 mL | PREFILLED_SYRINGE | Freq: Once | INTRAMUSCULAR | Status: AC
  Administered 2016-12-04: 0.5 mL via INTRAMUSCULAR
  Filled 2016-12-04: qty 0.5

## 2016-12-04 NOTE — Assessment & Plan Note (Signed)
We discussed the importance of preventive care and reviewed the vaccination programs. He does not have any prior allergic reactions to influenza vaccination. He agrees to proceed with influenza vaccination today and we will administer it today at the clinic.  

## 2016-12-04 NOTE — Assessment & Plan Note (Signed)
He has no suspicious moles today. I recommend minimum annual skin check by dermatologist 

## 2016-12-04 NOTE — Assessment & Plan Note (Signed)
He will continue close follow-up with urologist. 

## 2016-12-04 NOTE — Assessment & Plan Note (Signed)
He had her early-stage cancer which was cured by nephrectomy. He has no signs of recurrence. He will continue close follow-up with a urologist

## 2016-12-04 NOTE — Assessment & Plan Note (Signed)
Clinically, he has no signs of disease recurrence. I recommend history, physical examination and blood work in one year. I would defer imaging study to the future to be done only if we suspect disease recurrence. The patient is educated about signs and symptoms to watch out for disease recurrence.

## 2016-12-04 NOTE — Progress Notes (Signed)
[facility] OFFICE PROGRESS NOTE  Patient Care Team: Hulan Fess, MD as PCP - General (Family Medicine) Arloa Koh, MD as Consulting Physician (Radiation Oncology) Kathie Rhodes, MD as Consulting Physician (Urology) Heath Lark, MD as Consulting Physician (Hematology and Oncology)  SUMMARY OF ONCOLOGIC HISTORY:  I reviewed the patient's records extensive and collaborated the history with the patient. Summary of his history is as follows: He was initially diagnosed with a follicular grade 1,  B-cell, non-Hodgkin's lymphoma in 2003 . He has also been diagnosed with metachronous primary tumors of his right kidney and ultimately underwent initial partial nephrectomy in March 2004 then a completion nephrectomy in January 2011. With regard to his lymphoma, he was treated with 8 cycles of CHOP/Rituxan through July 2003. He was started on maintenance rituximab but only had 2 cycles due to development of delayed neutropenia.  There was suspicion for early progression on a CT scan done in July of 2009 which showed a single area of soft tissue density in the left periaortic region. This area remained stable on serial scans until a study done on 11/13/2011 which did show clear regrowth in this area enlarging from 2.6 x 0.9 cm to approximately 4.9 x 3.4 cm. A PET scan was done on 11/30/2011 which showed a single area of markedly abnormal activity with SUV up to 10.3 over the abnormal area of lymphadenopathy seen on CT scan. There were no other areas of activity.  Given this fact, he was referred to radiation oncologist, seen by Dr. Arloa Koh and received 3000 cGy in 15 fractions to the left para-aortic lymph node mass between January 16 and 02/04/2012. He tolerated treatment well.  A post treatment CT scan done 04/29/2012 shown a further reduction down to almost imperceptible amount of soft tissue in the periaortic area.  He follows closely with urologist for mildly elevated PSA  INTERVAL  HISTORY: Please see below for problem oriented charting. He feels well. He had lost some weight due to dietary modification. He continues close follow-up with urologist. He had recent skin moles resected by dermatologist came back benign. He is vigilant with skin examination and skin protection with sunscreen. He denies new lymphadenopathy. Denies recent infection  REVIEW OF SYSTEMS:   Constitutional: Denies fevers, chills or abnormal weight loss Eyes: Denies blurriness of vision Ears, nose, mouth, throat, and face: Denies mucositis or sore throat Respiratory: Denies cough, dyspnea or wheezes Cardiovascular: Denies palpitation, chest discomfort or lower extremity swelling Gastrointestinal:  Denies nausea, heartburn or change in bowel habits Skin: Denies abnormal skin rashes Lymphatics: Denies new lymphadenopathy or easy bruising Neurological:Denies numbness, tingling or new weaknesses Behavioral/Psych: Mood is stable, no new changes  All other systems were reviewed with the patient and are negative.  I have reviewed the past medical history, past surgical history, social history and family history with the patient and they are unchanged from previous note.  ALLERGIES:  is allergic to losartan.  MEDICATIONS:  Current Outpatient Prescriptions  Medication Sig Dispense Refill  . amLODipine (NORVASC) 5 MG tablet Take 1 tablet (5 mg total) by mouth daily. 30 tablet 6  . aspirin EC 81 MG tablet Take 81 mg by mouth daily.      [name] doxazosin (CARDURA) 4 MG tablet TAKE 1/2 TABLET BY MOUTH EVERY NIGHT AT BEDTIME 30 tablet 5  . Fish Oil-Krill Oil CAPS Take 1 capsule by mouth daily.    . hydrochlorothiazide (HYDRODIURIL) 25 MG tablet Take 1 tablet (25 mg total) by mouth daily.  30 tablet 6  . lisinopril (PRINIVIL,ZESTRIL) 10 MG tablet TAKE 1 TABLET(10 MG) BY MOUTH DAILY 90 tablet 0  . Multiple Vitamin (MULTIVITAMIN) tablet Take 1 tablet by mouth daily.      . potassium chloride (K-DUR,KLOR-CON) 10  MEQ tablet Take 1 tablet (10 mEq total) by mouth daily. 30 tablet 6   No current facility-administered medications for this visit.     PHYSICAL EXAMINATION: ECOG PERFORMANCE STATUS: 0 - Asymptomatic  Vitals:   12/04/16 1016  BP: 139/85  Pulse: (!) 59  Resp: 18  Temp: 98.3 F (36.8 C)   Filed Weights   12/04/16 1016  Weight: 219 lb 6.4 oz (99.5 kg)    GENERAL:alert, no distress and comfortable SKIN: skin color, texture, turgor are normal, no rashes or significant lesions EYES: normal, Conjunctiva are pink and non-injected, sclera clear OROPHARYNX:no exudate, no erythema and lips, buccal mucosa, and tongue normal  NECK: supple, thyroid normal size, non-tender, without nodularity LYMPH:  no palpable lymphadenopathy in the cervical, axillary or inguinal LUNGS: clear to auscultation and percussion with normal breathing effort HEART: regular rate & rhythm and no murmurs and no lower extremity edema ABDOMEN:abdomen soft, non-tender and normal bowel sounds Musculoskeletal:no cyanosis of digits and no clubbing  NEURO: alert & oriented x 3 with fluent speech, no focal motor/sensory deficits  LABORATORY DATA:  I have reviewed the data as listed    Component Value Date/Time   NA 142 12/04/2016 1001   K 4.2 12/04/2016 1001   CL 104 05/19/2014 0957   CL 105 03/02/2013 0818   CO2 26 12/04/2016 1001   GLUCOSE 95 12/04/2016 1001   GLUCOSE 88 03/02/2013 0818   BUN 13.8 12/04/2016 1001   CREATININE 1.1 12/04/2016 1001   CALCIUM 9.1 12/04/2016 1001   PROT 6.5 12/04/2016 1001   ALBUMIN 4.0 12/04/2016 1001   AST 21 12/04/2016 1001   ALT 16 12/04/2016 1001   ALKPHOS 60 12/04/2016 1001   BILITOT 0.90 12/04/2016 1001   GFRNONAA 58 (L) 03/25/2013 0434   GFRAA 67 (L) 03/25/2013 0434    No results found for: SPEP, UPEP  Lab Results  Component Value Date   WBC 5.8 12/04/2016   NEUTROABS 4.3 12/04/2016   HGB 15.6 12/04/2016   HCT 47.2 12/04/2016   MCV 88.8 12/04/2016   PLT 150  12/04/2016      Chemistry      Component Value Date/Time   NA 142 12/04/2016 1001   K 4.2 12/04/2016 1001   CL 104 05/19/2014 0957   CL 105 03/02/2013 0818   CO2 26 12/04/2016 1001   BUN 13.8 12/04/2016 1001   CREATININE 1.1 12/04/2016 1001      Component Value Date/Time   CALCIUM 9.1 12/04/2016 1001   ALKPHOS 60 12/04/2016 1001   AST 21 12/04/2016 1001   ALT 16 12/04/2016 1001   BILITOT 0.90 12/04/2016 1001      ASSESSMENT & PLAN:  History of B-cell lymphoma Clinically, he has no signs of disease recurrence. I recommend history, physical examination and blood work in one year. I would defer imaging study to the future to be done only if we suspect disease recurrence. The patient is educated about signs and symptoms to watch out for disease recurrence.  History of kidney cancer He had her early-stage cancer which was cured by nephrectomy. He has no signs of recurrence. He will continue close follow-up with a urologist  History of skin cancer He has no suspicious moles today. I recommend  minimum annual skin check by dermatologist  Elevated prostate specific antigen (PSA) He will continue close follow-up with urologist.  Preventive measure We discussed the importance of preventive care and reviewed the vaccination programs. He does not have any prior allergic reactions to influenza vaccination. He agrees to proceed with influenza vaccination today and we will administer it today at the clinic.    Orders Placed This Encounter  Procedures  . CBC with Differential/Platelet    Standing Status:   Future    Standing Expiration Date:   01/08/2018  . Comprehensive metabolic panel    Standing Status:   Future    Standing Expiration Date:   01/08/2018  . Lactate dehydrogenase    Standing Status:   Future    Standing Expiration Date:   01/08/2018   All questions were answered. The patient knows to call the clinic with any problems, questions or concerns. No barriers to learning was  detected. I spent 15 minutes counseling the patient face to face. The total time spent in the appointment was 20 minutes and more than 50% was on counseling and review of test results     Heath Lark, MD 12/04/2016 12:37 PM

## 2016-12-04 NOTE — Telephone Encounter (Signed)
Appointments scheduled per, 12/04/16 los. A copy of the AVS report and appointment schedule was given to the patient, per 12/04/16 los. ° °

## 2016-12-20 ENCOUNTER — Other Ambulatory Visit: Payer: Self-pay | Admitting: Cardiovascular Disease

## 2017-02-19 ENCOUNTER — Other Ambulatory Visit: Payer: Self-pay | Admitting: Cardiovascular Disease

## 2017-02-20 ENCOUNTER — Other Ambulatory Visit: Payer: Self-pay | Admitting: Cardiovascular Disease

## 2017-02-21 NOTE — Telephone Encounter (Signed)
Approved    Disp Refills Start End  amLODipine (NORVASC) 5 MG tablet 30 tablet 6 02/20/2017   Sig:  TAKE 1 TABLET(5 MG) BY MOUTH DAILY  Class:  Normal  DAW:  No  Authorizing Provider:  Thayer Headings, MD  Ordering User:  Juventino Slovak, CMA  hydrochlorothiazide (HYDRODIURIL) 25 MG tablet 30 tablet 6 02/20/2017   Sig:  TAKE 1 TABLET(25 MG) BY MOUTH DAILY  Class:  Normal  DAW:  No  Authorizing Provider:  Thayer Headings, MD  Ordering User:  Juventino Slovak, CMA  Visit Pharmacy   [id number] - [place], [address] AT [address] [address]

## 2017-02-23 ENCOUNTER — Emergency Department (HOSPITAL_COMMUNITY)
Admission: EM | Admit: 2017-02-23 | Discharge: 2017-02-23 | Disposition: A | Discharge status: Home / Self Care | Payer: BLUE CROSS/BLUE SHIELD | Attending: Emergency Medicine | Admitting: Emergency Medicine

## 2017-02-23 ENCOUNTER — Encounter (HOSPITAL_COMMUNITY): Payer: Self-pay | Admitting: Emergency Medicine

## 2017-02-23 ENCOUNTER — Emergency Department (HOSPITAL_COMMUNITY): Payer: BLUE CROSS/BLUE SHIELD

## 2017-02-23 ENCOUNTER — Telehealth: Payer: Self-pay | Admitting: Cardiology

## 2017-02-23 DIAGNOSIS — Z79899 Other long term (current) drug therapy: Secondary | ICD-10-CM | POA: Diagnosis not present

## 2017-02-23 DIAGNOSIS — I493 Ventricular premature depolarization: Secondary | ICD-10-CM | POA: Diagnosis not present

## 2017-02-23 DIAGNOSIS — I10 Essential (primary) hypertension: Secondary | ICD-10-CM | POA: Insufficient documentation

## 2017-02-23 DIAGNOSIS — Z7982 Long term (current) use of aspirin: Secondary | ICD-10-CM | POA: Diagnosis not present

## 2017-02-23 DIAGNOSIS — Z96653 Presence of artificial knee joint, bilateral: Secondary | ICD-10-CM | POA: Diagnosis not present

## 2017-02-23 DIAGNOSIS — R002 Palpitations: Secondary | ICD-10-CM | POA: Diagnosis present

## 2017-02-23 LAB — CBC WITH DIFFERENTIAL/PLATELET
BASOS PCT: 0 %
Basophils Absolute: 0 10*3/uL (ref 0.0–0.1)
EOS ABS: 0.1 10*3/uL (ref 0.0–0.7)
Eosinophils Relative: 1 %
HCT: 43.9 % (ref 39.0–52.0)
HEMOGLOBIN: 15.3 g/dL (ref 13.0–17.0)
Lymphocytes Relative: 12 %
Lymphs Abs: 0.8 10*3/uL (ref 0.7–4.0)
MCH: 30.4 pg (ref 26.0–34.0)
MCHC: 34.9 g/dL (ref 30.0–36.0)
MCV: 87.3 fL (ref 78.0–100.0)
MONOS PCT: 3 %
Monocytes Absolute: 0.2 10*3/uL (ref 0.1–1.0)
NEUTROS PCT: 84 %
Neutro Abs: 5.8 10*3/uL (ref 1.7–7.7)
Platelets: 141 10*3/uL — ABNORMAL LOW (ref 150–400)
RBC: 5.03 MIL/uL (ref 4.22–5.81)
RDW: 13 % (ref 11.5–15.5)
WBC: 6.9 10*3/uL (ref 4.0–10.5)

## 2017-02-23 LAB — COMPREHENSIVE METABOLIC PANEL
ALK PHOS: 55 U/L (ref 38–126)
ALT: 15 U/L — ABNORMAL LOW (ref 17–63)
ANION GAP: 9 (ref 5–15)
AST: 19 U/L (ref 15–41)
Albumin: 4.5 g/dL (ref 3.5–5.0)
BILIRUBIN TOTAL: 0.7 mg/dL (ref 0.3–1.2)
BUN: 22 mg/dL — ABNORMAL HIGH (ref 6–20)
CALCIUM: 9.2 mg/dL (ref 8.9–10.3)
CO2: 26 mmol/L (ref 22–32)
Chloride: 106 mmol/L (ref 101–111)
Creatinine, Ser: 0.98 mg/dL (ref 0.61–1.24)
GFR calc non Af Amer: >60 mL/min (ref >60)
GLUCOSE: 132 mg/dL — AB (ref 65–99)
Potassium: 3.5 mmol/L (ref 3.5–5.1)
Sodium: 141 mmol/L (ref 135–145)
Total Protein: 6.5 g/dL (ref 6.5–8.1)

## 2017-02-23 LAB — MAGNESIUM: MAGNESIUM: 2.1 mg/dL (ref 1.7–2.4)

## 2017-02-23 LAB — TSH: TSH: 0.955 u[IU]/mL (ref 0.350–4.500)

## 2017-02-23 LAB — TROPONIN I

## 2017-02-23 MED ORDER — SODIUM CHLORIDE 0.9 % IV BOLUS (SEPSIS): 1000.0000 mL | Freq: Once | INTRAVENOUS | Status: DC

## 2017-02-23 NOTE — Telephone Encounter (Signed)
Pt called with fluttering in chest, I called back and suggested he go to ER.  Had to leave a message.

## 2017-02-23 NOTE — ED Provider Notes (Signed)
[facility] DEPT Provider Note   CSN: [id number] Arrival date & time: 02/23/17  1221     History   Chief Complaint Chief Complaint  Patient presents with  . heart flutter    HPI Brian Parker is a 67 y.o. male.  Pt presents to the ED today with palpitations.  The pt said that he feels them off and on.  It has been going on for about a week.  The pt sees Dr. Acie Fredrickson (cards) for his elevated bp.  He called him this morning and spoke with the PA who told him to come to the ED for eval.  The pt has not been on any new rx meds.  He denies otc meds.  He does not smoke, but does drink a steady stream of coffee.  Pt denies any cp, but has noticed some pain in his left shoulder.      Past Medical History:  Diagnosis Date  . Cancer of kidney, secondary ([place]) 01/06/2010   right kidney//right kidney removed//left kidney functions WDL  . Chest pain    INTERMITTENT/15 years ago//stress induced  . Edema of lower extremity   . Elevated prostate specific antigen (PSA) 11/20/2011  . Follicular lymphoma grade I of intra-abdominal lymph nodes ([place]) 12/31/2006  . History of B-cell lymphoma 12/05/2015  . Hypertension   . Kidney carcinoma ([place]) 03/01/2003   tumor seperate from lymphoma; found during routine screening for lymphoma  . NHL (non-Hodgkin's lymphoma) ([place]) 12/06/2014  . Nodular lymphoma involving intra-abdominal lymph nodes ([place]) 12/20/2011  . Non Hodgkin's lymphoma [facility])     Patient Active Problem List   Diagnosis Date Noted  . History of B-cell lymphoma 12/05/2015  . History of skin cancer 12/05/2015  . Preventive measure 12/06/2014  . Elevated prostate specific antigen (PSA) 11/20/2011  . Essential hypertension 11/09/2011  . History of kidney cancer 03/01/2003    Past Surgical History:  Procedure Laterality Date  . APPENDECTOMY    . CARDIOVASCULAR STRESS TEST  10/19/2008   EF 52%, NO ISCHEMIA  . GROIN MASS OPEN BIOPSY    . HERNIA REPAIR  2006-2007   right laparoscopic  .  HERNIA REPAIR  2006-2007   left laparoscopic  . NEPHRECTOMY     right kidney  . SHOULDER SURGERY    . TOTAL KNEE ARTHROPLASTY Left 03/25/2013   Dr Marlou Sa  . TOTAL KNEE ARTHROPLASTY Left 03/24/2013   Procedure: LEFT TOTAL KNEE ARTHROPLASTY;  Surgeon: Meredith Pel, MD;  Location: [facility];  Service: Orthopedics;  Laterality: Left;  Left Total Knee Arthroplasty  . US ECHOCARDIOGRAPHY  02/26/2006   EF 55-60%  . VENTRAL HERNIA REPAIR  2003-2004   with mesh after biopsy       Home Medications    Prior to Admission medications   Medication Sig Start Date End Date Taking? Authorizing Provider  amLODipine (NORVASC) 5 MG tablet TAKE 1 TABLET(5 MG) BY MOUTH DAILY 02/20/17  Yes Thayer Headings, MD  aspirin EC 81 MG tablet Take 81 mg by mouth daily.     Yes Historical Provider, MD  doxazosin (CARDURA) 4 MG tablet TAKE 1/2 TABLET BY MOUTH EVERY NIGHT AT BEDTIME 12/20/16  Yes Thayer Headings, MD  Fish Oil-Krill Oil CAPS Take 1 capsule by mouth daily.   Yes Historical Provider, MD  hydrochlorothiazide (HYDRODIURIL) 25 MG tablet TAKE 1 TABLET(25 MG) BY MOUTH DAILY 02/20/17  Yes Thayer Headings, MD  lisinopril (PRINIVIL,ZESTRIL) 10 MG tablet TAKE 1 TABLET(10 MG) BY MOUTH DAILY  10/10/16  Yes Thayer Headings, MD  Multiple Vitamin (MULTIVITAMIN) tablet Take 1 tablet by mouth daily.     Yes Historical Provider, MD  potassium chloride (K-DUR,KLOR-CON) 10 MEQ tablet Take 1 tablet (10 mEq total) by mouth daily. 01/12/16  Yes Thayer Headings, MD    Family History Family History  Problem Relation Age of Onset  . Uterine cancer Mother   . Cervical cancer Mother   . Coronary artery disease Father   . Hypertension Father     Social History Social History  Substance Use Topics  . Smoking status: Never Smoker  . Smokeless tobacco: Never Used  . Alcohol use 4.2 oz/week    6 Glasses of wine, 1 Cans of beer per week     Comment: social     Allergies   Losartan   Review of Systems Review of Systems    Cardiovascular: Positive for palpitations.  All other systems reviewed and are negative.    Physical Exam Updated Vital Signs There were no vitals taken for this visit.  Physical Exam  Constitutional: He is oriented to person, place, and time. He appears well-developed and well-nourished.  HENT:  Head: Normocephalic and atraumatic.  Right Ear: External ear normal.  Left Ear: External ear normal.  Nose: Nose normal.  Mouth/Throat: Oropharynx is clear and moist.  Eyes: Conjunctivae and EOM are normal. Pupils are equal, round, and reactive to light.  Neck: Normal range of motion. Neck supple.  Cardiovascular: Normal rate, normal heart sounds and intact distal pulses.  An irregular rhythm present.  PVCs on the monitor.  Pulmonary/Chest: Effort normal and breath sounds normal.  Abdominal: Soft. Bowel sounds are normal.  Musculoskeletal: Normal range of motion.  Neurological: He is alert and oriented to person, place, and time.  Skin: Skin is warm.  Psychiatric: He has a normal mood and affect. His behavior is normal. Judgment and thought content normal.  Nursing note and vitals reviewed.    ED Treatments / Results  Labs (all labs ordered are listed, but only abnormal results are displayed) Labs Reviewed  COMPREHENSIVE METABOLIC PANEL - Abnormal; Notable for the following:       Result Value   Glucose, Bld 132 (*)    BUN 22 (*)    ALT 15 (*)    All other components within normal limits  CBC WITH DIFFERENTIAL/PLATELET - Abnormal; Notable for the following:    Platelets 141 (*)    All other components within normal limits  TROPONIN I  MAGNESIUM  TSH  URINALYSIS, ROUTINE W REFLEX MICROSCOPIC    EKG  EKG Interpretation  Date/Time:  Saturday February 23 2017 12:30:46 EST Ventricular Rate:  80 PR Interval:    QRS Duration: 110 QT Interval:  426 QTC Calculation: 492 R Axis:   -8 Text Interpretation:  Sinus rhythm Abnormal R-wave progression, early transition  Borderline prolonged QT interval Confirmed by Phila Shoaf MD, Stephanos Fan ([id number]) on 02/23/2017 12:37:21 PM       Radiology No results found.  Procedures Procedures (including critical care time)  Medications Ordered in ED Medications - No data to display   Initial Impression / Assessment and Plan / ED Course  I have reviewed the triage vital signs and the nursing notes.  Pertinent labs & imaging results that were available during my care of the patient were reviewed by me and considered in my medical decision making (see chart for details).    Pt did not want an IV/IVFs/CXR/shoulder x-ray, but would let us  do labs.  The pt has an insurance plan that does not cover much, so he was worried about cost.  The pt did say that he could call his cardiologist on Monday the 26th to get seen.    Pt encouraged to decrease his caffeine intake to try to get rid of the PVCs.  He knows to return if worse.  Final Clinical Impressions(s) / ED Diagnoses   Final diagnoses:  PVC (premature ventricular contraction)    New Prescriptions New Prescriptions   No medications on file     Isla Pence, MD 02/23/17 [phone]

## 2017-02-23 NOTE — ED Triage Notes (Addendum)
Per pt, states he feels like his heart is fluttering-states it has been going on for a week-sensation is on and off-states he feels like heart is skipping a beat-no chest pian

## 2017-02-25 ENCOUNTER — Telehealth: Payer: Self-pay | Admitting: Cardiovascular Disease

## 2017-02-25 NOTE — Telephone Encounter (Signed)
Spoke with patient who states he went to [facility] ER Saturday morning with fluttering in his chest. He states  EKG showed PVCs and ER doctor told him to f/u with cardiologist this week. He states he took an additional [place] because his K+ level was 3.5 and he was advised this could cause increased PVCs. He has been drinking 8 cups of coffee per day and states he was advised to decrease caffeine intake. He states BP is normal today and HR is 52 bpm. He requests an appointment with Dr. Acie Fredrickson. I scheduled him for office visit tomorrow, 2/27 at 10:15. He verbalized understanding and agreement and thanked me for the call.

## 2017-02-25 NOTE — Telephone Encounter (Signed)
New Message  Pt call requesting to speak with RN. Pt states he was released from the hospital and was told to f/u with Dr. Acie Fredrickson on today. Pt would like to be seen today. Please call back to discuss

## 2017-02-26 ENCOUNTER — Encounter: Payer: Self-pay | Admitting: Cardiovascular Disease

## 2017-02-26 ENCOUNTER — Ambulatory Visit (INDEPENDENT_AMBULATORY_CARE_PROVIDER_SITE_OTHER): Payer: BLUE CROSS/BLUE SHIELD | Admitting: Cardiovascular Disease

## 2017-02-26 VITALS — BP 126/76 | HR 64 | Ht 70.0 in | Wt 210.1 lb

## 2017-02-26 DIAGNOSIS — I10 Essential (primary) hypertension: Secondary | ICD-10-CM

## 2017-02-26 DIAGNOSIS — I493 Ventricular premature depolarization: Secondary | ICD-10-CM | POA: Diagnosis not present

## 2017-02-26 MED ORDER — HYDROCHLOROTHIAZIDE 25 MG PO TABS: 25.0000 mg | ORAL_TABLET | Freq: Every day | ORAL | 3 refills | Status: DC

## 2017-02-26 MED ORDER — POTASSIUM CHLORIDE CRYS ER 20 MEQ PO TBCR: 20.0000 meq | EXTENDED_RELEASE_TABLET | Freq: Every day | ORAL | 3 refills | Status: DC

## 2017-02-26 NOTE — Progress Notes (Signed)
Brian Parker Date of Birth  1950/12/10 [place] [address]    [place], [place]  [address]    [place], [place]  [address] [phone]  Fax  [phone]  [phone]  Fax [phone]  1. Hypertension 2. Non Hodgkins Lymphoma  History of Present Illness:  Brian Parker has done well from a cardiac standpoint.  He recently had XRT for a growing lymph node in his abdomen.   His BP has been well controlled at home.    January 29, 2013: He continues to have issues with his non-hodgkins lymphoma.  He had XTR and has been declared in remission .  He had a  Left leg puncture would from a drill bit - never really healed well.  He recently had this would debrided and is scheduled to have a left knee replacement once this has healed.  He has not been able to exercise because of these leg issues.   Nov. 13, 2014: Brian Parker is doing well. His BP has been well controlled.  His lymphoma is still in remission.  Playing golf regularly.   May 19, 2014: Brian Parker is doing well.  Playing some golf.   BP has been well controlled.    He has been playing golf in the evenings ( walks 9 holes).   He is slowing losing wome weight.    June 27, 2015:   Brian Parker is doing well.  No CP .  Playing golf regularly .  Has been tracking what he eats .  Has been measuring his BP  several times a day   Sept. 1, 2017:  Doing well. Just got back from [place] playing golf.   Played with [name].  Is active.  Also went to [place] and [place].   Walking regularly .  Lymphoma appears to be under good control   Feb. 27, 2018 Doing well Has felt some palpitations Occurs several times ( 15-20 times this AM already )  Not related to activity Seems to be worse when he is sitting  Not associated with CP or dyspnea or pre-syncope.  Went to the ER , was found to have PVCs  Potassium was low. Sleeping well, more caffeine recently  Walks regularly  -  walked 18 holes of golf on Sunday , no issues with walking    Current Outpatient Prescriptions on File Prior to Visit  Medication Sig Dispense Refill  . amLODipine (NORVASC) 5 MG tablet TAKE 1 TABLET(5 MG) BY MOUTH DAILY 30 tablet 6  . aspirin EC 81 MG tablet Take 81 mg by mouth daily.      [name] doxazosin (CARDURA) 4 MG tablet TAKE 1/2 TABLET BY MOUTH EVERY NIGHT AT BEDTIME 45 tablet 2  . Fish Oil-Krill Oil CAPS Take 1 capsule by mouth daily.    . hydrochlorothiazide (HYDRODIURIL) 25 MG tablet TAKE 1 TABLET(25 MG) BY MOUTH DAILY 30 tablet 6  . lisinopril (PRINIVIL,ZESTRIL) 10 MG tablet TAKE 1 TABLET(10 MG) BY MOUTH DAILY 90 tablet 0  . Multiple Vitamin (MULTIVITAMIN) tablet Take 1 tablet by mouth daily.      . potassium chloride (K-DUR,KLOR-CON) 10 MEQ tablet Take 1 tablet (10 mEq total) by mouth daily. 30 tablet 6   No current facility-administered medications on file prior to visit.   Amlodipine 5 mg a day   Allergies  Allergen Reactions  . Losartan     Stomach cramps.  Past Medical History:  Diagnosis Date  . Cancer of kidney, secondary ([place]) 01/06/2010   right kidney//right kidney removed//left kidney functions WDL  . Chest pain    INTERMITTENT/15 years ago//stress induced  . Edema of lower extremity   . Elevated prostate specific antigen (PSA) 11/20/2011  . Follicular lymphoma grade I of intra-abdominal lymph nodes ([place]) 12/31/2006  . History of B-cell lymphoma 12/05/2015  . Hypertension   . Kidney carcinoma ([place]) 03/01/2003   tumor seperate from lymphoma; found during routine screening for lymphoma  . NHL (non-Hodgkin's lymphoma) ([place]) 12/06/2014  . Nodular lymphoma involving intra-abdominal lymph nodes ([place]) 12/20/2011  . Non Hodgkin's lymphoma [facility])     Past Surgical History:  Procedure Laterality Date  . APPENDECTOMY    . CARDIOVASCULAR STRESS TEST  10/19/2008   EF 52%, NO ISCHEMIA  . GROIN MASS OPEN BIOPSY    . HERNIA REPAIR  2006-2007   right laparoscopic  . HERNIA  REPAIR  2006-2007   left laparoscopic  . NEPHRECTOMY     right kidney  . SHOULDER SURGERY    . TOTAL KNEE ARTHROPLASTY Left 03/25/2013   Dr Marlou Sa  . TOTAL KNEE ARTHROPLASTY Left 03/24/2013   Procedure: LEFT TOTAL KNEE ARTHROPLASTY;  Surgeon: Meredith Pel, MD;  Location: [facility];  Service: Orthopedics;  Laterality: Left;  Left Total Knee Arthroplasty  . US ECHOCARDIOGRAPHY  02/26/2006   EF 55-60%  . VENTRAL HERNIA REPAIR  2003-2004   with mesh after biopsy    History  Smoking Status  . Never Smoker  Smokeless Tobacco  . Never Used    History  Alcohol Use  . 4.2 oz/week  . 6 Glasses of wine, 1 Cans of beer per week    Comment: social    Family History  Problem Relation Age of Onset  . Uterine cancer Mother   . Cervical cancer Mother   . Coronary artery disease Father   . Hypertension Father     Reviw of Systems:  Reviewed in the HPI.  All other systems are negative.  Physical Exam: Blood pressure 126/76, pulse 64, height 5\' 10"  (1.778 m), weight 210 lb 1.9 oz (95.3 kg), SpO2 98 %. General: Well developed, well nourished, in no acute distress.  Head: Normocephalic, atraumatic, sclera non-icteric, mucus membranes are moist,   Neck: Supple. Negative for carotid bruits. JVD not elevated.  Lungs: Clear bilaterally to auscultation without wheezes, rales, or rhonchi. Breathing is unlabored.  Heart: RRR with S1 S2.  Rare PVCs No murmurs, rubs, or gallops appreciated.  Abdomen: Soft, non-tender, non-distended with normoactive bowel sounds. No hepatomegaly. No rebound/guarding. No obvious abdominal masses.  Msk:  Strength and tone appear normal for age.  Extremities: No clubbing or cyanosis. 1+  edema.  Distal pedal pulses are 2+ and equal bilaterally.  Neuro: Alert and oriented X 3. Moves all extremities spontaneously.  Psych:  Responds to questions appropriately with a normal affect.  ECG:  Assessment / Plan:   1. Hypertension:   BP is well controlled .     Continue current meds.  Is eating Nutrasystem meals now and has been eating more salt than he should . Has some leg Edema. I've advised him to watch his salt intake.  2. Premature ventricular contractions:   Associated with low potassium when he was in the emergency room the other day. Will increase his Kdur to 20 meq a day , will check BMP in 3 weeks.  I've reassured him that these are benign.  2. Non Hodgkins Lymphoma Followed by his oncologist     Mertie Moores, MD  02/26/2017 10:43 AM    [address] [place],  [place], [place]  [address] Pager [phone] Phone: [phone]; Fax: [phone]

## 2017-02-26 NOTE — Patient Instructions (Signed)
Medication Instructions:  INCREASE Kdur (potassium) to 20 meq once daily   Labwork: Your physician recommends that you return for lab work in: 3 weeks for basic metabolic panel   Testing/Procedures: None Ordered   Follow-Up: Your physician wants you to follow-up in: 6 months with Dr. Acie Fredrickson.  You will receive a reminder letter in the mail two months in advance. If you don't receive a letter, please call our office to schedule the follow-up appointment.   If you need a refill on your cardiac medications before your next appointment, please call your pharmacy.   Thank you for choosing [facility]! Christen Bame, RN [phone]

## 2017-03-19 ENCOUNTER — Other Ambulatory Visit: Payer: BLUE CROSS/BLUE SHIELD

## 2017-03-20 ENCOUNTER — Other Ambulatory Visit: Payer: BLUE CROSS/BLUE SHIELD | Admitting: *Deleted

## 2017-03-20 DIAGNOSIS — I493 Ventricular premature depolarization: Secondary | ICD-10-CM

## 2017-03-20 DIAGNOSIS — I10 Essential (primary) hypertension: Secondary | ICD-10-CM

## 2017-03-21 LAB — BASIC METABOLIC PANEL
BUN / CREAT RATIO: 14 (ref 10–24)
BUN: 14 mg/dL (ref 8–27)
CHLORIDE: 101 mmol/L (ref 96–106)
CO2: 27 mmol/L (ref 18–29)
Calcium: 9.1 mg/dL (ref 8.6–10.2)
Creatinine, Ser: 0.99 mg/dL (ref 0.76–1.27)
GFR calc non Af Amer: 80 mL/min/{1.73_m2} (ref >59)
GFR, EST AFRICAN AMERICAN: 93 mL/min/{1.73_m2} (ref >59)
Glucose: 81 mg/dL (ref 65–99)
POTASSIUM: 3.9 mmol/L (ref 3.5–5.2)
Sodium: 142 mmol/L (ref 134–144)

## 2017-10-03 ENCOUNTER — Encounter (INDEPENDENT_AMBULATORY_CARE_PROVIDER_SITE_OTHER): Payer: Self-pay | Admitting: Orthopedic Surgery

## 2017-10-03 ENCOUNTER — Ambulatory Visit (INDEPENDENT_AMBULATORY_CARE_PROVIDER_SITE_OTHER): Payer: Medicare HMO

## 2017-10-03 ENCOUNTER — Ambulatory Visit (INDEPENDENT_AMBULATORY_CARE_PROVIDER_SITE_OTHER): Payer: Self-pay | Admitting: Orthopedic Surgery

## 2017-10-03 DIAGNOSIS — M79672 Pain in left foot: Secondary | ICD-10-CM | POA: Diagnosis not present

## 2017-10-07 NOTE — Progress Notes (Signed)
Office Visit Note   Patient: Brian Parker           Date of Birth: 1950-06-08           MRN: [id number] Visit Date: 10/03/2017 Requested by: Hulan Fess, MD [place], [place] [address] PCP: Hulan Fess, MD  Subjective: Chief Complaint  Patient presents with  . Left Foot - Pain    HPI: Brian Parker is a 67 year old patient with left foot and ankle pain.  He was hit by a softball medial aspect 2 months ago.  Really getting too much better.  He in place with an ASO which helps some.  He has good and bad days.  Developed bruising after the injury.  The ball did hit him on the fly from about 75-100 yards after impact.  Pain will radiate across the top of the foot.  He was able to keep playing.  He has a golf trip next week.              ROS: All systems reviewed are negative as they relate to the chief complaint within the history of present illness.  Patient denies  fevers or chills.   Assessment & Plan: Visit Diagnoses:  1. Pain in left foot     Plan: Impression is probable nondisplaced impact navicular fracture with no loss of function of the posterior tib tendon.  Fractures long since healed but some residual pain is not unexpected in this particular case.  I think the brace he has is sufficient to offer support to the midfoot during this phase of healing and rehabilitation.  No further intervention or imaging required at this time.  WITH me as needed  Follow-Up Instructions: Return if symptoms worsen or fail to improve.   Orders:  Orders Placed This Encounter  Procedures  . XR Foot Complete Left   No orders of the defined types were placed in this encounter.     Procedures: No procedures performed   Clinical Data: No additional findings.  Objective: Vital Signs: There were no vitals taken for this visit.  Physical Exam:   Constitutional: Patient appears well-developed HEENT:  Head: Normocephalic Eyes:EOM are normal Neck: Normal range of  motion Cardiovascular: Normal rate Pulmonary/chest: Effort normal Neurologic: Patient is alert Skin: Skin is warm Psychiatric: Patient has normal mood and affect    Ortho Exam: Orthopedic exam demonstrates normal gait and alignment.  There is some tenderness around the navicular but he has excellent inversion strength of both feet.  Has palpable intact nontender anterior to posterior tib peroneal and Achilles tendons.  No other masses lymph adenopathy or skin changes noted in the left foot region.  No real pain with pronation supination of the forefoot or with subtalar motion.  Appropriate heel inversion when he stands on his toes.  Specialty Comments:  No specialty comments available.  Imaging: No results found.   PMFS History: Patient Active Problem List   Diagnosis Date Noted  . PVC (premature ventricular contraction) 02/26/2017  . History of B-cell lymphoma 12/05/2015  . History of skin cancer 12/05/2015  . Preventive measure 12/06/2014  . Elevated prostate specific antigen (PSA) 11/20/2011  . Essential hypertension 11/09/2011  . History of kidney cancer 03/01/2003   Past Medical History:  Diagnosis Date  . Cancer of kidney, secondary ([place]) 01/06/2010   right kidney//right kidney removed//left kidney functions WDL  . Chest pain    INTERMITTENT/15 years ago//stress induced  . Edema of lower extremity   .  Elevated prostate specific antigen (PSA) 11/20/2011  . Follicular lymphoma grade I of intra-abdominal lymph nodes ([place]) 12/31/2006  . History of B-cell lymphoma 12/05/2015  . Hypertension   . Kidney carcinoma ([place]) 03/01/2003   tumor seperate from lymphoma; found during routine screening for lymphoma  . NHL (non-Hodgkin's lymphoma) ([place]) 12/06/2014  . Nodular lymphoma involving intra-abdominal lymph nodes ([place]) 12/20/2011  . Non Hodgkin's lymphoma ([place])     Family History  Problem Relation Age of Onset  . Uterine cancer Mother   . Cervical cancer Mother   . Coronary  artery disease Father   . Hypertension Father     Past Surgical History:  Procedure Laterality Date  . APPENDECTOMY    . CARDIOVASCULAR STRESS TEST  10/19/2008   EF 52%, NO ISCHEMIA  . GROIN MASS OPEN BIOPSY    . HERNIA REPAIR  2006-2007   right laparoscopic  . HERNIA REPAIR  2006-2007   left laparoscopic  . NEPHRECTOMY     right kidney  . SHOULDER SURGERY    . TOTAL KNEE ARTHROPLASTY Left 03/25/2013   Dr Marlou Sa  . TOTAL KNEE ARTHROPLASTY Left 03/24/2013   Procedure: LEFT TOTAL KNEE ARTHROPLASTY;  Surgeon: Meredith Pel, MD;  Location: [facility];  Service: Orthopedics;  Laterality: Left;  Left Total Knee Arthroplasty  . US ECHOCARDIOGRAPHY  02/26/2006   EF 55-60%  . VENTRAL HERNIA REPAIR  2003-2004   with mesh after biopsy   Social History   Occupational History  . Not on file.   Social History Main Topics  . Smoking status: Never Smoker  . Smokeless tobacco: Never Used  . Alcohol use 4.2 oz/week    6 Glasses of wine, 1 Cans of beer per week     Comment: social  . Drug use: No  . Sexual activity: Not on file

## 2017-10-14 ENCOUNTER — Other Ambulatory Visit: Payer: Self-pay | Admitting: Cardiovascular Disease

## 2017-10-14 DIAGNOSIS — Z79899 Other long term (current) drug therapy: Secondary | ICD-10-CM

## 2017-12-05 ENCOUNTER — Ambulatory Visit: Payer: Medicare HMO | Admitting: Hematology and Oncology

## 2017-12-05 ENCOUNTER — Encounter: Payer: Self-pay | Admitting: Hematology and Oncology

## 2017-12-05 ENCOUNTER — Telehealth: Payer: Self-pay | Admitting: Hematology and Oncology

## 2017-12-05 ENCOUNTER — Other Ambulatory Visit (HOSPITAL_BASED_OUTPATIENT_CLINIC_OR_DEPARTMENT_OTHER): Payer: Medicare HMO

## 2017-12-05 VITALS — BP 151/90 | HR 62 | Temp 98.1°F | Resp 17 | Ht 70.0 in | Wt 215.1 lb

## 2017-12-05 DIAGNOSIS — Z23 Encounter for immunization: Secondary | ICD-10-CM | POA: Diagnosis not present

## 2017-12-05 DIAGNOSIS — Z85828 Personal history of other malignant neoplasm of skin: Secondary | ICD-10-CM

## 2017-12-05 DIAGNOSIS — R970 Elevated carcinoembryonic antigen [CEA]: Secondary | ICD-10-CM

## 2017-12-05 DIAGNOSIS — Z85528 Personal history of other malignant neoplasm of kidney: Secondary | ICD-10-CM

## 2017-12-05 DIAGNOSIS — Z8572 Personal history of non-Hodgkin lymphomas: Secondary | ICD-10-CM

## 2017-12-05 DIAGNOSIS — Z299 Encounter for prophylactic measures, unspecified: Secondary | ICD-10-CM

## 2017-12-05 DIAGNOSIS — R972 Elevated prostate specific antigen [PSA]: Secondary | ICD-10-CM

## 2017-12-05 DIAGNOSIS — R1031 Right lower quadrant pain: Secondary | ICD-10-CM

## 2017-12-05 LAB — COMPREHENSIVE METABOLIC PANEL
ALT: 18 U/L (ref 0–55)
ANION GAP: 11 meq/L (ref 3–11)
AST: 20 U/L (ref 5–34)
Albumin: 4.4 g/dL (ref 3.5–5.0)
Alkaline Phosphatase: 71 U/L (ref 40–150)
BUN: 16.9 mg/dL (ref 7.0–26.0)
CHLORIDE: 103 meq/L (ref 98–109)
CO2: 27 mEq/L (ref 22–29)
Calcium: 9.3 mg/dL (ref 8.4–10.4)
Creatinine: 1.1 mg/dL (ref 0.7–1.3)
GLUCOSE: 84 mg/dL (ref 70–140)
POTASSIUM: 3.9 meq/L (ref 3.5–5.1)
SODIUM: 141 meq/L (ref 136–145)
Total Bilirubin: 0.75 mg/dL (ref 0.20–1.20)
Total Protein: 7 g/dL (ref 6.4–8.3)

## 2017-12-05 LAB — CBC WITH DIFFERENTIAL/PLATELET
BASO%: 0.2 % (ref 0.0–2.0)
BASOS ABS: 0 10*3/uL (ref 0.0–0.1)
EOS ABS: 0.1 10*3/uL (ref 0.0–0.5)
EOS%: 1.6 % (ref 0.0–7.0)
HEMATOCRIT: 47.9 % (ref 38.4–49.9)
HEMOGLOBIN: 16.3 g/dL (ref 13.0–17.1)
LYMPH#: 1.1 10*3/uL (ref 0.9–3.3)
LYMPH%: 19.9 % (ref 14.0–49.0)
MCH: 31.1 pg (ref 27.2–33.4)
MCHC: 34 g/dL (ref 32.0–36.0)
MCV: 91.4 fL (ref 79.3–98.0)
MONO#: 0.3 10*3/uL (ref 0.1–0.9)
MONO%: 5.6 % (ref 0.0–14.0)
NEUT#: 4.1 10*3/uL (ref 1.5–6.5)
NEUT%: 72.7 % (ref 39.0–75.0)
PLATELETS: 162 10*3/uL (ref 140–400)
RBC: 5.24 10*6/uL (ref 4.20–5.82)
RDW: 13.2 % (ref 11.0–14.6)
WBC: 5.6 10*3/uL (ref 4.0–10.3)

## 2017-12-05 LAB — LACTATE DEHYDROGENASE: LDH: 220 U/L (ref 125–245)

## 2017-12-05 MED ORDER — INFLUENZA VAC SPLIT QUAD 0.5 ML IM SUSY
0.5000 mL | PREFILLED_SYRINGE | Freq: Once | INTRAMUSCULAR | Status: AC
  Administered 2017-12-05: 0.5 mL via INTRAMUSCULAR
  Filled 2017-12-05: qty 0.5

## 2017-12-05 NOTE — Assessment & Plan Note (Signed)
He has no suspicious moles today. I recommend minimum annual skin check by dermatologist

## 2017-12-05 NOTE — Assessment & Plan Note (Signed)
We discussed the importance of preventive care and reviewed the vaccination programs. He does not have any prior allergic reactions to influenza vaccination. He agrees to proceed with influenza vaccination today and we will administer it today at the clinic.  

## 2017-12-05 NOTE — Progress Notes (Signed)
[facility] OFFICE PROGRESS NOTE  Patient Care Team: Hulan Fess, MD as PCP - General (Family Medicine) Arloa Koh, MD as Consulting Physician (Radiation Oncology) Kathie Rhodes, MD as Consulting Physician (Urology) Heath Lark, MD as Consulting Physician (Hematology and Oncology)  SUMMARY OF ONCOLOGIC HISTORY:  I reviewed the patient's records extensive and collaborated the history with the patient. Summary of his history is as follows: He was initially diagnosed with a follicular grade 1,  B-cell, non-Hodgkin's lymphoma in 2003 . He has also been diagnosed with metachronous primary tumors of his right kidney and ultimately underwent initial partial nephrectomy in March 2004 then a completion nephrectomy in January 2011. With regard to his lymphoma, he was treated with 8 cycles of CHOP/Rituxan through July 2003. He was started on maintenance rituximab but only had 2 cycles due to development of delayed neutropenia.  There was suspicion for early progression on a CT scan done in July of 2009 which showed a single area of soft tissue density in the left periaortic region. This area remained stable on serial scans until a study done on 11/13/2011 which did show clear regrowth in this area enlarging from 2.6 x 0.9 cm to approximately 4.9 x 3.4 cm. A PET scan was done on 11/30/2011 which showed a single area of markedly abnormal activity with SUV up to 10.3 over the abnormal area of lymphadenopathy seen on CT scan. There were no other areas of activity.  Given this fact, he was referred to radiation oncologist, seen by Dr. Arloa Koh and received 3000 cGy in 15 fractions to the left para-aortic lymph node mass between January 16 and 02/04/2012. He tolerated treatment well.  A post treatment CT scan done 04/29/2012 shown a further reduction down to almost imperceptible amount of soft tissue in the periaortic area.  Last CT scan in 2015 showed no evidence of cancer recurrence He  follows closely with urologist for mildly elevated PSA  INTERVAL HISTORY: Please see below for problem oriented charting. He returns for further follow-up He denies recent infection No recent urinary symptoms  He has seen dermatologist recently with no signs of skin cancer recurrence However, he complained of vague abdominal pain that comes and goes He denies nausea, vomiting or changes in bowel habits No new lymphadenopathy  REVIEW OF SYSTEMS:   Constitutional: Denies fevers, chills or abnormal weight loss Eyes: Denies blurriness of vision Ears, nose, mouth, throat, and face: Denies mucositis or sore throat Respiratory: Denies cough, dyspnea or wheezes Cardiovascular: Denies palpitation, chest discomfort or lower extremity swelling Gastrointestinal:  Denies nausea, heartburn or change in bowel habits Skin: Denies abnormal skin rashes Lymphatics: Denies new lymphadenopathy or easy bruising Neurological:Denies numbness, tingling or new weaknesses Behavioral/Psych: Mood is stable, no new changes  All other systems were reviewed with the patient and are negative.  I have reviewed the past medical history, past surgical history, social history and family history with the patient and they are unchanged from previous note.  ALLERGIES:  is allergic to losartan.  MEDICATIONS:  Current Outpatient Medications  Medication Sig Dispense Refill  . amLODipine (NORVASC) 5 MG tablet TAKE 1 TABLET(5 MG) BY MOUTH DAILY 30 tablet 6  . aspirin EC 81 MG tablet Take 81 mg by mouth daily.      [name] doxazosin (CARDURA) 4 MG tablet TAKE 1/2 TABLET BY MOUTH EVERY NIGHT AT BEDTIME 45 tablet 2  . Fish Oil-Krill Oil CAPS Take 1 capsule by mouth daily.    . hydrochlorothiazide (HYDRODIURIL) 25  MG tablet Take 1 tablet (25 mg total) by mouth daily. 90 tablet 3  . lisinopril (PRINIVIL,ZESTRIL) 10 MG tablet TAKE 1 TABLET(10 MG) BY MOUTH DAILY 90 tablet 1  . Multiple Vitamin (MULTIVITAMIN) tablet Take 1 tablet by  mouth daily.      . potassium chloride SA (K-DUR,KLOR-CON) 20 MEQ tablet Take 1 tablet (20 mEq total) by mouth daily. 90 tablet 3   No current facility-administered medications for this visit.     PHYSICAL EXAMINATION: ECOG PERFORMANCE STATUS: 1 - Symptomatic but completely ambulatory  Vitals:   12/05/17 1045  BP: (!) 151/90  Pulse: 62  Resp: 17  Temp: 98.1 F (36.7 C)  SpO2: 99%   Filed Weights   12/05/17 1045  Weight: 215 lb 1.6 oz (97.6 kg)    GENERAL:alert, no distress and comfortable SKIN: skin color, texture, turgor are normal, no rashes or significant lesions EYES: normal, Conjunctiva are pink and non-injected, sclera clear OROPHARYNX:no exudate, no erythema and lips, buccal mucosa, and tongue normal  NECK: supple, thyroid normal size, non-tender, without nodularity LYMPH:  no palpable lymphadenopathy in the cervical, axillary or inguinal LUNGS: clear to auscultation and percussion with normal breathing effort HEART: regular rate & rhythm and no murmurs and no lower extremity edema ABDOMEN:abdomen soft, non-tender and normal bowel sounds Musculoskeletal:no cyanosis of digits and no clubbing  NEURO: alert & oriented x 3 with fluent speech, no focal motor/sensory deficits  LABORATORY DATA:  I have reviewed the data as listed    Component Value Date/Time   NA 141 12/05/2017 1013   K 3.9 12/05/2017 1013   CL 101 03/20/2017 1452   CL 105 03/02/2013 0818   CO2 27 12/05/2017 1013   GLUCOSE 84 12/05/2017 1013   GLUCOSE 88 03/02/2013 0818   BUN 16.9 12/05/2017 1013   CREATININE 1.1 12/05/2017 1013   CALCIUM 9.3 12/05/2017 1013   PROT 7.0 12/05/2017 1013   ALBUMIN 4.4 12/05/2017 1013   AST 20 12/05/2017 1013   ALT 18 12/05/2017 1013   ALKPHOS 71 12/05/2017 1013   BILITOT 0.75 12/05/2017 1013   GFRNONAA 80 03/20/2017 1452   GFRAA 93 03/20/2017 1452    No results found for: SPEP, UPEP  Lab Results  Component Value Date   WBC 5.6 12/05/2017   NEUTROABS 4.1  12/05/2017   HGB 16.3 12/05/2017   HCT 47.9 12/05/2017   MCV 91.4 12/05/2017   PLT 162 12/05/2017      Chemistry      Component Value Date/Time   NA 141 12/05/2017 1013   K 3.9 12/05/2017 1013   CL 101 03/20/2017 1452   CL 105 03/02/2013 0818   CO2 27 12/05/2017 1013   BUN 16.9 12/05/2017 1013   CREATININE 1.1 12/05/2017 1013      Component Value Date/Time   CALCIUM 9.3 12/05/2017 1013   ALKPHOS 71 12/05/2017 1013   AST 20 12/05/2017 1013   ALT 18 12/05/2017 1013   BILITOT 0.75 12/05/2017 1013     ASSESSMENT & PLAN:  History of kidney cancer He denies recent hematuria However, he had vague abdominal pain Recommend CT scan of the abdomen and pelvis without contrast to exclude cancer recurrence and he agreed to proceed  History of B-cell lymphoma Clinically, he has no palpable lymphadenopathy.  He is at high risk of recurrence However, he had vague abdominal pain Recommend CT scan of the abdomen and pelvis without contrast to exclude cancer recurrence and he agreed to proceed   History of  skin cancer He has no suspicious moles today. I recommend minimum annual skin check by dermatologist  Elevated prostate specific antigen (PSA) He will continue close follow-up with urologist.  Preventive measure We discussed the importance of preventive care and reviewed the vaccination programs. He does not have any prior allergic reactions to influenza vaccination. He agrees to proceed with influenza vaccination today and we will administer it today at the clinic.    Orders Placed This Encounter  Procedures  . CT Abdomen Pelvis Wo Contrast    Standing Status:   Future    Standing Expiration Date:   12/05/2018    Order Specific Question:   Preferred imaging location?    Answer:   [facility]    Order Specific Question:   Radiology Contrast Protocol - do NOT remove file path    Answer:   [email or web address]   All questions were answered.  The patient knows to call the clinic with any problems, questions or concerns. No barriers to learning was detected. I spent 20 minutes counseling the patient face to face. The total time spent in the appointment was 25 minutes and more than 50% was on counseling and review of test results     Heath Lark, MD 12/05/2017 12:04 PM

## 2017-12-05 NOTE — Assessment & Plan Note (Signed)
He denies recent hematuria However, he had vague abdominal pain Recommend CT scan of the abdomen and pelvis without contrast to exclude cancer recurrence and he agreed to proceed

## 2017-12-05 NOTE — Assessment & Plan Note (Signed)
Clinically, he has no palpable lymphadenopathy.  He is at high risk of recurrence However, he had vague abdominal pain Recommend CT scan of the abdomen and pelvis without contrast to exclude cancer recurrence and he agreed to proceed

## 2017-12-05 NOTE — Telephone Encounter (Signed)
Gave avs and calendar for December 2019 °

## 2017-12-05 NOTE — Assessment & Plan Note (Signed)
He will continue close follow-up with urologist.

## 2017-12-10 ENCOUNTER — Telehealth: Payer: Self-pay | Admitting: Hematology and Oncology

## 2017-12-10 ENCOUNTER — Encounter (HOSPITAL_COMMUNITY): Payer: Self-pay

## 2017-12-10 ENCOUNTER — Ambulatory Visit (HOSPITAL_COMMUNITY)
Admission: RE | Admit: 2017-12-10 | Discharge: 2017-12-10 | Disposition: A | Discharge status: Home / Self Care | Payer: Medicare HMO | Source: Ambulatory Visit | Attending: Hematology and Oncology | Admitting: Hematology and Oncology

## 2017-12-10 DIAGNOSIS — C649 Malignant neoplasm of unspecified kidney, except renal pelvis: Secondary | ICD-10-CM | POA: Insufficient documentation

## 2017-12-10 DIAGNOSIS — Z8572 Personal history of non-Hodgkin lymphomas: Secondary | ICD-10-CM | POA: Insufficient documentation

## 2017-12-10 DIAGNOSIS — Z905 Acquired absence of kidney: Secondary | ICD-10-CM | POA: Insufficient documentation

## 2017-12-10 DIAGNOSIS — R1031 Right lower quadrant pain: Secondary | ICD-10-CM

## 2017-12-10 DIAGNOSIS — Z85528 Personal history of other malignant neoplasm of kidney: Secondary | ICD-10-CM

## 2017-12-10 NOTE — Telephone Encounter (Signed)
I have reviewed imaging study with the patient He has no signs of cancer recurrence His abdominal discomfort likely related to prior surgery Incidentally, his prostate appears enlarged He has appointment to see urologist next week I have addressed all his questions and plan to see him next year

## 2017-12-12 DIAGNOSIS — R972 Elevated prostate specific antigen [PSA]: Secondary | ICD-10-CM | POA: Diagnosis not present

## 2017-12-30 DIAGNOSIS — J029 Acute pharyngitis, unspecified: Secondary | ICD-10-CM | POA: Diagnosis not present

## 2018-01-08 DIAGNOSIS — R3915 Urgency of urination: Secondary | ICD-10-CM | POA: Diagnosis not present

## 2018-01-08 DIAGNOSIS — N401 Enlarged prostate with lower urinary tract symptoms: Secondary | ICD-10-CM | POA: Diagnosis not present

## 2018-01-08 DIAGNOSIS — R972 Elevated prostate specific antigen [PSA]: Secondary | ICD-10-CM | POA: Diagnosis not present

## 2018-01-09 ENCOUNTER — Other Ambulatory Visit: Payer: Self-pay | Admitting: Cardiovascular Disease

## 2018-01-24 ENCOUNTER — Telehealth: Payer: Self-pay | Admitting: Cardiovascular Disease

## 2018-01-24 DIAGNOSIS — I10 Essential (primary) hypertension: Secondary | ICD-10-CM

## 2018-01-24 DIAGNOSIS — I493 Ventricular premature depolarization: Secondary | ICD-10-CM

## 2018-01-24 MED ORDER — PROPRANOLOL HCL 10 MG PO TABS: 10.0000 mg | ORAL_TABLET | Freq: Four times a day (QID) | ORAL | 3 refills | Status: DC | PRN

## 2018-01-24 MED ORDER — AMLODIPINE BESYLATE 5 MG PO TABS: 5.0000 mg | ORAL_TABLET | Freq: Every day | ORAL | 3 refills | Status: DC

## 2018-01-24 NOTE — Telephone Encounter (Signed)
Reviewed Dr. Elmarie Shiley advice with patient. He states he would like to try propranolol 10 mg QID PRN. He is aware that if he experiences low BP or HR and/or dizziness or light-headedness to call back to report and we can decrease one of his other BP medications. I scheduled him for lab work on Thursday 1/31. I advised him to call back in a week or 10 days to let us know how he is feeling. He verbalized understanding and agreement with plan and thanked me for my help.

## 2018-01-24 NOTE — Telephone Encounter (Signed)
Patient calling,  Patient states that at his last visit with Dr.Nahser he was instructed to call back if he experienced PVC. Patient states that it's starting to happen more frequently now.

## 2018-01-24 NOTE — Addendum Note (Signed)
Addended by: Emmaline Life on: 01/24/2018 03:59 PM   Modules accepted: Orders

## 2018-01-24 NOTE — Telephone Encounter (Signed)
Spoke with patient who states he thinks he is having more PVCs recently. He states he has intermittent "rumbling in my chest" that seems to occur more often when he is stressed. States he does not notice it more often when laying down. At his last ov on 02/26/17 he was advised to decrease caffeine intake which he states he has done significantly and he has increased his K+ intake as advised. He states he discussed testing with Dr. Acie Fredrickson at the last ov and decided not to pursue having any tests done because he was paying a very high insurance premium at the time. He states he now has better insurance and would like to know what Dr. Acie Fredrickson recommends. I advised that he may want to order an echo and/or a 30 day monitor. I advised I will forward the message to Dr. Acie Fredrickson and will call him back later with his advice. Patient verbalized understanding and agreement and thanked me for the call.

## 2018-01-24 NOTE — Telephone Encounter (Signed)
Lets tryToprol XL 25 mg a day  If he gets dizzy, he can cut back on the Cardura   Alternatively, we can go with Propranolol 10 mg QID PRN.

## 2018-01-29 ENCOUNTER — Other Ambulatory Visit: Payer: Medicare HMO | Admitting: *Deleted

## 2018-01-29 DIAGNOSIS — I493 Ventricular premature depolarization: Secondary | ICD-10-CM

## 2018-01-29 DIAGNOSIS — I10 Essential (primary) hypertension: Secondary | ICD-10-CM | POA: Diagnosis not present

## 2018-01-29 LAB — TSH: TSH: 1.5 u[IU]/mL (ref 0.450–4.500)

## 2018-01-29 LAB — BASIC METABOLIC PANEL
BUN/Creatinine Ratio: 16 (ref 10–24)
BUN: 16 mg/dL (ref 8–27)
CALCIUM: 9.2 mg/dL (ref 8.6–10.2)
CO2: 24 mmol/L (ref 20–29)
Chloride: 101 mmol/L (ref 96–106)
Creatinine, Ser: 1 mg/dL (ref 0.76–1.27)
GFR calc Af Amer: 91 mL/min/{1.73_m2} (ref >59)
GFR, EST NON AFRICAN AMERICAN: 79 mL/min/{1.73_m2} (ref >59)
Glucose: 109 mg/dL — ABNORMAL HIGH (ref 65–99)
POTASSIUM: 4.1 mmol/L (ref 3.5–5.2)
Sodium: 141 mmol/L (ref 134–144)

## 2018-01-30 ENCOUNTER — Other Ambulatory Visit: Payer: Medicare HMO

## 2018-03-21 ENCOUNTER — Other Ambulatory Visit: Payer: Self-pay | Admitting: Cardiovascular Disease

## 2018-03-24 NOTE — Telephone Encounter (Signed)
Patient calling, states that he is returning call

## 2018-03-24 NOTE — Telephone Encounter (Signed)
Left message for patient to call back regarding refill request Patient needs a 1 yr ov with Dr. Acie Fredrickson

## 2018-03-28 ENCOUNTER — Encounter: Payer: Self-pay | Admitting: *Deleted

## 2018-03-31 ENCOUNTER — Telehealth: Payer: Self-pay | Admitting: *Deleted

## 2018-03-31 ENCOUNTER — Ambulatory Visit: Payer: Medicare HMO | Admitting: Cardiovascular Disease

## 2018-03-31 ENCOUNTER — Encounter: Payer: Self-pay | Admitting: Cardiovascular Disease

## 2018-03-31 VITALS — BP 158/82 | HR 60 | Ht 70.0 in | Wt 217.1 lb

## 2018-03-31 DIAGNOSIS — R0683 Snoring: Secondary | ICD-10-CM

## 2018-03-31 DIAGNOSIS — I10 Essential (primary) hypertension: Secondary | ICD-10-CM | POA: Diagnosis not present

## 2018-03-31 MED ORDER — PROPRANOLOL HCL 10 MG PO TABS: 10.0000 mg | ORAL_TABLET | Freq: Four times a day (QID) | ORAL | 3 refills | Status: DC | PRN

## 2018-03-31 MED ORDER — HYDROCHLOROTHIAZIDE 25 MG PO TABS: 25.0000 mg | ORAL_TABLET | Freq: Every day | ORAL | 3 refills | Status: DC

## 2018-03-31 MED ORDER — POTASSIUM CHLORIDE CRYS ER 20 MEQ PO TBCR: 20.0000 meq | EXTENDED_RELEASE_TABLET | Freq: Every day | ORAL | 3 refills | Status: DC

## 2018-03-31 MED ORDER — DOXAZOSIN MESYLATE 2 MG PO TABS: 2.0000 mg | ORAL_TABLET | Freq: Every day | ORAL | 3 refills | Status: DC

## 2018-03-31 MED ORDER — LISINOPRIL 20 MG PO TABS: 20.0000 mg | ORAL_TABLET | Freq: Every day | ORAL | 3 refills | Status: DC

## 2018-03-31 MED ORDER — AMLODIPINE BESYLATE 5 MG PO TABS: 5.0000 mg | ORAL_TABLET | Freq: Every day | ORAL | 3 refills | Status: DC

## 2018-03-31 NOTE — Patient Instructions (Signed)
Medication Instructions:  Your physician has recommended you make the following change in your medication:   INCREASE Lisinopril to 20 mg once daily   Labwork: Your physician recommends that you return for lab work in: 3 weeks for basic metabolic panel   Testing/Procedures: Your physician has recommended that you have a sleep study. This test records several body functions during sleep, including: brain activity, eye movement, oxygen and carbon dioxide blood levels, heart rate and rhythm, breathing rate and rhythm, the flow of air through your mouth and nose, snoring, body muscle movements, and chest and belly movement.    Follow-Up: Your physician wants you to follow-up in: 6 months with Dr. Acie Fredrickson.  You will receive a reminder letter in the mail two months in advance. If you don't receive a letter, please call our office to schedule the follow-up appointment.   If you need a refill on your cardiac medications before your next appointment, please call your pharmacy.   Thank you for choosing [facility]! Christen Bame, RN [phone]

## 2018-03-31 NOTE — Telephone Encounter (Signed)
Called and left sleep study appointment with Brian Parker contact information in case he has questions or needs to reschedule the appointment.

## 2018-03-31 NOTE — Progress Notes (Signed)
Brian Parker Date of Birth  12-23-1950 [place] [address]    [place], [place]  [address]    [place], [place]  [address] [phone]  Fax  [phone]  [phone]  Fax [phone]  1. Hypertension 2. Non Hodgkins Lymphoma   [name] has done well from a cardiac standpoint.  He recently had XRT for a growing lymph node in his abdomen.   His BP has been well controlled at home.    January 29, 2013: He continues to have issues with his non-hodgkins lymphoma.  He had XTR and has been declared in remission .  He had a  Left leg puncture would from a drill bit - never really healed well.  He recently had this would debrided and is scheduled to have a left knee replacement once this has healed.  He has not been able to exercise because of these leg issues.   Nov. 13, 2014: Brian Parker is doing well. His BP has been well controlled.  His lymphoma is still in remission.  Playing golf regularly.   May 19, 2014: Brian Parker is doing well.  Playing some golf.   BP has been well controlled.    He has been playing golf in the evenings ( walks 9 holes).   He is slowing losing wome weight.    June 27, 2015:   Brian Parker is doing well.  No CP .  Playing golf regularly .  Has been tracking what he eats .  Has been measuring his BP  several times a day   Sept. 1, 2017:  Doing well. Just got back from [place] playing golf.   Played with Brian Parker.  Is active.  Also went to [place] and [place].   Walking regularly .  Lymphoma appears to be under good control   Feb. 27, 2018 Doing well Has felt some palpitations Occurs several times ( 15-20 times this AM already )  Not related to activity Seems to be worse when he is sitting  Not associated with CP or dyspnea or pre-syncope.  Went to the ER , was found to have PVCs  Potassium was low. Sleeping well, more caffeine recently  Walks regularly  - walked 18 holes of golf on  Sunday , no issues with walking   03/31/18  Feeling well. BP is a bit higher today . Perhaps ate more salt than he should  No CP or dyspnea.  Has occasional Palpitations .  Has propranolol - does not take it  Snores,   Asked about OSA   Current Outpatient Medications on File Prior to Visit  Medication Sig Dispense Refill  . amLODipine (NORVASC) 5 MG tablet Take 1 tablet (5 mg total) by mouth daily. 90 tablet 3  . aspirin EC 81 MG tablet Take 81 mg by mouth daily.      Brian Parker doxazosin (CARDURA) 4 MG tablet TAKE 1/2 TABLET BY MOUTH EVERY NIGHT AT BEDTIME 45 tablet 0  . Fish Oil-Krill Oil CAPS Take 1 capsule by mouth daily.    . hydrochlorothiazide (HYDRODIURIL) 25 MG tablet Take 1 tablet (25 mg total) by mouth daily. 30 tablet 0  . lisinopril (PRINIVIL,ZESTRIL) 10 MG tablet TAKE 1 TABLET(10 MG) BY MOUTH DAILY 90 tablet 1  . Multiple Vitamin (MULTIVITAMIN) tablet Take 1 tablet by mouth daily.      . potassium chloride SA (K-DUR,KLOR-CON) 20 MEQ tablet  Take 1 tablet (20 mEq total) by mouth daily. 90 tablet 3  . propranolol (INDERAL) 10 MG tablet Take 1 tablet (10 mg total) by mouth 4 (four) times daily as needed (palpitations or fast heart rate). 90 tablet 3  . tamsulosin (FLOMAX) 0.4 MG CAPS capsule Take 1 capsule by mouth 2 (two) times a week.     No current facility-administered medications on file prior to visit.   Amlodipine 5 mg a day   Allergies  Allergen Reactions  . Losartan     Stomach cramps.    Past Medical History:  Diagnosis Date  . Cancer of kidney, secondary ([facility]) 01/06/2010   right kidney//right kidney removed//left kidney functions WDL  . Chest pain    INTERMITTENT/15 years ago//stress induced  . Edema of lower extremity   . Elevated prostate specific antigen (PSA) 11/20/2011  . Follicular lymphoma grade I of intra-abdominal lymph nodes ([facility]) 12/31/2006  . History of B-cell lymphoma 12/05/2015  . Hypertension   . Kidney carcinoma ([facility]) 03/01/2003   tumor seperate from  lymphoma; found during routine screening for lymphoma  . NHL (non-Hodgkin's lymphoma) ([facility]) 12/06/2014  . Nodular lymphoma involving intra-abdominal lymph nodes ([facility]) 12/20/2011  . Non Hodgkin's lymphoma [facility])     Past Surgical History:  Procedure Laterality Date  . APPENDECTOMY    . CARDIOVASCULAR STRESS TEST  10/19/2008   EF 52%, NO ISCHEMIA  . GROIN MASS OPEN BIOPSY    . HERNIA REPAIR  2006-2007   right laparoscopic  . HERNIA REPAIR  2006-2007   left laparoscopic  . NEPHRECTOMY     right kidney  . SHOULDER SURGERY    . TOTAL KNEE ARTHROPLASTY Left 03/25/2013   Dr Marlou Sa  . TOTAL KNEE ARTHROPLASTY Left 03/24/2013   Procedure: LEFT TOTAL KNEE ARTHROPLASTY;  Surgeon: Meredith Pel, MD;  Location: [facility];  Service: Orthopedics;  Laterality: Left;  Left Total Knee Arthroplasty  . US ECHOCARDIOGRAPHY  02/26/2006   EF 55-60%  . VENTRAL HERNIA REPAIR  2003-2004   with mesh after biopsy    Social History   Tobacco Use  Smoking Status Never Smoker  Smokeless Tobacco Never Used    Social History   Substance and Sexual Activity  Alcohol Use Yes  . Alcohol/week: 4.2 oz  . Types: 6 Glasses of wine, 1 Cans of beer per week   Comment: social    Family History  Problem Relation Age of Onset  . Uterine cancer Mother   . Cervical cancer Mother   . Coronary artery disease Father   . Hypertension Father     Reviw of Systems:  Reviewed in the HPI.  All other systems are negative.   Physical Exam: Blood pressure (!) 158/82, pulse 60, height 5\' 10"  (1.778 m), weight 217 lb 1.9 oz (98.5 kg), SpO2 98 %.  GEN:  Well nourished, well developed in no acute distress HEENT: Normal NECK: No JVD; No carotid bruits LYMPHATICS: No lymphadenopathy CARDIAC: RRR   RESPIRATORY:  Clear to auscultation without rales, wheezing or rhonchi  ABDOMEN: Soft, non-tender, non-distended MUSCULOSKELETAL:  No edema; No deformity  SKIN: Warm and dry NEUROLOGIC:  Alert and oriented x 3 .  ECG::  March 31, 2018: Normal sinus rhythm at 60.  He has marked sinus arrhythmia.  Occasional premature ventricular contractions.  Nonspecific IVCD.  Assessment / Plan:   1. Hypertension:     Will increase Lisinopril to 20 mg a day .  Written script Needs to lose some weight  Advised more exercise  2. Premature ventricular contractions:   Benign   2. Non Hodgkins Lymphoma- stable        Mertie Moores, MD  03/31/2018 9:12 AM    [facility] [place],  [place], [place]  [address] Pager [phone] Phone: [phone]; Fax: [phone]

## 2018-04-12 ENCOUNTER — Encounter (HOSPITAL_BASED_OUTPATIENT_CLINIC_OR_DEPARTMENT_OTHER): Payer: Medicare HMO

## 2018-04-15 ENCOUNTER — Encounter (HOSPITAL_BASED_OUTPATIENT_CLINIC_OR_DEPARTMENT_OTHER): Payer: Medicare HMO

## 2018-04-21 ENCOUNTER — Other Ambulatory Visit: Payer: Medicare HMO | Admitting: *Deleted

## 2018-04-21 DIAGNOSIS — I10 Essential (primary) hypertension: Secondary | ICD-10-CM | POA: Diagnosis not present

## 2018-04-21 DIAGNOSIS — R0683 Snoring: Secondary | ICD-10-CM | POA: Diagnosis not present

## 2018-04-22 LAB — BASIC METABOLIC PANEL
BUN/Creatinine Ratio: 15 (ref 10–24)
BUN: 15 mg/dL (ref 8–27)
CALCIUM: 9 mg/dL (ref 8.6–10.2)
CHLORIDE: 102 mmol/L (ref 96–106)
CO2: 24 mmol/L (ref 20–29)
Creatinine, Ser: 0.98 mg/dL (ref 0.76–1.27)
GFR calc non Af Amer: 81 mL/min/{1.73_m2} (ref >59)
GFR, EST AFRICAN AMERICAN: 93 mL/min/{1.73_m2} (ref >59)
GLUCOSE: 115 mg/dL — AB (ref 65–99)
POTASSIUM: 3.7 mmol/L (ref 3.5–5.2)
Sodium: 141 mmol/L (ref 134–144)

## 2018-05-06 ENCOUNTER — Ambulatory Visit (HOSPITAL_BASED_OUTPATIENT_CLINIC_OR_DEPARTMENT_OTHER): Payer: Medicare HMO | Attending: Cardiovascular Disease | Admitting: Cardiology

## 2018-05-06 DIAGNOSIS — E669 Obesity, unspecified: Secondary | ICD-10-CM | POA: Diagnosis not present

## 2018-05-06 DIAGNOSIS — I493 Ventricular premature depolarization: Secondary | ICD-10-CM | POA: Insufficient documentation

## 2018-05-06 DIAGNOSIS — I10 Essential (primary) hypertension: Secondary | ICD-10-CM | POA: Diagnosis not present

## 2018-05-06 DIAGNOSIS — I491 Atrial premature depolarization: Secondary | ICD-10-CM | POA: Insufficient documentation

## 2018-05-06 DIAGNOSIS — Z6830 Body mass index (BMI) 30.0-30.9, adult: Secondary | ICD-10-CM | POA: Diagnosis not present

## 2018-05-06 DIAGNOSIS — R0683 Snoring: Secondary | ICD-10-CM | POA: Diagnosis not present

## 2018-05-11 NOTE — Procedures (Signed)
   Patient Name: Brian Parker, Brian Parker Date:12/17/2017 05/06/2018 Gender: Male D.O.B: January 30, 1950 Age (years): 53 Referring Provider: Mertie Moores Height (inches): 59 Interpreting Physician: Fransico Him MD, ABSM Weight (lbs): 210 RPSGT: Laren Everts BMI: 30 MRN: [id number] Neck Size: 16.00  CLINICAL INFORMATION Sleep Study Type: NPSG  Indication for sleep study: Hypertension, Obesity, Snoring  Epworth Sleepiness Score: 6  SLEEP STUDY TECHNIQUE As per the AASM Manual for the Scoring of Sleep and Associated Events v2.3 (April 2016) with a hypopnea requiring 4% desaturations.  The channels recorded and monitored were frontal, central and occipital EEG, electrooculogram (EOG), submentalis EMG (chin), nasal and oral airflow, thoracic and abdominal wall motion, anterior tibialis EMG, snore microphone, electrocardiogram, and pulse oximetry.  MEDICATIONS Medications self-administered by patient taken the night of the study : N/A  SLEEP ARCHITECTURE The study was initiated at 11:14:53 PM and ended at 5:25:13 AM.  Sleep onset time was 2.9 minutes and the sleep efficiency was 86.0%%. The total sleep time was 318.5 minutes.  Stage REM latency was 179.0 minutes.  The patient spent 11.9%% of the night in stage N1 sleep, 66.1%% in stage N2 sleep, 0.0%% in stage N3 and 21.98% in REM.  Alpha intrusion was absent.  Supine sleep was 42.39%.  RESPIRATORY PARAMETERS The overall apnea/hypopnea index (AHI) was 4.1 per hour. There were 6 total apneas, including 5 obstructive, 1 central and 0 mixed apneas. There were 16 hypopneas and 53 RERAs.  The AHI during Stage REM sleep was 7.7 per hour.  AHI while supine was 6.7 per hour.  The mean oxygen saturation was 92.9%. The minimum SpO2 during sleep was 88.0%.  moderate snoring was noted during this study.  CARDIAC DATA The 2 lead EKG demonstrated sinus rhythm. The mean heart rate was 50.6 beats per minute. Other EKG findings include: PVCs  and PACs.  LEG MOVEMENT DATA The total PLMS were 0 with a resulting PLMS index of 0.0. Associated arousal with leg movement index was 0.4 .  IMPRESSIONS - No significant obstructive sleep apnea occurred during this study (AHI = 4.1/h). - No significant central sleep apnea occurred during this study (CAI = 0.2/h). - The patient had minimal or no oxygen desaturation during the study (Min O2 = 88.0%) - The patient snored with moderate snoring volume. - EKG findings include PVCs and PACs. - Clinically significant periodic limb movements did not occur during sleep. No significant associated arousals.  DIAGNOSIS - Normal Study - PVCs - PACs  RECOMMENDATIONS - Avoid alcohol, sedatives and other CNS depressants that may worsen sleep apnea and disrupt normal sleep architecture. - Sleep hygiene should be reviewed to assess factors that may improve sleep quality. - Weight management and regular exercise should be initiated or continued if appropriate.  [Electronically signed] 05/11/2018 11:28 AM  Fransico Him MD, ABSM Diplomate, American Board of Sleep Medicine

## 2018-05-15 ENCOUNTER — Telehealth: Payer: Self-pay | Admitting: *Deleted

## 2018-05-15 NOTE — Telephone Encounter (Signed)
Informed patient of sleep study results and patient understanding was verbalized. Pt is aware and agreeable to normal results 

## 2018-05-15 NOTE — Telephone Encounter (Signed)
-----   Message from Sueanne Margarita, MD sent at 05/11/2018 11:31 AM EDT ----- Please let patient know that sleep study showed no significant sleep apnea.

## 2018-06-03 ENCOUNTER — Other Ambulatory Visit: Payer: Self-pay | Admitting: Cardiovascular Disease

## 2018-06-04 NOTE — Telephone Encounter (Signed)
Outpatient Medication Detail    Disp Refills Start End   hydrochlorothiazide (HYDRODIURIL) 25 MG tablet 90 tablet 3 03/31/2018    Sig - Route: Take 1 tablet (25 mg total) by mouth daily. - Oral   Sent to pharmacy as: hydrochlorothiazide (HYDRODIURIL) 25 MG tablet   E-Prescribing Status: Receipt confirmed by pharmacy (03/31/2018 9:33 AM EDT)   Pharmacy   [id number] - [place], [address] AT [address]

## 2018-06-11 ENCOUNTER — Other Ambulatory Visit: Payer: Self-pay | Admitting: Cardiovascular Disease

## 2018-06-11 MED ORDER — DOXAZOSIN MESYLATE 2 MG PO TABS: 2.0000 mg | ORAL_TABLET | Freq: Every day | ORAL | 3 refills | Status: DC

## 2018-06-23 ENCOUNTER — Other Ambulatory Visit: Payer: Self-pay | Admitting: *Deleted

## 2018-06-23 MED ORDER — HYDROCHLOROTHIAZIDE 25 MG PO TABS: 25.0000 mg | ORAL_TABLET | Freq: Every day | ORAL | 2 refills | Status: DC

## 2018-08-07 DIAGNOSIS — R972 Elevated prostate specific antigen [PSA]: Secondary | ICD-10-CM | POA: Diagnosis not present

## 2018-09-05 ENCOUNTER — Encounter: Payer: Self-pay | Admitting: Cardiovascular Disease

## 2018-09-16 DIAGNOSIS — Z833 Family history of diabetes mellitus: Secondary | ICD-10-CM | POA: Diagnosis not present

## 2018-09-16 DIAGNOSIS — I951 Orthostatic hypotension: Secondary | ICD-10-CM | POA: Diagnosis not present

## 2018-09-16 DIAGNOSIS — E669 Obesity, unspecified: Secondary | ICD-10-CM | POA: Diagnosis not present

## 2018-09-16 DIAGNOSIS — I10 Essential (primary) hypertension: Secondary | ICD-10-CM | POA: Diagnosis not present

## 2018-09-16 DIAGNOSIS — N400 Enlarged prostate without lower urinary tract symptoms: Secondary | ICD-10-CM | POA: Diagnosis not present

## 2018-09-16 DIAGNOSIS — Z8249 Family history of ischemic heart disease and other diseases of the circulatory system: Secondary | ICD-10-CM | POA: Diagnosis not present

## 2018-09-16 DIAGNOSIS — C8590 Non-Hodgkin lymphoma, unspecified, unspecified site: Secondary | ICD-10-CM | POA: Diagnosis not present

## 2018-09-22 ENCOUNTER — Encounter: Payer: Self-pay | Admitting: Cardiovascular Disease

## 2018-09-22 ENCOUNTER — Ambulatory Visit: Payer: Medicare HMO | Admitting: Cardiovascular Disease

## 2018-09-22 VITALS — BP 114/76 | HR 58 | Ht 70.0 in | Wt 215.1 lb

## 2018-09-22 DIAGNOSIS — I493 Ventricular premature depolarization: Secondary | ICD-10-CM

## 2018-09-22 DIAGNOSIS — I10 Essential (primary) hypertension: Secondary | ICD-10-CM | POA: Diagnosis not present

## 2018-09-22 DIAGNOSIS — R002 Palpitations: Secondary | ICD-10-CM

## 2018-09-22 LAB — BASIC METABOLIC PANEL
BUN / CREAT RATIO: 17 (ref 10–24)
BUN: 18 mg/dL (ref 8–27)
CHLORIDE: 103 mmol/L (ref 96–106)
CO2: 25 mmol/L (ref 20–29)
Calcium: 9 mg/dL (ref 8.6–10.2)
Creatinine, Ser: 1.06 mg/dL (ref 0.76–1.27)
GFR calc non Af Amer: 73 mL/min/{1.73_m2} (ref >59)
GFR, EST AFRICAN AMERICAN: 85 mL/min/{1.73_m2} (ref >59)
Glucose: 93 mg/dL (ref 65–99)
POTASSIUM: 4.1 mmol/L (ref 3.5–5.2)
Sodium: 141 mmol/L (ref 134–144)

## 2018-09-22 NOTE — Progress Notes (Signed)
Brian Parker Date of Birth  October 20, 1950 [place] [address]    [place]   [place], [place]  [address]    [place], [place]  [address] [phone]  Fax  [phone]  [phone]  Fax [phone]  1. Hypertension 2. Non Hodgkins Lymphoma 3.  PVCs    Brian Parker has done well from a cardiac standpoint.  He recently had XRT for a growing lymph node in his abdomen.   His BP has been well controlled at home.    January 29, 2013: He continues to have issues with his non-hodgkins lymphoma.  He had XTR and has been declared in remission .  He had a  Left leg puncture would from a drill bit - never really healed well.  He recently had this would debrided and is scheduled to have a left knee replacement once this has healed.  He has not been able to exercise because of these leg issues.   Nov. 13, 2014: Brian Parker is doing well. His BP has been well controlled.  His lymphoma is still in remission.  Playing golf regularly.   Brian Parker 20, 2015: Brian Parker is doing well.  Playing some golf.   BP has been well controlled.    He has been playing golf in the evenings ( walks 9 holes).   He is slowing losing wome weight.    June 27, 2015:   Brian Parker is doing well.  No CP .  Playing golf regularly .  Has been tracking what he eats .  Has been measuring his BP  several times a day   Sept. 1, 2017:  Doing well. Just got back from [place] playing golf.   Played with [name].  Is active.  Also went to [place] and [place].   Walking regularly .  Lymphoma appears to be under good control   Feb. 27, 2018 Doing well Has felt some palpitations Occurs several times ( 15-20 times this AM already )  Not related to activity Seems to be worse when he is sitting  Not associated with CP or dyspnea or pre-syncope.  Went to the ER , was found to have PVCs  Potassium was low. Sleeping well, more caffeine recently  Walks regularly  - walked 18 holes of  golf on Sunday , no issues with walking   03/31/18  Feeling well. BP is a bit higher today . Perhaps ate more salt than he should  No CP or dyspnea.  Has occasional Palpitations .  Has propranolol - does not take it  Snores,   Asked about OSA   September 22, 2018: Brian Parker is doing well.  BP is well controlled  has occasional PVCs  Playing golf regularly .   Walks most of the time   Current Outpatient Medications on File Prior to Visit  Medication Sig Dispense Refill  . amLODipine (NORVASC) 5 MG tablet Take 1 tablet (5 mg total) by mouth daily. 90 tablet 3  . aspirin EC 81 MG tablet Take 81 mg by mouth daily.      [name] doxazosin (CARDURA) 2 MG tablet Take 1 tablet (2 mg total) by mouth at bedtime. 90 tablet 3  . Fish Oil-Krill Oil CAPS Take 1 capsule by mouth daily.    . hydrochlorothiazide (HYDRODIURIL) 25 MG tablet Take 1 tablet (25 mg total) by mouth daily. 90 tablet 2  . lisinopril (PRINIVIL,ZESTRIL) 20 MG tablet  Take 1 tablet (20 mg total) by mouth daily. 90 tablet 3  . Multiple Vitamin (MULTIVITAMIN) tablet Take 1 tablet by mouth daily.      . potassium chloride SA (K-DUR,KLOR-CON) 20 MEQ tablet Take 1 tablet (20 mEq total) by mouth daily. 90 tablet 3  . propranolol (INDERAL) 10 MG tablet Take 1 tablet (10 mg total) by mouth 4 (four) times daily as needed (palpitations or fast heart rate). 90 tablet 3  . tamsulosin (FLOMAX) 0.4 MG CAPS capsule Take 1 capsule by mouth 2 (two) times a week.     No current facility-administered medications on file prior to visit.   Amlodipine 5 mg a day   Allergies  Allergen Reactions  . Losartan     Stomach cramps.    Past Medical History:  Diagnosis Date  . Cancer of kidney, secondary ([place]) 01/06/2010   right kidney//right kidney removed//left kidney functions WDL  . Chest pain    INTERMITTENT/15 years ago//stress induced  . Edema of lower extremity   . Elevated prostate specific antigen (PSA) 11/20/2011  . Follicular lymphoma grade I of  intra-abdominal lymph nodes ([facility]) 12/31/2006  . History of B-cell lymphoma 12/05/2015  . Hypertension   . Kidney carcinoma ([facility]) 03/01/2003   tumor seperate from lymphoma; found during routine screening for lymphoma  . NHL (non-Hodgkin's lymphoma) ([facility]) 12/06/2014  . Nodular lymphoma involving intra-abdominal lymph nodes ([facility]) 12/20/2011  . Non Hodgkin's lymphoma [facility])     Past Surgical History:  Procedure Laterality Date  . APPENDECTOMY    . CARDIOVASCULAR STRESS TEST  10/19/2008   EF 52%, NO ISCHEMIA  . GROIN MASS OPEN BIOPSY    . HERNIA REPAIR  2006-2007   right laparoscopic  . HERNIA REPAIR  2006-2007   left laparoscopic  . NEPHRECTOMY     right kidney  . SHOULDER SURGERY    . TOTAL KNEE ARTHROPLASTY Left 03/25/2013   Dr Marlou Sa  . TOTAL KNEE ARTHROPLASTY Left 03/24/2013   Procedure: LEFT TOTAL KNEE ARTHROPLASTY;  Surgeon: Meredith Pel, MD;  Location: [facility];  Service: Orthopedics;  Laterality: Left;  Left Total Knee Arthroplasty  . US ECHOCARDIOGRAPHY  02/26/2006   EF 55-60%  . VENTRAL HERNIA REPAIR  2003-2004   with mesh after biopsy    Social History   Tobacco Use  Smoking Status Never Smoker  Smokeless Tobacco Never Used    Social History   Substance and Sexual Activity  Alcohol Use Yes  . Alcohol/week: 7.0 standard drinks  . Types: 6 Glasses of wine, 1 Cans of beer per week   Comment: social    Family History  Problem Relation Age of Onset  . Uterine cancer Mother   . Cervical cancer Mother   . Coronary artery disease Father   . Hypertension Father     Reviw of Systems:  Reviewed in the HPI.  All other systems are negative.  Physical Exam: Blood pressure 114/76, pulse (!) 58, height 5\' 10"  (1.778 m), weight 215 lb 1.9 oz (97.6 kg), SpO2 96 %.  GEN:  Well nourished, well developed in no acute distress HEENT: Normal NECK: No JVD; No carotid bruits LYMPHATICS: No lymphadenopathy CARDIAC: RRR frequent premature ventricular contractions RESPIRATORY:   Clear to auscultation without rales, wheezing or rhonchi  ABDOMEN: Soft, non-tender, non-distended MUSCULOSKELETAL:  No edema; No deformity  SKIN: Warm and dry NEUROLOGIC:  Alert and oriented x 3   ECG:   Assessment / Plan:   1. Hypertension:    Blood  Pressure is well controlled.  Continue current medications.  He has had a sleep study and does not have sleep apnea.  2. Premature ventricular contractions:     He is having frequent PVCs.  About every third or fourth heartbeat was a PVC.  Is having some palpitations.  We will get an echocardiogram to assess his left ventricular systolic function.  2. Non Hodgkins Lymphoma-   Stable    Mertie Moores, MD  09/22/2018 9:29 AM    [facility] [place],  [place], [place]  [address] Pager [phone] Phone: [phone]; Fax: [phone]

## 2018-09-22 NOTE — Patient Instructions (Signed)
Medication Instructions:  Your physician recommends that you continue on your current medications as directed. Please refer to the Current Medication list given to you today.   Labwork: TODAY - basic metabolic panel   Testing/Procedures: Your physician has requested that you have an echocardiogram. Echocardiography is a painless test that uses sound waves to create images of your heart. It provides your doctor with information about the size and shape of your heart and how well your heart's chambers and valves are working. This procedure takes approximately one hour. There are no restrictions for this procedure.    Follow-Up: Your physician wants you to follow-up in: 1 year with Dr. Acie Fredrickson. You will receive a reminder letter in the mail two months in advance. If you don't receive a letter, please call our office to schedule the follow-up appointment.   If you need a refill on your cardiac medications before your next appointment, please call your pharmacy.   Thank you for choosing [facility]! Christen Bame, RN [phone]

## 2018-10-01 ENCOUNTER — Other Ambulatory Visit: Payer: Self-pay | Admitting: Cardiovascular Disease

## 2018-10-01 MED ORDER — POTASSIUM CHLORIDE CRYS ER 20 MEQ PO TBCR: 20.0000 meq | EXTENDED_RELEASE_TABLET | Freq: Every day | ORAL | 3 refills | Status: DC

## 2018-10-01 NOTE — Telephone Encounter (Signed)
Pt's medication was sent to pt's pharmacy as requested. Confirmation received.  °

## 2018-10-07 ENCOUNTER — Ambulatory Visit (HOSPITAL_COMMUNITY): Payer: Medicare HMO | Attending: Cardiology

## 2018-10-07 ENCOUNTER — Other Ambulatory Visit: Payer: Self-pay

## 2018-10-07 DIAGNOSIS — I10 Essential (primary) hypertension: Secondary | ICD-10-CM

## 2018-10-07 DIAGNOSIS — I493 Ventricular premature depolarization: Secondary | ICD-10-CM

## 2018-10-07 DIAGNOSIS — R002 Palpitations: Secondary | ICD-10-CM

## 2018-10-08 ENCOUNTER — Telehealth: Payer: Self-pay | Admitting: Nurse Practitioner

## 2018-10-08 ENCOUNTER — Telehealth: Payer: Self-pay | Admitting: Cardiovascular Disease

## 2018-10-08 NOTE — Telephone Encounter (Signed)
-----   Message from Thayer Headings, MD sent at 10/07/2018  3:33 PM EDT ----- Normal LV systolic function.  Grade 1 diastolic dysfunction Mild dilatation of the ascending aorta

## 2018-10-08 NOTE — Telephone Encounter (Signed)
New Message        Patient returned your call. Would like a call back.

## 2018-10-08 NOTE — Telephone Encounter (Signed)
See duplicate telephone encounter from 10/9

## 2018-10-08 NOTE — Telephone Encounter (Signed)
Left message for patient to call back for results.  

## 2018-10-08 NOTE — Telephone Encounter (Signed)
Reviewed echo results with patient and answered questions to his satisfaction. He states PVC symptoms are not bothersome to him at this time. He agrees to call back if symptoms worsen. He thanked me for the call.

## 2018-10-28 DIAGNOSIS — M9902 Segmental and somatic dysfunction of thoracic region: Secondary | ICD-10-CM | POA: Diagnosis not present

## 2018-10-28 DIAGNOSIS — M791 Myalgia, unspecified site: Secondary | ICD-10-CM | POA: Diagnosis not present

## 2018-10-28 DIAGNOSIS — M9903 Segmental and somatic dysfunction of lumbar region: Secondary | ICD-10-CM | POA: Diagnosis not present

## 2018-10-28 DIAGNOSIS — M546 Pain in thoracic spine: Secondary | ICD-10-CM | POA: Diagnosis not present

## 2018-10-28 DIAGNOSIS — M545 Low back pain: Secondary | ICD-10-CM | POA: Diagnosis not present

## 2018-12-04 ENCOUNTER — Other Ambulatory Visit: Payer: Self-pay | Admitting: Hematology and Oncology

## 2018-12-04 DIAGNOSIS — Z85528 Personal history of other malignant neoplasm of kidney: Secondary | ICD-10-CM

## 2018-12-04 DIAGNOSIS — Z8572 Personal history of non-Hodgkin lymphomas: Secondary | ICD-10-CM

## 2018-12-05 ENCOUNTER — Encounter: Payer: Self-pay | Admitting: Hematology and Oncology

## 2018-12-05 ENCOUNTER — Telehealth: Payer: Self-pay | Admitting: Hematology and Oncology

## 2018-12-05 ENCOUNTER — Inpatient Hospital Stay: Payer: Medicare HMO

## 2018-12-05 ENCOUNTER — Inpatient Hospital Stay: Payer: Medicare HMO | Attending: Hematology and Oncology | Admitting: Hematology and Oncology

## 2018-12-05 VITALS — BP 146/87 | HR 63 | Temp 98.0°F | Resp 20 | Ht 70.0 in | Wt 218.9 lb

## 2018-12-05 DIAGNOSIS — Z85528 Personal history of other malignant neoplasm of kidney: Secondary | ICD-10-CM | POA: Insufficient documentation

## 2018-12-05 DIAGNOSIS — Z23 Encounter for immunization: Secondary | ICD-10-CM | POA: Insufficient documentation

## 2018-12-05 DIAGNOSIS — Z0000 Encounter for general adult medical examination without abnormal findings: Secondary | ICD-10-CM

## 2018-12-05 DIAGNOSIS — Z8572 Personal history of non-Hodgkin lymphomas: Secondary | ICD-10-CM

## 2018-12-05 DIAGNOSIS — Z85828 Personal history of other malignant neoplasm of skin: Secondary | ICD-10-CM | POA: Diagnosis not present

## 2018-12-05 LAB — CBC WITH DIFFERENTIAL/PLATELET
Abs Immature Granulocytes: 0.02 10*3/uL (ref 0.00–0.07)
Basophils Absolute: 0 10*3/uL (ref 0.0–0.1)
Basophils Relative: 0 %
Eosinophils Absolute: 0.1 10*3/uL (ref 0.0–0.5)
Eosinophils Relative: 2 %
HCT: 50.3 % (ref 39.0–52.0)
Hemoglobin: 16.7 g/dL (ref 13.0–17.0)
Immature Granulocytes: 0 %
Lymphocytes Relative: 16 %
Lymphs Abs: 1.1 10*3/uL (ref 0.7–4.0)
MCH: 30 pg (ref 26.0–34.0)
MCHC: 33.2 g/dL (ref 30.0–36.0)
MCV: 90.5 fL (ref 80.0–100.0)
MONO ABS: 0.4 10*3/uL (ref 0.1–1.0)
Monocytes Relative: 6 %
Neutro Abs: 5.5 10*3/uL (ref 1.7–7.7)
Neutrophils Relative %: 76 %
Platelets: 157 10*3/uL (ref 150–400)
RBC: 5.56 MIL/uL (ref 4.22–5.81)
RDW: 12.7 % (ref 11.5–15.5)
WBC: 7.2 10*3/uL (ref 4.0–10.5)
nRBC: 0 % (ref 0.0–0.2)

## 2018-12-05 LAB — COMPREHENSIVE METABOLIC PANEL
ALT: 21 U/L (ref 0–44)
AST: 23 U/L (ref 15–41)
Albumin: 4.2 g/dL (ref 3.5–5.0)
Alkaline Phosphatase: 75 U/L (ref 38–126)
Anion gap: 12 (ref 5–15)
BUN: 20 mg/dL (ref 8–23)
CO2: 27 mmol/L (ref 22–32)
Calcium: 9.2 mg/dL (ref 8.9–10.3)
Chloride: 102 mmol/L (ref 98–111)
Creatinine, Ser: 1.1 mg/dL (ref 0.61–1.24)
GFR calc Af Amer: >60 mL/min (ref >60)
Glucose, Bld: 84 mg/dL (ref 70–99)
Potassium: 3.8 mmol/L (ref 3.5–5.1)
Sodium: 141 mmol/L (ref 135–145)
Total Bilirubin: 0.8 mg/dL (ref 0.3–1.2)
Total Protein: 6.7 g/dL (ref 6.5–8.1)

## 2018-12-05 LAB — LACTATE DEHYDROGENASE: LDH: 201 U/L — ABNORMAL HIGH (ref 98–192)

## 2018-12-05 MED ORDER — INFLUENZA VAC SPLIT QUAD 0.5 ML IM SUSY
PREFILLED_SYRINGE | INTRAMUSCULAR | Status: AC
  Filled 2018-12-05: qty 0.5

## 2018-12-05 MED ORDER — INFLUENZA VAC SPLIT QUAD 0.5 ML IM SUSY
0.5000 mL | PREFILLED_SYRINGE | Freq: Once | INTRAMUSCULAR | Status: AC
  Administered 2018-12-05: 0.5 mL via INTRAMUSCULAR

## 2018-12-05 NOTE — Assessment & Plan Note (Signed)
He denies recent hematuria Denies abdominal pain.  He will continue follow-up with urologist

## 2018-12-05 NOTE — Assessment & Plan Note (Addendum)
Clinically, he has no palpable lymphadenopathy.   CT scan of the abdomen and pelvis without contrast in 2018 excluded cancer recurrence  I will continue to see him once a year with history, physical examination and blood work  We discussed the importance of preventive care and reviewed the vaccination programs. He does not have any prior allergic reactions to influenza vaccination. He agrees to proceed with influenza vaccination today and we will administer it today at the clinic.

## 2018-12-05 NOTE — Assessment & Plan Note (Signed)
He has no signs and symptoms of new skin cancer.  We discussed the importance of vigilant skin exam

## 2018-12-05 NOTE — Progress Notes (Signed)
[facility] OFFICE PROGRESS NOTE  Patient Care Team: Hulan Fess, MD as PCP - General (Family Medicine) Arloa Koh, MD as Consulting Physician (Radiation Oncology) Kathie Rhodes, MD as Consulting Physician (Urology) Heath Lark, MD as Consulting Physician (Hematology and Oncology)  ASSESSMENT & PLAN:  History of B-cell lymphoma Clinically, he has no palpable lymphadenopathy.   CT scan of the abdomen and pelvis without contrast in 2018 excluded cancer recurrence  I will continue to see him once a year with history, physical examination and blood work  We discussed the importance of preventive care and reviewed the vaccination programs. He does not have any prior allergic reactions to influenza vaccination. He agrees to proceed with influenza vaccination today and we will administer it today at the clinic.   History of kidney cancer He denies recent hematuria Denies abdominal pain.  He will continue follow-up with urologist  History of skin cancer He has no signs and symptoms of new skin cancer.  We discussed the importance of vigilant skin exam   No orders of the defined types were placed in this encounter.   INTERVAL HISTORY: Please see below for problem oriented charting. He returns for further follow-up  he denies new skin lesions No new lymphadenopathy Denies abdominal pain or changes of bowel habits, urination or hematuria  SUMMARY OF ONCOLOGIC HISTORY:   NHL (non-Hodgkin's lymphoma) ([place]) (Resolved)   12/10/2017 Imaging    1. Stable CT of the abdomen and pelvis. No evidence for recurrent lymphoma. 2. Status post right nephrectomy. No evidence for local tumor recurrence or metastatic disease. 3. Status post repair of ventral abdominal wall hernia.     REVIEW OF SYSTEMS:   Constitutional: Denies fevers, chills or abnormal weight loss Eyes: Denies blurriness of vision Ears, nose, mouth, throat, and face: Denies mucositis or sore  throat Respiratory: Denies cough, dyspnea or wheezes Cardiovascular: Denies palpitation, chest discomfort or lower extremity swelling Gastrointestinal:  Denies nausea, heartburn or change in bowel habits Skin: Denies abnormal skin rashes Lymphatics: Denies new lymphadenopathy or easy bruising Neurological:Denies numbness, tingling or new weaknesses Behavioral/Psych: Mood is stable, no new changes  All other systems were reviewed with the patient and are negative.  I have reviewed the past medical history, past surgical history, social history and family history with the patient and they are unchanged from previous note.  ALLERGIES:  is allergic to losartan.  MEDICATIONS:  Current Outpatient Medications  Medication Sig Dispense Refill  . amLODipine (NORVASC) 5 MG tablet Take 1 tablet (5 mg total) by mouth daily. 90 tablet 3  . aspirin EC 81 MG tablet Take 81 mg by mouth daily.      [name] doxazosin (CARDURA) 2 MG tablet Take 1 tablet (2 mg total) by mouth at bedtime. 90 tablet 3  . Fish Oil-Krill Oil CAPS Take 1 capsule by mouth daily.    . hydrochlorothiazide (HYDRODIURIL) 25 MG tablet Take 1 tablet (25 mg total) by mouth daily. 90 tablet 2  . lisinopril (PRINIVIL,ZESTRIL) 20 MG tablet Take 1 tablet (20 mg total) by mouth daily. 90 tablet 3  . Multiple Vitamin (MULTIVITAMIN) tablet Take 1 tablet by mouth daily.      . potassium chloride SA (K-DUR,KLOR-CON) 20 MEQ tablet Take 1 tablet (20 mEq total) by mouth daily. 90 tablet 3  . propranolol (INDERAL) 10 MG tablet Take 1 tablet (10 mg total) by mouth 4 (four) times daily as needed (palpitations or fast heart rate). 90 tablet 3  . tamsulosin (FLOMAX) 0.4  MG CAPS capsule Take 1 capsule by mouth 2 (two) times a week.     No current facility-administered medications for this visit.     PHYSICAL EXAMINATION: ECOG PERFORMANCE STATUS: 0 - Asymptomatic  Vitals:   12/05/18 1151  BP: (!) 146/87  Pulse: 63  Resp: 20  Temp: 98 F (36.7 C)   SpO2: 97%   Filed Weights   12/05/18 1151  Weight: 218 lb 14.4 oz (99.3 kg)    GENERAL:alert, no distress and comfortable SKIN: skin color, texture, turgor are normal, no rashes or significant lesions EYES: normal, Conjunctiva are pink and non-injected, sclera clear OROPHARYNX:no exudate, no erythema and lips, buccal mucosa, and tongue normal  NECK: supple, thyroid normal size, non-tender, without nodularity LYMPH:  no palpable lymphadenopathy in the cervical, axillary or inguinal LUNGS: clear to auscultation and percussion with normal breathing effort HEART: regular rate & rhythm and no murmurs and no lower extremity edema ABDOMEN:abdomen soft, non-tender and normal bowel sounds Musculoskeletal:no cyanosis of digits and no clubbing  NEURO: alert & oriented x 3 with fluent speech, no focal motor/sensory deficits  LABORATORY DATA:  I have reviewed the data as listed    Component Value Date/Time   NA 141 12/05/2018 1119   NA 141 09/22/2018 0951   NA 141 12/05/2017 1013   K 3.8 12/05/2018 1119   K 3.9 12/05/2017 1013   CL 102 12/05/2018 1119   CL 105 03/02/2013 0818   CO2 27 12/05/2018 1119   CO2 27 12/05/2017 1013   GLUCOSE 84 12/05/2018 1119   GLUCOSE 84 12/05/2017 1013   GLUCOSE 88 03/02/2013 0818   BUN 20 12/05/2018 1119   BUN 18 09/22/2018 0951   BUN 16.9 12/05/2017 1013   CREATININE 1.10 12/05/2018 1119   CREATININE 1.1 12/05/2017 1013   CALCIUM 9.2 12/05/2018 1119   CALCIUM 9.3 12/05/2017 1013   PROT 6.7 12/05/2018 1119   PROT 7.0 12/05/2017 1013   ALBUMIN 4.2 12/05/2018 1119   ALBUMIN 4.4 12/05/2017 1013   AST 23 12/05/2018 1119   AST 20 12/05/2017 1013   ALT 21 12/05/2018 1119   ALT 18 12/05/2017 1013   ALKPHOS 75 12/05/2018 1119   ALKPHOS 71 12/05/2017 1013   BILITOT 0.8 12/05/2018 1119   BILITOT 0.75 12/05/2017 1013   GFRNONAA >60 12/05/2018 1119   GFRAA >60 12/05/2018 1119    No results found for: SPEP, UPEP  Lab Results  Component Value Date    WBC 7.2 12/05/2018   NEUTROABS 5.5 12/05/2018   HGB 16.7 12/05/2018   HCT 50.3 12/05/2018   MCV 90.5 12/05/2018   PLT 157 12/05/2018      Chemistry      Component Value Date/Time   NA 141 12/05/2018 1119   NA 141 09/22/2018 0951   NA 141 12/05/2017 1013   K 3.8 12/05/2018 1119   K 3.9 12/05/2017 1013   CL 102 12/05/2018 1119   CL 105 03/02/2013 0818   CO2 27 12/05/2018 1119   CO2 27 12/05/2017 1013   BUN 20 12/05/2018 1119   BUN 18 09/22/2018 0951   BUN 16.9 12/05/2017 1013   CREATININE 1.10 12/05/2018 1119   CREATININE 1.1 12/05/2017 1013      Component Value Date/Time   CALCIUM 9.2 12/05/2018 1119   CALCIUM 9.3 12/05/2017 1013   ALKPHOS 75 12/05/2018 1119   ALKPHOS 71 12/05/2017 1013   AST 23 12/05/2018 1119   AST 20 12/05/2017 1013   ALT 21 12/05/2018 1119  ALT 18 12/05/2017 1013   BILITOT 0.8 12/05/2018 1119   BILITOT 0.75 12/05/2017 1013      All questions were answered. The patient knows to call the clinic with any problems, questions or concerns. No barriers to learning was detected.  I spent 15 minutes counseling the patient face to face. The total time spent in the appointment was 20 minutes and more than 50% was on counseling and review of test results  Heath Lark, MD 12/05/2018 2:47 PM

## 2018-12-05 NOTE — Telephone Encounter (Signed)
Gave avs and calendar ° °

## 2018-12-15 ENCOUNTER — Other Ambulatory Visit: Payer: Self-pay | Admitting: Cardiovascular Disease

## 2018-12-15 MED ORDER — DOXAZOSIN MESYLATE 2 MG PO TABS: 2.0000 mg | ORAL_TABLET | Freq: Every day | ORAL | 2 refills | Status: DC

## 2019-01-05 DIAGNOSIS — M9903 Segmental and somatic dysfunction of lumbar region: Secondary | ICD-10-CM | POA: Diagnosis not present

## 2019-01-05 DIAGNOSIS — M791 Myalgia, unspecified site: Secondary | ICD-10-CM | POA: Diagnosis not present

## 2019-01-05 DIAGNOSIS — M9902 Segmental and somatic dysfunction of thoracic region: Secondary | ICD-10-CM | POA: Diagnosis not present

## 2019-01-05 DIAGNOSIS — M545 Low back pain: Secondary | ICD-10-CM | POA: Diagnosis not present

## 2019-01-05 DIAGNOSIS — M546 Pain in thoracic spine: Secondary | ICD-10-CM | POA: Diagnosis not present

## 2019-02-11 ENCOUNTER — Encounter (INDEPENDENT_AMBULATORY_CARE_PROVIDER_SITE_OTHER): Payer: Self-pay | Admitting: Orthopedic Surgery

## 2019-02-11 ENCOUNTER — Ambulatory Visit (INDEPENDENT_AMBULATORY_CARE_PROVIDER_SITE_OTHER): Payer: Medicare HMO

## 2019-02-11 ENCOUNTER — Ambulatory Visit (INDEPENDENT_AMBULATORY_CARE_PROVIDER_SITE_OTHER): Payer: Medicare HMO | Admitting: Orthopedic Surgery

## 2019-02-11 DIAGNOSIS — G8929 Other chronic pain: Secondary | ICD-10-CM

## 2019-02-11 DIAGNOSIS — M25561 Pain in right knee: Secondary | ICD-10-CM

## 2019-02-14 DIAGNOSIS — M25561 Pain in right knee: Secondary | ICD-10-CM | POA: Diagnosis not present

## 2019-02-14 DIAGNOSIS — G8929 Other chronic pain: Secondary | ICD-10-CM | POA: Diagnosis not present

## 2019-02-14 MED ORDER — BUPIVACAINE HCL 0.25 % IJ SOLN
4.0000 mL | INTRAMUSCULAR | Status: AC | PRN
  Administered 2019-02-14: 4 mL via INTRA_ARTICULAR

## 2019-02-14 MED ORDER — METHYLPREDNISOLONE ACETATE 40 MG/ML IJ SUSP
40.0000 mg | INTRAMUSCULAR | Status: AC | PRN
  Administered 2019-02-14: 40 mg via INTRA_ARTICULAR

## 2019-02-14 MED ORDER — LIDOCAINE HCL 1 % IJ SOLN
5.0000 mL | INTRAMUSCULAR | Status: AC | PRN
  Administered 2019-02-14: 5 mL

## 2019-02-14 NOTE — Progress Notes (Signed)
Office Visit Note   Patient: Brian Parker           Date of Birth: 1950-06-21           MRN: [id number] Visit Date: 02/11/2019 Requested by: Brian Fess, MD [place], [place] [address] PCP: Brian Fess, MD  Subjective: Chief Complaint  Patient presents with  . Right Knee - Pain, Edema    HPI: Brian Parker is a patient with right knee pain.'s been going on for 2 months.  Seems to be worsening.  States the entire knee hurts.  The entire knee hurts at times.  Radiates down the lower leg.  Not taking any medication except for occasional ibuprofen.  Denies any mechanical symptoms.  When he is walking downhill the knee hurts the worst.              ROS: All systems reviewed are negative as they relate to the chief complaint within the history of present illness.  Patient denies  fevers or chills.   Assessment & Plan: Visit Diagnoses:  1. Chronic pain of right knee     Plan: Impression is right knee pain with only mild degenerative changes noted on radiographs.  He does have a fair amount of fluid in the knee itself.  No nerve root tension signs or radiographic abnormality.  Aspiration injection of that knee is performed today.  We will consider MRI scanning as a next step if his symptoms persist.  Follow-Up Instructions: No follow-ups on file.   Orders:  Orders Placed This Encounter  Procedures  . XR KNEE 3 VIEW RIGHT   No orders of the defined types were placed in this encounter.     Procedures: Large Joint Inj: R knee on 02/14/2019 10:35 AM Indications: diagnostic evaluation, joint swelling and pain Details: 18 G 1.5 in needle, superolateral approach  Arthrogram: No  Medications: 5 mL lidocaine 1 %; 40 mg methylPREDNISolone acetate 40 MG/ML; 4 mL bupivacaine 0.25 % Outcome: tolerated well, no immediate complications Procedure, treatment alternatives, risks and benefits explained, specific risks discussed. Consent was given by the patient. Immediately prior to  procedure a time out was called to verify the correct patient, procedure, equipment, support staff and site/side marked as required. Patient was prepped and draped in the usual sterile fashion.       Clinical Data: No additional findings.  Objective: Vital Signs: There were no vitals taken for this visit.  Physical Exam:   Constitutional: Patient appears well-developed HEENT:  Head: Normocephalic Eyes:EOM are normal Neck: Normal range of motion Cardiovascular: Normal rate Pulmonary/chest: Effort normal Neurologic: Patient is alert Skin: Skin is warm Psychiatric: Patient has normal mood and affect    Ortho Exam: Ortho exam demonstrates normal gait alignment.  Mild effusion present in that right knee no effusion left knee.  Range of motion is full on the right-hand side with stable collateral cruciate ligaments.  Pedal pulses palpable.  No varicosities noted in that right lower extremity.  Specialty Comments:  No specialty comments available.  Imaging: No results found.   PMFS History: Patient Active Problem List   Diagnosis Date Noted  . PVC (premature ventricular contraction) 02/26/2017  . History of B-cell lymphoma 12/05/2015  . History of skin cancer 12/05/2015  . Preventive measure 12/06/2014  . Elevated prostate specific antigen (PSA) 11/20/2011  . Essential hypertension 11/09/2011  . History of kidney cancer 03/01/2003   Past Medical History:  Diagnosis Date  . Cancer of kidney, secondary ([facility]) 01/06/2010  right kidney//right kidney removed//left kidney functions WDL  . Chest pain    INTERMITTENT/15 years ago//stress induced  . Edema of lower extremity   . Elevated prostate specific antigen (PSA) 11/20/2011  . Follicular lymphoma grade I of intra-abdominal lymph nodes ([facility]) 12/31/2006  . History of B-cell lymphoma 12/05/2015  . Hypertension   . Kidney carcinoma ([facility]) 03/01/2003   tumor seperate from lymphoma; found during routine screening for lymphoma  .  NHL (non-Hodgkin's lymphoma) ([facility]) 12/06/2014  . Nodular lymphoma involving intra-abdominal lymph nodes ([facility]) 12/20/2011  . Non Hodgkin's lymphoma ([facility])     Family History  Problem Relation Age of Onset  . Uterine cancer Mother   . Cervical cancer Mother   . Coronary artery disease Father   . Hypertension Father     Past Surgical History:  Procedure Laterality Date  . APPENDECTOMY    . CARDIOVASCULAR STRESS TEST  10/19/2008   EF 52%, NO ISCHEMIA  . GROIN MASS OPEN BIOPSY    . HERNIA REPAIR  2006-2007   right laparoscopic  . HERNIA REPAIR  2006-2007   left laparoscopic  . NEPHRECTOMY     right kidney  . SHOULDER SURGERY    . TOTAL KNEE ARTHROPLASTY Left 03/25/2013   Dr Brian Parker  . TOTAL KNEE ARTHROPLASTY Left 03/24/2013   Procedure: LEFT TOTAL KNEE ARTHROPLASTY;  Surgeon: Brian Pel, MD;  Location: [facility];  Service: Orthopedics;  Laterality: Left;  Left Total Knee Arthroplasty  . US ECHOCARDIOGRAPHY  02/26/2006   EF 55-60%  . VENTRAL HERNIA REPAIR  2003-2004   with mesh after biopsy   Social History   Occupational History  . Not on file  Tobacco Use  . Smoking status: Never Smoker  . Smokeless tobacco: Never Used  Substance and Sexual Activity  . Alcohol use: Yes    Alcohol/week: 7.0 standard drinks    Types: 6 Glasses of wine, 1 Cans of beer per week    Comment: social  . Drug use: No  . Sexual activity: Not on file

## 2019-05-22 ENCOUNTER — Telehealth: Payer: Self-pay | Admitting: Orthopedic Surgery

## 2019-05-22 DIAGNOSIS — G8929 Other chronic pain: Secondary | ICD-10-CM

## 2019-05-22 NOTE — Telephone Encounter (Signed)
Patient called asked if he can get set up for an MRI   The number to contact patient is [phone]

## 2019-05-22 NOTE — Telephone Encounter (Signed)
See message below °

## 2019-05-26 NOTE — Telephone Encounter (Signed)
Okay for right knee MRI scan to evaluate for meniscal pathology

## 2019-05-26 NOTE — Telephone Encounter (Signed)
I put order in. Patient wanted to know if he could have scan from knee down? He said that he is having lateral lower leg pain as well.

## 2019-05-27 NOTE — Telephone Encounter (Signed)
I tried calling. No answer.

## 2019-05-27 NOTE — Telephone Encounter (Signed)
Not really practical the mri gets 1/3 the way down the leg of the knee

## 2019-06-01 DIAGNOSIS — R972 Elevated prostate specific antigen [PSA]: Secondary | ICD-10-CM | POA: Diagnosis not present

## 2019-06-11 DIAGNOSIS — R3915 Urgency of urination: Secondary | ICD-10-CM | POA: Diagnosis not present

## 2019-06-11 DIAGNOSIS — N401 Enlarged prostate with lower urinary tract symptoms: Secondary | ICD-10-CM | POA: Diagnosis not present

## 2019-06-11 DIAGNOSIS — R972 Elevated prostate specific antigen [PSA]: Secondary | ICD-10-CM | POA: Diagnosis not present

## 2019-06-17 ENCOUNTER — Other Ambulatory Visit: Payer: Self-pay

## 2019-06-17 ENCOUNTER — Ambulatory Visit
Admission: RE | Admit: 2019-06-17 | Discharge: 2019-06-17 | Disposition: A | Discharge status: Home / Self Care | Payer: Medicare HMO | Source: Ambulatory Visit | Attending: Orthopedic Surgery | Admitting: Orthopedic Surgery

## 2019-06-17 DIAGNOSIS — M25561 Pain in right knee: Secondary | ICD-10-CM | POA: Diagnosis not present

## 2019-06-17 DIAGNOSIS — G8929 Other chronic pain: Secondary | ICD-10-CM

## 2019-06-29 ENCOUNTER — Other Ambulatory Visit: Payer: Self-pay

## 2019-06-29 ENCOUNTER — Ambulatory Visit (INDEPENDENT_AMBULATORY_CARE_PROVIDER_SITE_OTHER): Payer: Medicare HMO

## 2019-06-29 ENCOUNTER — Ambulatory Visit (INDEPENDENT_AMBULATORY_CARE_PROVIDER_SITE_OTHER): Payer: Medicare HMO | Admitting: Orthopedic Surgery

## 2019-06-29 ENCOUNTER — Encounter: Payer: Self-pay | Admitting: Orthopedic Surgery

## 2019-06-29 DIAGNOSIS — M79604 Pain in right leg: Secondary | ICD-10-CM | POA: Diagnosis not present

## 2019-06-30 ENCOUNTER — Telehealth: Payer: Self-pay

## 2019-06-30 ENCOUNTER — Other Ambulatory Visit: Payer: Medicare HMO

## 2019-06-30 NOTE — Telephone Encounter (Signed)
Submitted VOB for Gel-One, bilateral knee.  

## 2019-07-01 ENCOUNTER — Encounter: Payer: Self-pay | Admitting: Orthopedic Surgery

## 2019-07-01 NOTE — Progress Notes (Signed)
Office Visit Note   Patient: Brian Parker           Date of Birth: 1950/06/11           MRN: [id number] Visit Date: 06/29/2019 Requested by: Hulan Fess, MD [place],  [place] [address] PCP: Hulan Fess, MD  Subjective: Chief Complaint  Patient presents with   Follow-up    HPI: Slayde is a patient with right knee pain.  Since have seen him he has had an MRI scan.  Had cortisone injection in February which helped him for a month.  MRI scan is reviewed with the patient.  He has degenerative changes lateral greater than medial.  He also is reporting some lateral sided calf pain but no low back pain.  Goes to a chiropractor for manipulation which helps.  This calf business is been going on for about 3 months.  He is seen his oncologist in December who told him that his lymphoma was still doing well.  He does have a PSA problem which needs to be worked up and treated.              ROS: All systems reviewed are negative as they relate to the chief complaint within the history of present illness.  Patient denies  fevers or chills.   Assessment & Plan: Visit Diagnoses:  1. Pain in right leg     Plan: Impression is right knee degenerative changes which is accounting for his effusion.  The real question that the MRI scan answered was is there anything arthroscopically treatable in that right knee and it does not appear to be the case.  I would favor gel injection in that right knee.  I think that could help him delay his need for knee replacement.  His left total knee is doing well.This patient is diagnosed with osteoarthritis of the knee(s).    Radiographs show evidence of joint space narrowing, osteophytes, subchondral sclerosis and/or subchondral cysts.  This patient has knee pain which interferes with functional and activities of daily living.    This patient has experienced inadequate response, adverse effects and/or intolerance with conservative treatments such as  acetaminophen, NSAIDS, topical creams, physical therapy or regular exercise, knee bracing and/or weight loss.   This patient has experienced inadequate response or has a contraindication to intra articular steroid injections for at least 3 months.   This patient is not scheduled to have a total knee replacement within 6 months of starting treatment with viscosupplementation.   Follow-Up Instructions: Return in about 3 weeks (around 07/20/2019).   Orders:  Orders Placed This Encounter  Procedures   XR Tibia/Fibula Right   No orders of the defined types were placed in this encounter.     Procedures: No procedures performed   Clinical Data: No additional findings.  Objective: Vital Signs: There were no vitals taken for this visit.  Physical Exam:   Constitutional: Patient appears well-developed HEENT:  Head: Normocephalic Eyes:EOM are normal Neck: Normal range of motion Cardiovascular: Normal rate Pulmonary/chest: Effort normal Neurologic: Patient is alert Skin: Skin is warm Psychiatric: Patient has normal mood and affect    Ortho Exam: Ortho exam demonstrates effusion in the right knee which is moderate.  Extensor mechanism is intact.  Collateral crucial ligaments are stable.  No masses lymphadenopathy or skin changes noted in that right knee region.  Range of motion is full.  No warmth to the right knee.  Specialty Comments:  No specialty comments available.  Imaging: No results found.   PMFS History: Patient Active Problem List   Diagnosis Date Noted   PVC (premature ventricular contraction) 02/26/2017   History of B-cell lymphoma 12/05/2015   History of skin cancer 12/05/2015   Preventive measure 12/06/2014   Elevated prostate specific antigen (PSA) 11/20/2011   Essential hypertension 11/09/2011   History of kidney cancer 03/01/2003   Past Medical History:  Diagnosis Date   Cancer of kidney, secondary ([facility]) 01/06/2010   right kidney//right  kidney removed//left kidney functions WDL   Chest pain    INTERMITTENT/15 years ago//stress induced   Edema of lower extremity    Elevated prostate specific antigen (PSA) 22/63/3354   Follicular lymphoma grade I of intra-abdominal lymph nodes ([facility]) 12/31/2006   History of B-cell lymphoma 12/05/2015   Hypertension    Kidney carcinoma ([facility]) 03/01/2003   tumor seperate from lymphoma; found during routine screening for lymphoma   NHL (non-Hodgkin's lymphoma) ([place]) 12/06/2014   Nodular lymphoma involving intra-abdominal lymph nodes ([facility]) 12/20/2011   Non Hodgkin's lymphoma ([facility])     Family History  Problem Relation Age of Onset   Uterine cancer Mother    Cervical cancer Mother    Coronary artery disease Father    Hypertension Father     Past Surgical History:  Procedure Laterality Date   APPENDECTOMY     CARDIOVASCULAR STRESS TEST  10/19/2008   EF 52%, NO ISCHEMIA   GROIN MASS OPEN BIOPSY     HERNIA REPAIR  2006-2007   right laparoscopic   HERNIA REPAIR  2006-2007   left laparoscopic   NEPHRECTOMY     right kidney   SHOULDER SURGERY     TOTAL KNEE ARTHROPLASTY Left 03/25/2013   Dr Marlou Sa   TOTAL KNEE ARTHROPLASTY Left 03/24/2013   Procedure: LEFT TOTAL KNEE ARTHROPLASTY;  Surgeon: Meredith Pel, MD;  Location: [facility];  Service: Orthopedics;  Laterality: Left;  Left Total Knee Arthroplasty   US ECHOCARDIOGRAPHY  02/26/2006   EF 55-60%   VENTRAL HERNIA REPAIR  2003-2004   with mesh after biopsy   Social History   Occupational History   Not on file  Tobacco Use   Smoking status: Never Smoker   Smokeless tobacco: Never Used  Substance and Sexual Activity   Alcohol use: Yes    Alcohol/week: 7.0 standard drinks    Types: 6 Glasses of wine, 1 Cans of beer per week    Comment: social   Drug use: No   Sexual activity: Not on file

## 2019-07-07 ENCOUNTER — Telehealth: Payer: Self-pay

## 2019-07-07 NOTE — Telephone Encounter (Signed)
Talked with patient and advised that he is approved for gel injection.  Approved for Gel-One, bilateral knee. [place] Patient will be responsible for 20% OOP. Co-pay of $25.00 PA required PA Approval# [id number] Valid 07/06/2019- 10/06/2019  Appt. 07/09/2019 with Dr. Marlou Sa

## 2019-07-09 ENCOUNTER — Ambulatory Visit (INDEPENDENT_AMBULATORY_CARE_PROVIDER_SITE_OTHER): Payer: Medicare HMO | Admitting: Orthopedic Surgery

## 2019-07-09 ENCOUNTER — Encounter: Payer: Self-pay | Admitting: Orthopedic Surgery

## 2019-07-09 ENCOUNTER — Other Ambulatory Visit: Payer: Self-pay

## 2019-07-09 DIAGNOSIS — M1711 Unilateral primary osteoarthritis, right knee: Secondary | ICD-10-CM | POA: Diagnosis not present

## 2019-07-09 MED ORDER — SODIUM HYALURONATE (VISCOSUP) 16.8 MG/2ML IX SOSY
16.8000 mg | PREFILLED_SYRINGE | INTRA_ARTICULAR | Status: AC | PRN
  Administered 2019-07-09: 16.8 mg via INTRA_ARTICULAR

## 2019-07-09 MED ORDER — LIDOCAINE HCL 1 % IJ SOLN
5.0000 mL | INTRAMUSCULAR | Status: AC | PRN
  Administered 2019-07-09: 5 mL

## 2019-07-09 NOTE — Progress Notes (Signed)
   Procedure Note  Patient: Brian Parker             Date of Birth: 1950/02/23           MRN: [id number]             Visit Date: 07/09/2019  Procedures: Visit Diagnoses:  1. Arthritis of right knee     Large Joint Inj: R knee on 07/09/2019 10:37 PM Indications: diagnostic evaluation, joint swelling and pain Details: 18 G 1.5 in needle, superolateral approach  Arthrogram: No  Medications: 5 mL lidocaine 1 %; 16.8 mg Sodium Hyaluronate (Viscosup) 16.8 MG/2ML Outcome: tolerated well, no immediate complications Procedure, treatment alternatives, risks and benefits explained, specific risks discussed. Consent was given by the patient. Immediately prior to procedure a time out was called to verify the correct patient, procedure, equipment, support staff and site/side marked as required. Patient was prepped and draped in the usual sterile fashion.     This patient is diagnosed with osteoarthritis of the knee(s).    Radiographs show evidence of joint space narrowing, osteophytes, subchondral sclerosis and/or subchondral cysts.  This patient has knee pain which interferes with functional and activities of daily living.    This patient has experienced inadequate response, adverse effects and/or intolerance with conservative treatments such as acetaminophen, NSAIDS, topical creams, physical therapy or regular exercise, knee bracing and/or weight loss.   This patient has experienced inadequate response or has a contraindication to intra articular steroid injections for at least 3 months.   This patient is not scheduled to have a total knee replacement within 6 months of starting treatment with viscosupplementation.

## 2019-07-15 DIAGNOSIS — M545 Low back pain: Secondary | ICD-10-CM | POA: Diagnosis not present

## 2019-07-15 DIAGNOSIS — M9902 Segmental and somatic dysfunction of thoracic region: Secondary | ICD-10-CM | POA: Diagnosis not present

## 2019-07-15 DIAGNOSIS — M791 Myalgia, unspecified site: Secondary | ICD-10-CM | POA: Diagnosis not present

## 2019-07-15 DIAGNOSIS — M546 Pain in thoracic spine: Secondary | ICD-10-CM | POA: Diagnosis not present

## 2019-07-15 DIAGNOSIS — M9903 Segmental and somatic dysfunction of lumbar region: Secondary | ICD-10-CM | POA: Diagnosis not present

## 2019-07-15 DIAGNOSIS — M25561 Pain in right knee: Secondary | ICD-10-CM | POA: Diagnosis not present

## 2019-07-21 DIAGNOSIS — R972 Elevated prostate specific antigen [PSA]: Secondary | ICD-10-CM | POA: Diagnosis not present

## 2019-07-29 DIAGNOSIS — N401 Enlarged prostate with lower urinary tract symptoms: Secondary | ICD-10-CM | POA: Diagnosis not present

## 2019-07-29 DIAGNOSIS — R3912 Poor urinary stream: Secondary | ICD-10-CM | POA: Diagnosis not present

## 2019-07-29 DIAGNOSIS — R972 Elevated prostate specific antigen [PSA]: Secondary | ICD-10-CM | POA: Diagnosis not present

## 2019-10-08 ENCOUNTER — Other Ambulatory Visit: Payer: Self-pay

## 2019-10-08 ENCOUNTER — Ambulatory Visit: Payer: Medicare HMO | Admitting: Cardiovascular Disease

## 2019-10-08 ENCOUNTER — Encounter: Payer: Self-pay | Admitting: Cardiovascular Disease

## 2019-10-08 VITALS — BP 134/84 | HR 77 | Ht 70.0 in | Wt 226.8 lb

## 2019-10-08 DIAGNOSIS — R002 Palpitations: Secondary | ICD-10-CM

## 2019-10-08 DIAGNOSIS — I493 Ventricular premature depolarization: Secondary | ICD-10-CM | POA: Diagnosis not present

## 2019-10-08 DIAGNOSIS — I10 Essential (primary) hypertension: Secondary | ICD-10-CM | POA: Diagnosis not present

## 2019-10-08 NOTE — Progress Notes (Signed)
Brian Parker Date of Birth  06-19-1950 [place] [address]    [place]   [place], [place]  [address]    [place], [place]  [address] [phone]  Fax  [phone]  [phone]  Fax [phone]  1. Hypertension 2. Non Hodgkins Lymphoma 3.  PVCs    Brian Parker has done well from a cardiac standpoint.  He recently had XRT for a growing lymph node in his abdomen.   His BP has been well controlled at home.    January 29, 2013: He continues to have issues with his non-hodgkins lymphoma.  He had XTR and has been declared in remission .  He had a  Left leg puncture would from a drill bit - never really healed well.  He recently had this would debrided and is scheduled to have a left knee replacement once this has healed.  He has not been able to exercise because of these leg issues.   Nov. 13, 2014: Brian Parker is doing well. His BP has been well controlled.  His lymphoma is still in remission.  Playing golf regularly.   May 19, 2014: Brian Parker is doing well.  Playing some golf.   BP has been well controlled.    He has been playing golf in the evenings ( walks 9 holes).   He is slowing losing wome weight.    June 27, 2015:   Brian Parker is doing well.  No CP .  Playing golf regularly .  Has been tracking what he eats .  Has been measuring his BP  several times a day   Sept. 1, 2017:  Doing well. Just got back from [place] playing golf.   Played with Brian Parker.  Is active.  Also went to [place] and [place].   Walking regularly .  Lymphoma appears to be under good control   Feb. 27, 2018 Doing well Has felt some palpitations Occurs several times ( 15-20 times this AM already )  Not related to activity Seems to be worse when he is sitting  Not associated with CP or dyspnea or pre-syncope.  Went to the ER , was found to have PVCs  Potassium was low. Sleeping well, more caffeine recently  Walks regularly  - walked 18 holes of  golf on Sunday , no issues with walking   03/31/18  Feeling well. BP is a bit higher today . Perhaps ate more salt than he should  No CP or dyspnea.  Has occasional Palpitations .  Has propranolol - does not take it  Snores,   Asked about OSA   September 22, 2018: Brian Parker is doing well.  BP is well controlled  has occasional PVCs  Playing golf regularly .   Walks most of the time   October 08, 2019: Brian Parker is seen today for follow-up visit.  He has a history of palpitations and has had PVCs diagnosed by EKG.  He has propanolol to take on an as-needed basis ( he has never taken)  He plays golf on a regular basis. No CP , no dyspnea    Current Outpatient Medications on File Prior to Visit  Medication Sig Dispense Refill  . amLODipine (NORVASC) 5 MG tablet Take 1 tablet (5 mg total) by mouth daily. 90 tablet 3  . aspirin EC 81 MG tablet Take 81 mg by mouth daily.      [name] doxazosin (CARDURA) 2  MG tablet Take 1 tablet (2 mg total) by mouth at bedtime. 90 tablet 2  . Fish Oil-Krill Oil CAPS Take 1 capsule by mouth daily.    . hydrochlorothiazide (HYDRODIURIL) 25 MG tablet Take 1 tablet (25 mg total) by mouth daily. 90 tablet 2  . Multiple Vitamin (MULTIVITAMIN) tablet Take 1 tablet by mouth daily.      . potassium chloride SA (K-DUR,KLOR-CON) 20 MEQ tablet Take 1 tablet (20 mEq total) by mouth daily. 90 tablet 3  . propranolol (INDERAL) 10 MG tablet Take 1 tablet (10 mg total) by mouth 4 (four) times daily as needed (palpitations or fast heart rate). 90 tablet 3  . tamsulosin (FLOMAX) 0.4 MG CAPS capsule Take 1 capsule by mouth 2 (two) times a week.    [name] lisinopril (PRINIVIL,ZESTRIL) 20 MG tablet Take 1 tablet (20 mg total) by mouth daily. 90 tablet 3   No current facility-administered medications on file prior to visit.   Amlodipine 5 mg a day   Allergies  Allergen Reactions  . Losartan     Stomach cramps.    Past Medical History:  Diagnosis Date  . Cancer of kidney, secondary  ([place]) 01/06/2010   right kidney//right kidney removed//left kidney functions WDL  . Chest pain    INTERMITTENT/15 years ago//stress induced  . Edema of lower extremity   . Elevated prostate specific antigen (PSA) 11/20/2011  . Follicular lymphoma grade I of intra-abdominal lymph nodes ([place]) 12/31/2006  . History of B-cell lymphoma 12/05/2015  . Hypertension   . Kidney carcinoma ([place]) 03/01/2003   tumor seperate from lymphoma; found during routine screening for lymphoma  . NHL (non-Hodgkin's lymphoma) ([place]) 12/06/2014  . Nodular lymphoma involving intra-abdominal lymph nodes ([place]) 12/20/2011  . Non Hodgkin's lymphoma [facility])     Past Surgical History:  Procedure Laterality Date  . APPENDECTOMY    . CARDIOVASCULAR STRESS TEST  10/19/2008   EF 52%, NO ISCHEMIA  . GROIN MASS OPEN BIOPSY    . HERNIA REPAIR  2006-2007   right laparoscopic  . HERNIA REPAIR  2006-2007   left laparoscopic  . NEPHRECTOMY     right kidney  . SHOULDER SURGERY    . TOTAL KNEE ARTHROPLASTY Left 03/25/2013   Dr Marlou Sa  . TOTAL KNEE ARTHROPLASTY Left 03/24/2013   Procedure: LEFT TOTAL KNEE ARTHROPLASTY;  Surgeon: Meredith Pel, MD;  Location: [facility];  Service: Orthopedics;  Laterality: Left;  Left Total Knee Arthroplasty  . US ECHOCARDIOGRAPHY  02/26/2006   EF 55-60%  . VENTRAL HERNIA REPAIR  2003-2004   with mesh after biopsy    Social History   Tobacco Use  Smoking Status Never Smoker  Smokeless Tobacco Never Used    Social History   Substance and Sexual Activity  Alcohol Use Yes  . Alcohol/week: 7.0 standard drinks  . Types: 6 Glasses of wine, 1 Cans of beer per week   Comment: social    Family History  Problem Relation Age of Onset  . Uterine cancer Mother   . Cervical cancer Mother   . Coronary artery disease Father   . Hypertension Father     Reviw of Systems:  Reviewed in the HPI.  All other systems are negative.  Physical Exam: Blood pressure 134/84, pulse 77, height 5\' 10"  (1.778 m),  weight 226 lb 12.8 oz (102.9 kg), SpO2 96 %.  GEN:  Well nourished, well developed in no acute distress HEENT: Normal NECK: No JVD; No carotid bruits LYMPHATICS: No lymphadenopathy CARDIAC:  RRR , no murmurs, rubs, gallops RESPIRATORY:  Clear to auscultation without rales, wheezing or rhonchi  ABDOMEN: Soft, non-tender, non-distended MUSCULOSKELETAL:  No edema; No deformity  SKIN: Warm and dry NEUROLOGIC:  Alert and oriented x 3   ECG:   OCt. 8, 2020 :   NSR at 67.   Inc. RBBB   Assessment / Plan:   1. Hypertension:     BP is ok .   Advised improving his diet .  Cont meds.   2. Premature ventricular contractions:    .very stable  Has not needed his propranolol   2. Non Hodgkins Lymphoma-   Stable    Mertie Moores, MD  10/08/2019 11:52 AM    [facility] [place],  [place], [place]  [address] Pager [phone] Phone: [phone]; Fax: [phone]

## 2019-10-08 NOTE — Patient Instructions (Signed)
Medication Instructions:  Your physician recommends that you continue on your current medications as directed. Please refer to the Current Medication list given to you today.  If you need a refill on your cardiac medications before your next appointment, please call your pharmacy.   Lab work: TODAY - basic metabolic panel  If you have labs (blood work) drawn today and your tests are completely normal, you will receive your results only by: [name] MyChart Message (if you have MyChart) OR . A paper copy in the mail If you have any lab test that is abnormal or we need to change your treatment, we will call you to review the results.   Testing/Procedures: None Ordered   Follow-Up: At [facility], you and your health needs are our priority.  As part of our continuing mission to provide you with exceptional heart care, we have created designated Provider Care Teams.  These Care Teams include your primary Cardiologist (physician) and Advanced Practice Providers (APPs -  Physician Assistants and Nurse Practitioners) who all work together to provide you with the care you need, when you need it. You will need a follow up appointment in:  1 years.  Please call our office 2 months in advance to schedule this appointment.  You may see Dr. Acie Fredrickson or one of the following Advanced Practice Providers on your designated Care Team: Richardson Dopp, PA-C [place], [place] . Daune Perch, NP

## 2019-10-09 LAB — BASIC METABOLIC PANEL
BUN/Creatinine Ratio: 17 (ref 10–24)
BUN: 18 mg/dL (ref 8–27)
CO2: 22 mmol/L (ref 20–29)
Calcium: 9.1 mg/dL (ref 8.6–10.2)
Chloride: 103 mmol/L (ref 96–106)
Creatinine, Ser: 1.09 mg/dL (ref 0.76–1.27)
GFR calc Af Amer: 81 mL/min/{1.73_m2} (ref >59)
GFR calc non Af Amer: 70 mL/min/{1.73_m2} (ref >59)
Glucose: 79 mg/dL (ref 65–99)
Potassium: 4.1 mmol/L (ref 3.5–5.2)
Sodium: 139 mmol/L (ref 134–144)

## 2019-10-23 ENCOUNTER — Other Ambulatory Visit: Payer: Self-pay | Admitting: Cardiovascular Disease

## 2019-10-23 MED ORDER — AMLODIPINE BESYLATE 5 MG PO TABS: 5.0000 mg | ORAL_TABLET | Freq: Every day | ORAL | 3 refills | Status: DC

## 2019-10-23 NOTE — Telephone Encounter (Signed)
Pt's medication was sent to pt's pharmacy as requested. Confirmation received.  °

## 2019-10-26 DIAGNOSIS — M546 Pain in thoracic spine: Secondary | ICD-10-CM | POA: Diagnosis not present

## 2019-10-26 DIAGNOSIS — M791 Myalgia, unspecified site: Secondary | ICD-10-CM | POA: Diagnosis not present

## 2019-10-26 DIAGNOSIS — M9902 Segmental and somatic dysfunction of thoracic region: Secondary | ICD-10-CM | POA: Diagnosis not present

## 2019-10-26 DIAGNOSIS — M9903 Segmental and somatic dysfunction of lumbar region: Secondary | ICD-10-CM | POA: Diagnosis not present

## 2019-10-26 DIAGNOSIS — M545 Low back pain: Secondary | ICD-10-CM | POA: Diagnosis not present

## 2019-11-06 ENCOUNTER — Ambulatory Visit: Payer: Medicare HMO | Admitting: Orthopedic Surgery

## 2019-11-16 ENCOUNTER — Other Ambulatory Visit: Payer: Self-pay

## 2019-11-16 ENCOUNTER — Encounter: Payer: Self-pay | Admitting: Orthopedic Surgery

## 2019-11-16 ENCOUNTER — Ambulatory Visit (INDEPENDENT_AMBULATORY_CARE_PROVIDER_SITE_OTHER): Payer: Medicare HMO | Admitting: Orthopedic Surgery

## 2019-11-16 DIAGNOSIS — M1711 Unilateral primary osteoarthritis, right knee: Secondary | ICD-10-CM

## 2019-11-18 ENCOUNTER — Encounter: Payer: Self-pay | Admitting: Orthopedic Surgery

## 2019-11-18 DIAGNOSIS — M1711 Unilateral primary osteoarthritis, right knee: Secondary | ICD-10-CM

## 2019-11-18 MED ORDER — METHYLPREDNISOLONE ACETATE 40 MG/ML IJ SUSP
40.0000 mg | INTRAMUSCULAR | Status: AC | PRN
  Administered 2019-11-18: 40 mg via INTRA_ARTICULAR

## 2019-11-18 MED ORDER — LIDOCAINE HCL 1 % IJ SOLN
5.0000 mL | INTRAMUSCULAR | Status: AC | PRN
  Administered 2019-11-18: 5 mL

## 2019-11-18 MED ORDER — BUPIVACAINE HCL 0.25 % IJ SOLN
4.0000 mL | INTRAMUSCULAR | Status: AC | PRN
  Administered 2019-11-18: 4 mL via INTRA_ARTICULAR

## 2019-11-18 NOTE — Progress Notes (Signed)
Office Visit Note   Patient: Brian Parker           Date of Birth: June 19, 1950           MRN: [id number] Visit Date: 11/16/2019 Requested by: Hulan Fess, MD [place],  [place] [address] PCP: Hulan Fess, MD  Subjective: Chief Complaint  Patient presents with  . Right Knee - Follow-up    HPI: Brian Parker is a patient with right knee pain.  Last injection 07/09/2019.  Did not get much relief from the injection.  Reports new onset of painful inferior patellar type symptoms.  Reports some swelling and locking.  Yesterday was very painful but today it is improved.  Gel injections lasted him 2 weeks.  Chiropractor has helped the knee some.  The symptoms behind the kneecap are new since the gel injection.  Does use a brace.  Was able to walk 18 holes of golf yesterday.  Riding stationary bike helps.  Left knee has done well with the knee replacement.              ROS: All systems reviewed are negative as they relate to the chief complaint within the history of present illness.  Patient denies  fevers or chills.   Assessment & Plan: Visit Diagnoses:  1. Arthritis of right knee     Plan: Impression is right knee pain with arthritis and possible retropatellar pain generation source which could be synovitis or small loose body.  I would favor aspiration injection today with consideration of arthroscopic debridement if needed with continued mechanical symptoms primarily.  I will see him back as needed for consideration of arthroscopic evaluation of that right knee.  Follow-Up Instructions: No follow-ups on file.   Orders:  No orders of the defined types were placed in this encounter.  No orders of the defined types were placed in this encounter.     Procedures: Large Joint Inj: R knee on 11/18/2019 12:44 PM Indications: diagnostic evaluation, joint swelling and pain Details: 18 G 1.5 in needle, superolateral approach  Arthrogram: No  Medications: 5 mL lidocaine 1 %; 40 mg  methylPREDNISolone acetate 40 MG/ML; 4 mL bupivacaine 0.25 % Outcome: tolerated well, no immediate complications Procedure, treatment alternatives, risks and benefits explained, specific risks discussed. Consent was given by the patient. Immediately prior to procedure a time out was called to verify the correct patient, procedure, equipment, support staff and site/side marked as required. Patient was prepped and draped in the usual sterile fashion.       Clinical Data: No additional findings.  Objective: Vital Signs: There were no vitals taken for this visit.  Physical Exam:   Constitutional: Patient appears well-developed HEENT:  Head: Normocephalic Eyes:EOM are normal Neck: Normal range of motion Cardiovascular: Normal rate Pulmonary/chest: Effort normal Neurologic: Patient is alert Skin: Skin is warm Psychiatric: Patient has normal mood and affect    Ortho Exam: Ortho exam demonstrates full active and passive range of motion of that extensor mechanism is intact.  Mild effusion is present.  Mild joint line tenderness is present.  No masses lymphadenopathy or skin changes noted in that right knee region.  Does have full extension and flexion to about 120.  Specialty Comments:  No specialty comments available.  Imaging: No results found.   PMFS History: Patient Active Problem List   Diagnosis Date Noted  . PVC (premature ventricular contraction) 02/26/2017  . History of B-cell lymphoma 12/05/2015  . History of skin cancer 12/05/2015  .  Preventive measure 12/06/2014  . Elevated prostate specific antigen (PSA) 11/20/2011  . Essential hypertension 11/09/2011  . History of kidney cancer 03/01/2003   Past Medical History:  Diagnosis Date  . Cancer of kidney, secondary ([place]) 01/06/2010   right kidney//right kidney removed//left kidney functions WDL  . Chest pain    INTERMITTENT/15 years ago//stress induced  . Edema of lower extremity   . Elevated prostate specific  antigen (PSA) 11/20/2011  . Follicular lymphoma grade I of intra-abdominal lymph nodes ([place]) 12/31/2006  . History of B-cell lymphoma 12/05/2015  . Hypertension   . Kidney carcinoma ([place]) 03/01/2003   tumor seperate from lymphoma; found during routine screening for lymphoma  . NHL (non-Hodgkin's lymphoma) ([place]) 12/06/2014  . Nodular lymphoma involving intra-abdominal lymph nodes ([place]) 12/20/2011  . Non Hodgkin's lymphoma ([place])     Family History  Problem Relation Age of Onset  . Uterine cancer Mother   . Cervical cancer Mother   . Coronary artery disease Father   . Hypertension Father     Past Surgical History:  Procedure Laterality Date  . APPENDECTOMY    . CARDIOVASCULAR STRESS TEST  10/19/2008   EF 52%, NO ISCHEMIA  . GROIN MASS OPEN BIOPSY    . HERNIA REPAIR  2006-2007   right laparoscopic  . HERNIA REPAIR  2006-2007   left laparoscopic  . NEPHRECTOMY     right kidney  . SHOULDER SURGERY    . TOTAL KNEE ARTHROPLASTY Left 03/25/2013   Dr Marlou Sa  . TOTAL KNEE ARTHROPLASTY Left 03/24/2013   Procedure: LEFT TOTAL KNEE ARTHROPLASTY;  Surgeon: Meredith Pel, MD;  Location: [facility];  Service: Orthopedics;  Laterality: Left;  Left Total Knee Arthroplasty  . US ECHOCARDIOGRAPHY  02/26/2006   EF 55-60%  . VENTRAL HERNIA REPAIR  2003-2004   with mesh after biopsy   Social History   Occupational History  . Not on file  Tobacco Use  . Smoking status: Never Smoker  . Smokeless tobacco: Never Used  Substance and Sexual Activity  . Alcohol use: Yes    Alcohol/week: 7.0 standard drinks    Types: 6 Glasses of wine, 1 Cans of beer per week    Comment: social  . Drug use: No  . Sexual activity: Not on file

## 2019-12-07 ENCOUNTER — Other Ambulatory Visit: Payer: Self-pay

## 2019-12-07 ENCOUNTER — Inpatient Hospital Stay: Payer: Medicare HMO | Admitting: Hematology and Oncology

## 2019-12-07 ENCOUNTER — Inpatient Hospital Stay: Payer: Medicare HMO | Attending: Hematology and Oncology

## 2019-12-07 ENCOUNTER — Other Ambulatory Visit: Payer: Self-pay | Admitting: Hematology and Oncology

## 2019-12-07 VITALS — BP 147/85 | HR 76 | Temp 98.0°F | Resp 18 | Ht 70.0 in | Wt 227.7 lb

## 2019-12-07 DIAGNOSIS — E669 Obesity, unspecified: Secondary | ICD-10-CM | POA: Diagnosis not present

## 2019-12-07 DIAGNOSIS — Z85528 Personal history of other malignant neoplasm of kidney: Secondary | ICD-10-CM

## 2019-12-07 DIAGNOSIS — Z8572 Personal history of non-Hodgkin lymphomas: Secondary | ICD-10-CM | POA: Diagnosis not present

## 2019-12-07 DIAGNOSIS — Z23 Encounter for immunization: Secondary | ICD-10-CM

## 2019-12-07 DIAGNOSIS — Z905 Acquired absence of kidney: Secondary | ICD-10-CM | POA: Insufficient documentation

## 2019-12-07 DIAGNOSIS — C8208 Follicular lymphoma grade I, lymph nodes of multiple sites: Secondary | ICD-10-CM | POA: Diagnosis not present

## 2019-12-07 LAB — COMPREHENSIVE METABOLIC PANEL
ALT: 22 U/L (ref 0–44)
AST: 24 U/L (ref 15–41)
Albumin: 4.1 g/dL (ref 3.5–5.0)
Alkaline Phosphatase: 68 U/L (ref 38–126)
Anion gap: 8 (ref 5–15)
BUN: 18 mg/dL (ref 8–23)
CO2: 27 mmol/L (ref 22–32)
Calcium: 8.8 mg/dL — ABNORMAL LOW (ref 8.9–10.3)
Chloride: 105 mmol/L (ref 98–111)
Creatinine, Ser: 1.05 mg/dL (ref 0.61–1.24)
GFR calc Af Amer: >60 mL/min (ref >60)
GFR calc non Af Amer: >60 mL/min (ref >60)
Glucose, Bld: 98 mg/dL (ref 70–99)
Potassium: 3.9 mmol/L (ref 3.5–5.1)
Sodium: 140 mmol/L (ref 135–145)
Total Bilirubin: 0.7 mg/dL (ref 0.3–1.2)
Total Protein: 6.4 g/dL — ABNORMAL LOW (ref 6.5–8.1)

## 2019-12-07 LAB — CBC WITH DIFFERENTIAL/PLATELET
Abs Immature Granulocytes: 0.02 10*3/uL (ref 0.00–0.07)
Basophils Absolute: 0 10*3/uL (ref 0.0–0.1)
Basophils Relative: 1 %
Eosinophils Absolute: 0.2 10*3/uL (ref 0.0–0.5)
Eosinophils Relative: 3 %
HCT: 47.1 % (ref 39.0–52.0)
Hemoglobin: 15.8 g/dL (ref 13.0–17.0)
Immature Granulocytes: 0 %
Lymphocytes Relative: 21 %
Lymphs Abs: 1 10*3/uL (ref 0.7–4.0)
MCH: 30.3 pg (ref 26.0–34.0)
MCHC: 33.5 g/dL (ref 30.0–36.0)
MCV: 90.2 fL (ref 80.0–100.0)
Monocytes Absolute: 0.4 10*3/uL (ref 0.1–1.0)
Monocytes Relative: 7 %
Neutro Abs: 3.3 10*3/uL (ref 1.7–7.7)
Neutrophils Relative %: 68 %
Platelets: 153 10*3/uL (ref 150–400)
RBC: 5.22 MIL/uL (ref 4.22–5.81)
RDW: 13.2 % (ref 11.5–15.5)
WBC: 4.9 10*3/uL (ref 4.0–10.5)
nRBC: 0 % (ref 0.0–0.2)

## 2019-12-07 MED ORDER — INFLUENZA VAC A&B SA ADJ QUAD 0.5 ML IM PRSY
0.5000 mL | PREFILLED_SYRINGE | Freq: Once | INTRAMUSCULAR | Status: AC
  Administered 2019-12-07: 0.5 mL via INTRAMUSCULAR

## 2019-12-07 MED ORDER — INFLUENZA VAC A&B SA ADJ QUAD 0.5 ML IM PRSY
PREFILLED_SYRINGE | INTRAMUSCULAR | Status: AC
  Filled 2019-12-07: qty 0.5

## 2019-12-08 ENCOUNTER — Encounter: Payer: Self-pay | Admitting: Hematology and Oncology

## 2019-12-08 ENCOUNTER — Telehealth: Payer: Self-pay | Admitting: Hematology and Oncology

## 2019-12-08 DIAGNOSIS — E669 Obesity, unspecified: Secondary | ICD-10-CM | POA: Insufficient documentation

## 2019-12-08 NOTE — Assessment & Plan Note (Signed)
He has no signs or symptoms to suggest cancer recurrence We will continue close observation with history and physical examination once a year I do not recommend surveillance imaging study The patient is educated to watch for signs and symptoms of cancer recurrence  We discussed the importance of preventive care and reviewed the vaccination programs. He does not have any prior allergic reactions to influenza vaccination. He agrees to proceed with influenza vaccination today and we will administer it today at the clinic.

## 2019-12-08 NOTE — Assessment & Plan Note (Signed)
The patient have cardiovascular risk factors We have extensive discussion about dietary management and lifestyle modification He appears motivated to lose weight

## 2019-12-08 NOTE — Assessment & Plan Note (Signed)
He continues close follow-up with urologist His renal function has been stable

## 2019-12-08 NOTE — Progress Notes (Signed)
[facility] OFFICE PROGRESS NOTE  Patient Care Team: Hulan Fess, MD as PCP - General (Family Medicine) Arloa Koh, MD (Inactive) as Consulting Physician (Radiation Oncology) Kathie Rhodes, MD as Consulting Physician (Urology) Heath Lark, MD as Consulting Physician (Hematology and Oncology)  ASSESSMENT & PLAN:  NHL (non-Hodgkin's lymphoma) [facility]) He has no signs or symptoms to suggest cancer recurrence We will continue close observation with history and physical examination once a year I do not recommend surveillance imaging study The patient is educated to watch for signs and symptoms of cancer recurrence  We discussed the importance of preventive care and reviewed the vaccination programs. He does not have any prior allergic reactions to influenza vaccination. He agrees to proceed with influenza vaccination today and we will administer it today at the clinic.   History of kidney cancer He continues close follow-up with urologist His renal function has been stable  Class 1 obesity The patient have cardiovascular risk factors We have extensive discussion about dietary management and lifestyle modification He appears motivated to lose weight   No orders of the defined types were placed in this encounter.   INTERVAL HISTORY: Please see below for problem oriented charting. He returns for lymphoma follow-up He is doing well He has gained quite a bit of weight since last time I saw him and is bothered by abdominal bulging at the site of his prior nephrectomy He denies new lymphadenopathy No recent infection, fever or chills He is due for influenza vaccination today He is interested in shingles vaccine and also potentially Covid vaccine once it becomes available He is active in activities of daily living and is motivated to lose some weight before I see him next time   SUMMARY OF ONCOLOGIC HISTORY: Oncology History  NHL (non-Hodgkin's lymphoma) ([place])  (Resolved)  12/31/2001 Initial Diagnosis   He was initially diagnosed with a follicular grade 1,  B-cell, non-Hodgkin's lymphoma in 2003 .  He has also been diagnosed with metachronous primary tumors of his right kidney and ultimately underwent initial partial nephrectomy in March 2004 then a completion nephrectomy in January 2011. He was treated with 8 cycles of CHOP/Rituxan through July 2003. He was started on maintenance rituximab but only had 2 cycles due to development of delayed neutropenia.  There was suspicion for early progression on a CT scan done in July of 2009 which showed a single area of soft tissue density in the left periaortic region. This area remained stable on serial scans until a study done on 11/13/2011 which did show clear regrowth in this area enlarging from 2.6 x 0.9 cm to approximately 4.9 x 3.4 cm. A PET scan was done on 11/30/2011 which showed a single area of markedly abnormal activity with SUV up to 10.3 over the abnormal area of lymphadenopathy seen on CT scan. There were no other areas of activity.  He received 3000 cGy in 15 fractions to the left para-aortic lymph node mass between January 16 and 02/04/2012.    12/10/2017 Imaging   1. Stable CT of the abdomen and pelvis. No evidence for recurrent lymphoma. 2. Status post right nephrectomy. No evidence for local tumor recurrence or metastatic disease. 3. Status post repair of ventral abdominal wall hernia.     REVIEW OF SYSTEMS:   Constitutional: Denies fevers, chills or abnormal weight loss Eyes: Denies blurriness of vision Ears, nose, mouth, throat, and face: Denies mucositis or sore throat Respiratory: Denies cough, dyspnea or wheezes Cardiovascular: Denies palpitation, chest discomfort or lower  extremity swelling Gastrointestinal:  Denies nausea, heartburn or change in bowel habits Skin: Denies abnormal skin rashes Lymphatics: Denies new lymphadenopathy or easy bruising Neurological:Denies numbness, tingling  or new weaknesses Behavioral/Psych: Mood is stable, no new changes  All other systems were reviewed with the patient and are negative.  I have reviewed the past medical history, past surgical history, social history and family history with the patient and they are unchanged from previous note.  ALLERGIES:  is allergic to losartan.  MEDICATIONS:  Current Outpatient Medications  Medication Sig Dispense Refill  . amLODipine (NORVASC) 5 MG tablet Take 1 tablet (5 mg total) by mouth daily. 90 tablet 3  . aspirin EC 81 MG tablet Take 81 mg by mouth daily.      [name] doxazosin (CARDURA) 2 MG tablet Take 1 tablet (2 mg total) by mouth at bedtime. 90 tablet 2  . Fish Oil-Krill Oil CAPS Take 1 capsule by mouth daily.    . hydrochlorothiazide (HYDRODIURIL) 25 MG tablet Take 1 tablet (25 mg total) by mouth daily. 90 tablet 2  . lisinopril (PRINIVIL,ZESTRIL) 20 MG tablet Take 1 tablet (20 mg total) by mouth daily. 90 tablet 3  . Multiple Vitamin (MULTIVITAMIN) tablet Take 1 tablet by mouth daily.      . potassium chloride SA (K-DUR,KLOR-CON) 20 MEQ tablet Take 1 tablet (20 mEq total) by mouth daily. 90 tablet 3  . propranolol (INDERAL) 10 MG tablet Take 1 tablet (10 mg total) by mouth 4 (four) times daily as needed (palpitations or fast heart rate). 90 tablet 3  . tamsulosin (FLOMAX) 0.4 MG CAPS capsule Take 1 capsule by mouth 2 (two) times a week.     No current facility-administered medications for this visit.     PHYSICAL EXAMINATION: ECOG PERFORMANCE STATUS: 0 - Asymptomatic  Vitals:   12/07/19 1207  BP: (!) 147/85  Pulse: 76  Resp: 18  Temp: 98 F (36.7 C)  SpO2: 96%   Filed Weights   12/07/19 1207  Weight: 227 lb 11.2 oz (103.3 kg)    GENERAL:alert, no distress and comfortable SKIN: skin color, texture, turgor are normal, no rashes or significant lesions EYES: normal, Conjunctiva are pink and non-injected, sclera clear OROPHARYNX:no exudate, no erythema and lips, buccal mucosa, and  tongue normal  NECK: supple, thyroid normal size, non-tender, without nodularity LYMPH:  no palpable lymphadenopathy in the cervical, axillary or inguinal LUNGS: clear to auscultation and percussion with normal breathing effort HEART: regular rate & rhythm and no murmurs and no lower extremity edema ABDOMEN:abdomen soft, non-tender and normal bowel sounds.  Abdominal hernia is noted Musculoskeletal:no cyanosis of digits and no clubbing  NEURO: alert & oriented x 3 with fluent speech, no focal motor/sensory deficits  LABORATORY DATA:  I have reviewed the data as listed    Component Value Date/Time   NA 140 12/07/2019 1135   NA 139 10/08/2019 1219   NA 141 12/05/2017 1013   K 3.9 12/07/2019 1135   K 3.9 12/05/2017 1013   CL 105 12/07/2019 1135   CL 105 03/02/2013 0818   CO2 27 12/07/2019 1135   CO2 27 12/05/2017 1013   GLUCOSE 98 12/07/2019 1135   GLUCOSE 84 12/05/2017 1013   GLUCOSE 88 03/02/2013 0818   BUN 18 12/07/2019 1135   BUN 18 10/08/2019 1219   BUN 16.9 12/05/2017 1013   CREATININE 1.05 12/07/2019 1135   CREATININE 1.1 12/05/2017 1013   CALCIUM 8.8 (L) 12/07/2019 1135   CALCIUM 9.3 12/05/2017 1013  PROT 6.4 (L) 12/07/2019 1135   PROT 7.0 12/05/2017 1013   ALBUMIN 4.1 12/07/2019 1135   ALBUMIN 4.4 12/05/2017 1013   AST 24 12/07/2019 1135   AST 20 12/05/2017 1013   ALT 22 12/07/2019 1135   ALT 18 12/05/2017 1013   ALKPHOS 68 12/07/2019 1135   ALKPHOS 71 12/05/2017 1013   BILITOT 0.7 12/07/2019 1135   BILITOT 0.75 12/05/2017 1013   GFRNONAA >60 12/07/2019 1135   GFRAA >60 12/07/2019 1135    No results found for: SPEP, UPEP  Lab Results  Component Value Date   WBC 4.9 12/07/2019   NEUTROABS 3.3 12/07/2019   HGB 15.8 12/07/2019   HCT 47.1 12/07/2019   MCV 90.2 12/07/2019   PLT 153 12/07/2019      Chemistry      Component Value Date/Time   NA 140 12/07/2019 1135   NA 139 10/08/2019 1219   NA 141 12/05/2017 1013   K 3.9 12/07/2019 1135   K 3.9  12/05/2017 1013   CL 105 12/07/2019 1135   CL 105 03/02/2013 0818   CO2 27 12/07/2019 1135   CO2 27 12/05/2017 1013   BUN 18 12/07/2019 1135   BUN 18 10/08/2019 1219   BUN 16.9 12/05/2017 1013   CREATININE 1.05 12/07/2019 1135   CREATININE 1.1 12/05/2017 1013      Component Value Date/Time   CALCIUM 8.8 (L) 12/07/2019 1135   CALCIUM 9.3 12/05/2017 1013   ALKPHOS 68 12/07/2019 1135   ALKPHOS 71 12/05/2017 1013   AST 24 12/07/2019 1135   AST 20 12/05/2017 1013   ALT 22 12/07/2019 1135   ALT 18 12/05/2017 1013   BILITOT 0.7 12/07/2019 1135   BILITOT 0.75 12/05/2017 1013      All questions were answered. The patient knows to call the clinic with any problems, questions or concerns. No barriers to learning was detected.  I spent 15 minutes counseling the patient face to face. The total time spent in the appointment was 20 minutes and more than 50% was on counseling and review of test results  Heath Lark, MD 12/08/2019 8:53 AM

## 2019-12-08 NOTE — Telephone Encounter (Signed)
Scheduled appt per 12/7 sch message - mailed reminder letter with appt date and time

## 2020-01-20 ENCOUNTER — Other Ambulatory Visit: Payer: Self-pay | Admitting: Cardiovascular Disease

## 2020-01-21 DIAGNOSIS — L573 Poikiloderma of Civatte: Secondary | ICD-10-CM | POA: Diagnosis not present

## 2020-01-21 DIAGNOSIS — D487 Neoplasm of uncertain behavior of other specified sites: Secondary | ICD-10-CM | POA: Diagnosis not present

## 2020-01-21 DIAGNOSIS — D225 Melanocytic nevi of trunk: Secondary | ICD-10-CM | POA: Diagnosis not present

## 2020-01-21 DIAGNOSIS — L720 Epidermal cyst: Secondary | ICD-10-CM | POA: Diagnosis not present

## 2020-01-21 DIAGNOSIS — D485 Neoplasm of uncertain behavior of skin: Secondary | ICD-10-CM | POA: Diagnosis not present

## 2020-01-21 DIAGNOSIS — B351 Tinea unguium: Secondary | ICD-10-CM | POA: Diagnosis not present

## 2020-01-21 DIAGNOSIS — D2261 Melanocytic nevi of right upper limb, including shoulder: Secondary | ICD-10-CM | POA: Diagnosis not present

## 2020-01-21 DIAGNOSIS — D2272 Melanocytic nevi of left lower limb, including hip: Secondary | ICD-10-CM | POA: Diagnosis not present

## 2020-01-21 DIAGNOSIS — L821 Other seborrheic keratosis: Secondary | ICD-10-CM | POA: Diagnosis not present

## 2020-01-21 DIAGNOSIS — D220 Melanocytic nevi of lip: Secondary | ICD-10-CM | POA: Diagnosis not present

## 2020-01-21 DIAGNOSIS — L738 Other specified follicular disorders: Secondary | ICD-10-CM | POA: Diagnosis not present

## 2020-01-23 ENCOUNTER — Other Ambulatory Visit: Payer: Self-pay

## 2020-01-25 ENCOUNTER — Other Ambulatory Visit: Payer: Self-pay

## 2020-01-26 MED ORDER — POTASSIUM CHLORIDE CRYS ER 20 MEQ PO TBCR: 20.0000 meq | EXTENDED_RELEASE_TABLET | Freq: Every day | ORAL | 2 refills | Status: DC

## 2020-01-27 ENCOUNTER — Other Ambulatory Visit: Payer: Self-pay

## 2020-01-27 ENCOUNTER — Ambulatory Visit: Payer: Medicare HMO | Admitting: Orthopedic Surgery

## 2020-01-27 ENCOUNTER — Encounter: Payer: Self-pay | Admitting: Orthopedic Surgery

## 2020-01-27 ENCOUNTER — Other Ambulatory Visit: Payer: Self-pay | Admitting: Surgical

## 2020-01-27 DIAGNOSIS — R972 Elevated prostate specific antigen [PSA]: Secondary | ICD-10-CM | POA: Diagnosis not present

## 2020-01-27 DIAGNOSIS — M1711 Unilateral primary osteoarthritis, right knee: Secondary | ICD-10-CM | POA: Diagnosis not present

## 2020-01-27 NOTE — Progress Notes (Signed)
Office Visit Note   Patient: Brian Parker           Date of Birth: January 26, 1950           MRN: [id number] Visit Date: 01/27/2020 Requested by: Hulan Fess, MD [place],  [place] [address] PCP: Hulan Fess, MD  Subjective: Chief Complaint  Patient presents with  . Right Knee - Pain    HPI: Brian Parker is a 70 y.o. male who presents to the office complaining of right knee pain.  Patient returns to discuss right knee pain.  He was last seen for this problem on 11/16/2019.  He had a right knee aspiration injection that helped for 6 weeks.  He notes right knee pain both medially and laterally as well as lateral/anterior calf pain.  He is not taking any medication for this problem aside from occasional half ibuprofen.  He notes the calf pain has been going on for about 6 months.  He does think that the calf pain went away after the knee injection and came back when his knee pain returned as well, but he is not 100% sure about this.  He denies any groin pain or low back pain.  This pain does wake him up at night occasionally up to 2-3 times per night.  He has a history of lymphoma and renal cell carcinoma and is left with only one kidney so he tries not to take any NSAIDs.  He plays golf multiple times a week and walks for exercise..                ROS:  All systems reviewed are negative as they relate to the chief complaint within the history of present illness.  Patient denies fevers or chills.  Assessment & Plan: Visit Diagnoses:  1. Arthritis of right knee     Plan: Patient is a 70 year old male who presents complaining of right knee pain.  He has a history of right knee arthritis.  Last knee injection was on 11/16/2019 that provided 6 weeks of relief.  Today he presents complaining of similar right knee pain as well as right lateral/anterior calf pain.  To his recollection, it seems that the calf pain resolved on the injection previously but he is not 100% sure about this.   Discussed options available to patient.  Settled on right knee aspiration injection today with cortisone.  He has had no luck with gel injections in the past.  He tolerated the procedure well.  He will pay attention to how this injection affects his calf pain.  If he has no relief from the injection, we will consider MRI scan of right tib-fib and MRI lumbar spine for further evaluation.  Patient agrees with plan.  Follow-Up Instructions: No follow-ups on file.   Orders:  No orders of the defined types were placed in this encounter.  No orders of the defined types were placed in this encounter.     Procedures: Large Joint Inj: R knee on 01/31/2020 10:22 PM Indications: diagnostic evaluation, joint swelling and pain Details: 18 G 1.5 in needle, superolateral approach  Arthrogram: No  Medications: 5 mL lidocaine 1 %; 40 mg methylPREDNISolone acetate 40 MG/ML; 4 mL bupivacaine 0.25 % Outcome: tolerated well, no immediate complications Procedure, treatment alternatives, risks and benefits explained, specific risks discussed. Consent was given by the patient. Immediately prior to procedure a time out was called to verify the correct patient, procedure, equipment, support staff and site/side marked  as required. Patient was prepped and draped in the usual sterile fashion.       Clinical Data: No additional findings.  Objective: Vital Signs: There were no vitals taken for this visit.  Physical Exam:  Constitutional: Patient appears well-developed HEENT:  Head: Normocephalic Eyes:EOM are normal Neck: Normal range of motion Cardiovascular: Normal rate Pulmonary/chest: Effort normal Neurologic: Patient is alert Skin: Skin is warm Psychiatric: Patient has normal mood and affect  Ortho Exam:  Right knee Exam Significant effusion Extensor mechanism intact No TTP over the quad tendon, patellar tendon, pes anserinus, patella, tibial tubercle, LCL/MCL insertions Tender to palpation  over the medial and lateral joint lines.  Tender to palpation throughout the tibial shaft. Stable to varus/valgus stresses.  Stable to anterior/posterior drawer Extension to 0 degrees Flexion > 90 degrees  Specialty Comments:  No specialty comments available.  Imaging: No results found.   PMFS History: Patient Active Problem List   Diagnosis Date Noted  . Class 1 obesity 12/08/2019  . PVC (premature ventricular contraction) 02/26/2017  . History of B-cell lymphoma 12/05/2015  . History of skin cancer 12/05/2015  . Preventive measure 12/06/2014  . Elevated prostate specific antigen (PSA) 11/20/2011  . Essential hypertension 11/09/2011  . History of kidney cancer 03/01/2003   Past Medical History:  Diagnosis Date  . Cancer of kidney, secondary ([place]) 01/06/2010   right kidney//right kidney removed//left kidney functions WDL  . Chest pain    INTERMITTENT/15 years ago//stress induced  . Edema of lower extremity   . Elevated prostate specific antigen (PSA) 11/20/2011  . Follicular lymphoma grade I of intra-abdominal lymph nodes ([place]) 12/31/2006  . History of B-cell lymphoma 12/05/2015  . Hypertension   . Kidney carcinoma ([place]) 03/01/2003   tumor seperate from lymphoma; found during routine screening for lymphoma  . NHL (non-Hodgkin's lymphoma) ([place]) 12/06/2014  . Nodular lymphoma involving intra-abdominal lymph nodes ([place]) 12/20/2011  . Non Hodgkin's lymphoma ([place])     Family History  Problem Relation Age of Onset  . Uterine cancer Mother   . Cervical cancer Mother   . Coronary artery disease Father   . Hypertension Father     Past Surgical History:  Procedure Laterality Date  . APPENDECTOMY    . CARDIOVASCULAR STRESS TEST  10/19/2008   EF 52%, NO ISCHEMIA  . GROIN MASS OPEN BIOPSY    . HERNIA REPAIR  2006-2007   right laparoscopic  . HERNIA REPAIR  2006-2007   left laparoscopic  . NEPHRECTOMY     right kidney  . SHOULDER SURGERY    . TOTAL KNEE ARTHROPLASTY Left  03/25/2013   Dr Marlou Sa  . TOTAL KNEE ARTHROPLASTY Left 03/24/2013   Procedure: LEFT TOTAL KNEE ARTHROPLASTY;  Surgeon: Meredith Pel, MD;  Location: [facility];  Service: Orthopedics;  Laterality: Left;  Left Total Knee Arthroplasty  . US ECHOCARDIOGRAPHY  02/26/2006   EF 55-60%  . VENTRAL HERNIA REPAIR  2003-2004   with mesh after biopsy   Social History   Occupational History  . Not on file  Tobacco Use  . Smoking status: Never Smoker  . Smokeless tobacco: Never Used  Substance and Sexual Activity  . Alcohol use: Yes    Alcohol/week: 7.0 standard drinks    Types: 6 Glasses of wine, 1 Cans of beer per week    Comment: social  . Drug use: No  . Sexual activity: Not on file

## 2020-01-31 ENCOUNTER — Encounter: Payer: Self-pay | Admitting: Orthopedic Surgery

## 2020-01-31 DIAGNOSIS — M1711 Unilateral primary osteoarthritis, right knee: Secondary | ICD-10-CM | POA: Diagnosis not present

## 2020-01-31 MED ORDER — METHYLPREDNISOLONE ACETATE 40 MG/ML IJ SUSP
40.0000 mg | INTRAMUSCULAR | Status: AC | PRN
  Administered 2020-01-31: 40 mg via INTRA_ARTICULAR

## 2020-01-31 MED ORDER — LIDOCAINE HCL 1 % IJ SOLN
5.0000 mL | INTRAMUSCULAR | Status: AC | PRN
  Administered 2020-01-31: 5 mL

## 2020-01-31 MED ORDER — BUPIVACAINE HCL 0.25 % IJ SOLN
4.0000 mL | INTRAMUSCULAR | Status: AC | PRN
  Administered 2020-01-31: 22:00:00 4 mL via INTRA_ARTICULAR

## 2020-02-11 ENCOUNTER — Encounter: Payer: Self-pay | Admitting: Hematology and Oncology

## 2020-03-07 ENCOUNTER — Other Ambulatory Visit: Payer: Self-pay

## 2020-03-07 ENCOUNTER — Other Ambulatory Visit: Payer: Self-pay | Admitting: Cardiovascular Disease

## 2020-03-07 MED ORDER — HYDROCHLOROTHIAZIDE 25 MG PO TABS: 25.0000 mg | ORAL_TABLET | Freq: Every day | ORAL | 3 refills | Status: DC

## 2020-04-28 ENCOUNTER — Encounter: Payer: Self-pay | Admitting: Hematology and Oncology

## 2020-04-29 ENCOUNTER — Telehealth: Payer: Self-pay

## 2020-04-29 NOTE — Telephone Encounter (Signed)
-----   Message from Heath Lark, MD sent at 04/29/2020 10:04 AM EDT ----- Regarding: pls schedule

## 2020-04-29 NOTE — Telephone Encounter (Signed)
Called and scheduled appt for Monday at 1240. He is aware of times.

## 2020-05-02 ENCOUNTER — Other Ambulatory Visit: Payer: Self-pay

## 2020-05-02 ENCOUNTER — Inpatient Hospital Stay: Payer: Medicare HMO | Attending: Hematology and Oncology | Admitting: Hematology and Oncology

## 2020-05-02 ENCOUNTER — Inpatient Hospital Stay: Payer: Medicare HMO

## 2020-05-02 ENCOUNTER — Encounter: Payer: Self-pay | Admitting: Hematology and Oncology

## 2020-05-02 VITALS — BP 132/80 | HR 60 | Temp 98.5°F | Resp 18 | Ht 70.0 in | Wt 231.4 lb

## 2020-05-02 DIAGNOSIS — Z79899 Other long term (current) drug therapy: Secondary | ICD-10-CM | POA: Diagnosis not present

## 2020-05-02 DIAGNOSIS — C8208 Follicular lymphoma grade I, lymph nodes of multiple sites: Secondary | ICD-10-CM | POA: Diagnosis not present

## 2020-05-02 DIAGNOSIS — R1011 Right upper quadrant pain: Secondary | ICD-10-CM | POA: Diagnosis present

## 2020-05-02 DIAGNOSIS — Z85828 Personal history of other malignant neoplasm of skin: Secondary | ICD-10-CM

## 2020-05-02 DIAGNOSIS — Z85528 Personal history of other malignant neoplasm of kidney: Secondary | ICD-10-CM | POA: Insufficient documentation

## 2020-05-02 DIAGNOSIS — Z8572 Personal history of non-Hodgkin lymphomas: Secondary | ICD-10-CM | POA: Insufficient documentation

## 2020-05-02 DIAGNOSIS — E669 Obesity, unspecified: Secondary | ICD-10-CM | POA: Diagnosis not present

## 2020-05-02 LAB — COMPREHENSIVE METABOLIC PANEL
ALT: 17 U/L (ref 0–44)
AST: 19 U/L (ref 15–41)
Albumin: 4 g/dL (ref 3.5–5.0)
Alkaline Phosphatase: 60 U/L (ref 38–126)
Anion gap: 12 (ref 5–15)
BUN: 17 mg/dL (ref 8–23)
CO2: 27 mmol/L (ref 22–32)
Calcium: 9.1 mg/dL (ref 8.9–10.3)
Chloride: 105 mmol/L (ref 98–111)
Creatinine, Ser: 1.01 mg/dL (ref 0.61–1.24)
GFR calc Af Amer: >60 mL/min (ref >60)
GFR calc non Af Amer: >60 mL/min (ref >60)
Glucose, Bld: 112 mg/dL — ABNORMAL HIGH (ref 70–99)
Potassium: 3.6 mmol/L (ref 3.5–5.1)
Sodium: 144 mmol/L (ref 135–145)
Total Bilirubin: 0.8 mg/dL (ref 0.3–1.2)
Total Protein: 6.3 g/dL — ABNORMAL LOW (ref 6.5–8.1)

## 2020-05-02 LAB — CBC WITH DIFFERENTIAL/PLATELET
Abs Immature Granulocytes: 0.01 10*3/uL (ref 0.00–0.07)
Basophils Absolute: 0 10*3/uL (ref 0.0–0.1)
Basophils Relative: 0 %
Eosinophils Absolute: 0.2 10*3/uL (ref 0.0–0.5)
Eosinophils Relative: 4 %
HCT: 46.6 % (ref 39.0–52.0)
Hemoglobin: 15.4 g/dL (ref 13.0–17.0)
Immature Granulocytes: 0 %
Lymphocytes Relative: 19 %
Lymphs Abs: 0.9 10*3/uL (ref 0.7–4.0)
MCH: 30 pg (ref 26.0–34.0)
MCHC: 33 g/dL (ref 30.0–36.0)
MCV: 90.7 fL (ref 80.0–100.0)
Monocytes Absolute: 0.3 10*3/uL (ref 0.1–1.0)
Monocytes Relative: 7 %
Neutro Abs: 3.4 10*3/uL (ref 1.7–7.7)
Neutrophils Relative %: 70 %
Platelets: 172 10*3/uL (ref 150–400)
RBC: 5.14 MIL/uL (ref 4.22–5.81)
RDW: 13.1 % (ref 11.5–15.5)
WBC: 4.8 10*3/uL (ref 4.0–10.5)
nRBC: 0 % (ref 0.0–0.2)

## 2020-05-02 LAB — LACTATE DEHYDROGENASE: LDH: 193 U/L — ABNORMAL HIGH (ref 98–192)

## 2020-05-02 LAB — T4, FREE: Free T4: 0.91 ng/dL (ref 0.61–1.12)

## 2020-05-02 LAB — TSH: TSH: 0.964 u[IU]/mL (ref 0.320–4.118)

## 2020-05-02 NOTE — Assessment & Plan Note (Addendum)
His examination is benign However, given his prior history of lymphoma, kidney cancer and skin cancer and nonspecific abdominal discomfort, along with LDH elevation, I recommend imaging of the abdomen & pelvis without IV contrast, but with oral contrast for further evaluation and he is in agreement

## 2020-05-02 NOTE — Progress Notes (Signed)
[facility] OFFICE PROGRESS NOTE  Patient Care Team: Brian Fess, MD as PCP - General (Family Medicine) Brian Koh, MD (Inactive) as Consulting Physician (Radiation Oncology) Brian Rhodes, MD as Consulting Physician (Urology) Brian Lark, MD as Consulting Physician (Hematology and Oncology)  ASSESSMENT & PLAN:  History of B-cell lymphoma His examination is benign However, given his prior history of lymphoma, kidney cancer and skin cancer and nonspecific abdominal discomfort, along with LDH elevation, I recommend imaging of the abdomen & pelvis without IV contrast, but with oral contrast for further evaluation and he is in agreement  History of kidney cancer His renal due to his nonspecific complaint, I recommend we proceed with CT imaging  Class 1 obesity We had extensive discussions about weight loss strategies and dietary modification   Orders Placed This Encounter  Procedures  . CT Abdomen Pelvis Wo Contrast    Standing Status:   Future    Standing Expiration Date:   05/02/2021    Order Specific Question:   Preferred imaging location?    Answer:   [facility]    Order Specific Question:   Is Oral Contrast requested for this exam?    Answer:   Yes, Per Radiology protocol    Order Specific Question:   Radiology Contrast Protocol - do NOT remove file path    Answer:   [email or web address]  . Comprehensive metabolic panel    Standing Status:   Future    Number of Occurrences:   1    Standing Expiration Date:   06/06/2021  . CBC with Differential/Platelet    Standing Status:   Future    Number of Occurrences:   1    Standing Expiration Date:   06/06/2021  . Lactate dehydrogenase    Standing Status:   Future    Number of Occurrences:   1    Standing Expiration Date:   05/02/2021  . TSH    Standing Status:   Future    Number of Occurrences:   1    Standing Expiration Date:   06/06/2021  . T4, free    Standing Status:   Future    Number of Occurrences:   1    Standing Expiration Date:   06/06/2021    All questions were answered. The patient knows to call the clinic with any problems, questions or concerns. The total time spent in the appointment was 30 minutes encounter with patients including review of chart and various tests results, discussions about plan of care and coordination of care plan   Brian Lark, MD 05/02/2020 3:11 PM  INTERVAL HISTORY: Please see below for problem oriented charting. He is seen some nonspecific abdominal discomfort He started to have some left-sided intermittent flank discomfort that comes and goes He denies recent changes in bowel habits He also have intermittent right upper quadrant discomfort He denies abnormal changes in appetite or weight loss In fact, he has gained some weight since our last discussion due to lack of physical activity He was very interested to lose some weight He subscribes to [employer] several months ago and managed to get his weight down to about 215 pounds He has been tracking his oral intake and had a somewhat restrictive calorie goal of 1500 to 1800 cal/day which was difficult to maintain He admits he has not been drinking enough liquids and have occasional snacking Gradually, after he stopped using [employer], he started to gain weight until his current 231 pounds He is interested to  lose weight again He is hoping to walk more.  He is able to walk intermittently 3 to 4 hours when he plays golf He has not been active since his wife has hip problem.  She is undergoing hip surgery tomorrow He denies abnormal night sweats, fever or chills -No dysuria, frequency or hematuria  SUMMARY OF ONCOLOGIC HISTORY: Oncology History  NHL (non-Hodgkin's lymphoma) ([place]) (Resolved)  12/31/2001 Initial Diagnosis   He was initially diagnosed with a follicular grade 1,  B-cell, non-Hodgkin's lymphoma in 2003 .  He has also been diagnosed with metachronous primary tumors of  his right kidney and ultimately underwent initial partial nephrectomy in March 2004 then a completion nephrectomy in January 2011. He was treated with 8 cycles of CHOP/Rituxan through July 2003. He was started on maintenance rituximab but only had 2 cycles due to development of delayed neutropenia.  There was suspicion for early progression on a CT scan done in July of 2009 which showed a single area of soft tissue density in the left periaortic region. This area remained stable on serial scans until a study done on 11/13/2011 which did show clear regrowth in this area enlarging from 2.6 x 0.9 cm to approximately 4.9 x 3.4 cm. A PET scan was done on 11/30/2011 which showed a single area of markedly abnormal activity with SUV up to 10.3 over the abnormal area of lymphadenopathy seen on CT scan. There were no other areas of activity.  He received 3000 cGy in 15 fractions to the left para-aortic lymph node mass between January 16 and 02/04/2012.    12/10/2017 Imaging   1. Stable CT of the abdomen and pelvis. No evidence for recurrent lymphoma. 2. Status post right nephrectomy. No evidence for local tumor recurrence or metastatic disease. 3. Status post repair of ventral abdominal wall hernia.     REVIEW OF SYSTEMS:   Constitutional: Denies fevers, chills or abnormal weight loss Eyes: Denies blurriness of vision Ears, nose, mouth, throat, and face: Denies mucositis or sore throat Respiratory: Denies cough, dyspnea or wheezes Cardiovascular: Denies palpitation, chest discomfort or lower extremity swelling Gastrointestinal:  Denies nausea, heartburn or change in bowel habits Skin: Denies abnormal skin rashes Lymphatics: Denies new lymphadenopathy or easy bruising Neurological:Denies numbness, tingling or new weaknesses Behavioral/Psych: Mood is stable, no new changes  All other systems were reviewed with the patient and are negative.  I have reviewed the past medical history, past surgical  history, social history and family history with the patient and they are unchanged from previous note.  ALLERGIES:  is allergic to losartan.  MEDICATIONS:  Current Outpatient Medications  Medication Sig Dispense Refill  . amLODipine (NORVASC) 5 MG tablet Take 1 tablet (5 mg total) by mouth daily. 90 tablet 3  . aspirin EC 81 MG tablet Take 81 mg by mouth daily.      [name] doxazosin (CARDURA) 2 MG tablet TAKE 1 TABLET BY MOUTH AT BEDTIME 90 tablet 1  . Fish Oil-Krill Oil CAPS Take 1 capsule by mouth daily.    . hydrochlorothiazide (HYDRODIURIL) 25 MG tablet Take 1 tablet (25 mg total) by mouth daily. 90 tablet 3  . lisinopril (PRINIVIL,ZESTRIL) 20 MG tablet Take 1 tablet (20 mg total) by mouth daily. 90 tablet 3  . Multiple Vitamin (MULTIVITAMIN) tablet Take 1 tablet by mouth daily.      . potassium chloride SA (KLOR-CON) 20 MEQ tablet Take 1 tablet (20 mEq total) by mouth daily. 90 tablet 2  . propranolol (INDERAL) 10  MG tablet Take 1 tablet (10 mg total) by mouth 4 (four) times daily as needed (palpitations or fast heart rate). 90 tablet 3  . tamsulosin (FLOMAX) 0.4 MG CAPS capsule Take 1 capsule by mouth 2 (two) times a week.     No current facility-administered medications for this visit.    PHYSICAL EXAMINATION: ECOG PERFORMANCE STATUS: 1 - Symptomatic but completely ambulatory  Vitals:   05/02/20 1246  BP: 132/80  Pulse: 60  Resp: 18  Temp: 98.5 F (36.9 C)  SpO2: 98%   Filed Weights   05/02/20 1246  Weight: 231 lb 6.4 oz (105 kg)    GENERAL:alert, no distress and comfortable SKIN: skin color, texture, turgor are normal, no rashes or significant lesions EYES: normal, Conjunctiva are pink and non-injected, sclera clear OROPHARYNX:no exudate, no erythema and lips, buccal mucosa, and tongue normal  NECK: supple, thyroid normal size, non-tender, without nodularity LYMPH:  no palpable lymphadenopathy in the cervical, axillary or inguinal LUNGS: clear to auscultation and  percussion with normal breathing effort HEART: regular rate & rhythm and no murmurs and no lower extremity edema ABDOMEN:abdomen soft, non-tender and normal bowel sounds Musculoskeletal:no cyanosis of digits and no clubbing  NEURO: alert & oriented x 3 with fluent speech, no focal motor/sensory deficits  LABORATORY DATA:  I have reviewed the data as listed    Component Value Date/Time   NA 144 05/02/2020 1333   NA 139 10/08/2019 1219   NA 141 12/05/2017 1013   K 3.6 05/02/2020 1333   K 3.9 12/05/2017 1013   CL 105 05/02/2020 1333   CL 105 03/02/2013 0818   CO2 27 05/02/2020 1333   CO2 27 12/05/2017 1013   GLUCOSE 112 (H) 05/02/2020 1333   GLUCOSE 84 12/05/2017 1013   GLUCOSE 88 03/02/2013 0818   BUN 17 05/02/2020 1333   BUN 18 10/08/2019 1219   BUN 16.9 12/05/2017 1013   CREATININE 1.01 05/02/2020 1333   CREATININE 1.1 12/05/2017 1013   CALCIUM 9.1 05/02/2020 1333   CALCIUM 9.3 12/05/2017 1013   PROT 6.3 (L) 05/02/2020 1333   PROT 7.0 12/05/2017 1013   ALBUMIN 4.0 05/02/2020 1333   ALBUMIN 4.4 12/05/2017 1013   AST 19 05/02/2020 1333   AST 20 12/05/2017 1013   ALT 17 05/02/2020 1333   ALT 18 12/05/2017 1013   ALKPHOS 60 05/02/2020 1333   ALKPHOS 71 12/05/2017 1013   BILITOT 0.8 05/02/2020 1333   BILITOT 0.75 12/05/2017 1013   GFRNONAA >60 05/02/2020 1333   GFRAA >60 05/02/2020 1333    No results found for: SPEP, UPEP  Lab Results  Component Value Date   WBC 4.8 05/02/2020   NEUTROABS 3.4 05/02/2020   HGB 15.4 05/02/2020   HCT 46.6 05/02/2020   MCV 90.7 05/02/2020   PLT 172 05/02/2020      Chemistry      Component Value Date/Time   NA 144 05/02/2020 1333   NA 139 10/08/2019 1219   NA 141 12/05/2017 1013   K 3.6 05/02/2020 1333   K 3.9 12/05/2017 1013   CL 105 05/02/2020 1333   CL 105 03/02/2013 0818   CO2 27 05/02/2020 1333   CO2 27 12/05/2017 1013   BUN 17 05/02/2020 1333   BUN 18 10/08/2019 1219   BUN 16.9 12/05/2017 1013   CREATININE 1.01  05/02/2020 1333   CREATININE 1.1 12/05/2017 1013      Component Value Date/Time   CALCIUM 9.1 05/02/2020 1333   CALCIUM 9.3 12/05/2017  1013   ALKPHOS 60 05/02/2020 1333   ALKPHOS 71 12/05/2017 1013   AST 19 05/02/2020 1333   AST 20 12/05/2017 1013   ALT 17 05/02/2020 1333   ALT 18 12/05/2017 1013   BILITOT 0.8 05/02/2020 1333   BILITOT 0.75 12/05/2017 1013

## 2020-05-02 NOTE — Assessment & Plan Note (Signed)
We had extensive discussions about weight loss strategies and dietary modification

## 2020-05-02 NOTE — Assessment & Plan Note (Signed)
His renal due to his nonspecific complaint, I recommend we proceed with CT imaging

## 2020-05-03 ENCOUNTER — Telehealth: Payer: Self-pay | Admitting: Hematology and Oncology

## 2020-05-03 NOTE — Telephone Encounter (Signed)
Scheduled appt per 5/3 sch msg. Pt requested time of appt because he has another appt that day. Pt confirmed appt date and time.

## 2020-05-10 ENCOUNTER — Ambulatory Visit (HOSPITAL_COMMUNITY)
Admission: RE | Admit: 2020-05-10 | Discharge: 2020-05-10 | Disposition: A | Discharge status: Home / Self Care | Payer: Medicare HMO | Source: Ambulatory Visit | Attending: Hematology and Oncology | Admitting: Hematology and Oncology

## 2020-05-10 ENCOUNTER — Other Ambulatory Visit: Payer: Self-pay

## 2020-05-10 DIAGNOSIS — Z85528 Personal history of other malignant neoplasm of kidney: Secondary | ICD-10-CM | POA: Insufficient documentation

## 2020-05-10 DIAGNOSIS — C8208 Follicular lymphoma grade I, lymph nodes of multiple sites: Secondary | ICD-10-CM | POA: Insufficient documentation

## 2020-05-10 DIAGNOSIS — Z85828 Personal history of other malignant neoplasm of skin: Secondary | ICD-10-CM | POA: Diagnosis not present

## 2020-05-10 DIAGNOSIS — Z8572 Personal history of non-Hodgkin lymphomas: Secondary | ICD-10-CM | POA: Insufficient documentation

## 2020-05-10 DIAGNOSIS — C8330 Diffuse large B-cell lymphoma, unspecified site: Secondary | ICD-10-CM | POA: Diagnosis not present

## 2020-05-12 ENCOUNTER — Inpatient Hospital Stay: Payer: Medicare HMO | Admitting: Hematology and Oncology

## 2020-05-12 ENCOUNTER — Encounter: Payer: Self-pay | Admitting: Hematology and Oncology

## 2020-05-12 ENCOUNTER — Other Ambulatory Visit: Payer: Self-pay

## 2020-05-12 ENCOUNTER — Ambulatory Visit: Payer: Medicare HMO | Admitting: Hematology and Oncology

## 2020-05-12 DIAGNOSIS — E669 Obesity, unspecified: Secondary | ICD-10-CM

## 2020-05-12 DIAGNOSIS — Z79899 Other long term (current) drug therapy: Secondary | ICD-10-CM | POA: Diagnosis not present

## 2020-05-12 DIAGNOSIS — D485 Neoplasm of uncertain behavior of skin: Secondary | ICD-10-CM | POA: Diagnosis not present

## 2020-05-12 DIAGNOSIS — Z85528 Personal history of other malignant neoplasm of kidney: Secondary | ICD-10-CM

## 2020-05-12 DIAGNOSIS — R1011 Right upper quadrant pain: Secondary | ICD-10-CM | POA: Diagnosis not present

## 2020-05-12 DIAGNOSIS — Z8572 Personal history of non-Hodgkin lymphomas: Secondary | ICD-10-CM | POA: Diagnosis not present

## 2020-05-12 DIAGNOSIS — L905 Scar conditions and fibrosis of skin: Secondary | ICD-10-CM | POA: Diagnosis not present

## 2020-05-13 ENCOUNTER — Telehealth: Payer: Self-pay | Admitting: Hematology and Oncology

## 2020-05-13 ENCOUNTER — Encounter: Payer: Self-pay | Admitting: Hematology and Oncology

## 2020-05-13 NOTE — Progress Notes (Signed)
[facility] OFFICE PROGRESS NOTE  Patient Care Team: Hulan Fess, MD as PCP - General (Family Medicine) Arloa Koh, MD (Inactive) as Consulting Physician (Radiation Oncology) Kathie Rhodes, MD as Consulting Physician (Urology) Heath Lark, MD as Consulting Physician (Hematology and Oncology)  ASSESSMENT & PLAN:  History of B-cell lymphoma I have reviewed the imaging with him He has no signed of cancer recurrence I will not order routine surveillance imaging unless he has signs or symptoms of cancer recurrence I will see him in 1 year  History of kidney cancer Recent CT showed no evidence of recurrence He is reassured  Class 1 obesity We had extensive discussions about weight loss strategies and dietary modification He wants to set target goal weight os 215 pounds by next visit   No orders of the defined types were placed in this encounter.   All questions were answered. The patient knows to call the clinic with any problems, questions or concerns. The total time spent in the appointment was 15 minutes encounter with patients including review of chart and various tests results, discussions about plan of care and coordination of care plan   Heath Lark, MD 05/13/2020 7:15 AM  INTERVAL HISTORY: Please see below for problem oriented charting. He returns to review CT scan He is doing well He is motivated to lose weight. His wife is doing well after her hip surgery  SUMMARY OF ONCOLOGIC HISTORY: Oncology History  NHL (non-Hodgkin's lymphoma) ([place])  12/31/2001 Initial Diagnosis   He was initially diagnosed with a follicular grade 1,  B-cell, non-Hodgkin's lymphoma in 2003 .  He has also been diagnosed with metachronous primary tumors of his right kidney and ultimately underwent initial partial nephrectomy in March 2004 then a completion nephrectomy in January 2011. He was treated with 8 cycles of CHOP/Rituxan through July 2003. He was started on maintenance  rituximab but only had 2 cycles due to development of delayed neutropenia.  There was suspicion for early progression on a CT scan done in July of 2009 which showed a single area of soft tissue density in the left periaortic region. This area remained stable on serial scans until a study done on 11/13/2011 which did show clear regrowth in this area enlarging from 2.6 x 0.9 cm to approximately 4.9 x 3.4 cm. A PET scan was done on 11/30/2011 which showed a single area of markedly abnormal activity with SUV up to 10.3 over the abnormal area of lymphadenopathy seen on CT scan. There were no other areas of activity.  He received 3000 cGy in 15 fractions to the left para-aortic lymph node mass between January 16 and 02/04/2012.    12/10/2017 Imaging   1. Stable CT of the abdomen and pelvis. No evidence for recurrent lymphoma. 2. Status post right nephrectomy. No evidence for local tumor recurrence or metastatic disease. 3. Status post repair of ventral abdominal wall hernia.   05/11/2020 Imaging   1. No findings of recurrent lymphoma. 2. Other imaging findings of potential clinical significance: Sigmoid colon diverticulosis. Stable prostatomegaly. 3. Aortic atherosclerosis.   Aortic Atherosclerosis (ICD10-I70.0).       REVIEW OF SYSTEMS:   Constitutional: Denies fevers, chills or abnormal weight loss Eyes: Denies blurriness of vision Ears, nose, mouth, throat, and face: Denies mucositis or sore throat Respiratory: Denies cough, dyspnea or wheezes Cardiovascular: Denies palpitation, chest discomfort or lower extremity swelling Gastrointestinal:  Denies nausea, heartburn or change in bowel habits Skin: Denies abnormal skin rashes Lymphatics: Denies new lymphadenopathy or  easy bruising Neurological:Denies numbness, tingling or new weaknesses Behavioral/Psych: Mood is stable, no new changes  All other systems were reviewed with the patient and are negative.  I have reviewed the past medical  history, past surgical history, social history and family history with the patient and they are unchanged from previous note.  ALLERGIES:  is allergic to losartan.  MEDICATIONS:  Current Outpatient Medications  Medication Sig Dispense Refill  . amLODipine (NORVASC) 5 MG tablet Take 1 tablet (5 mg total) by mouth daily. 90 tablet 3  . aspirin EC 81 MG tablet Take 81 mg by mouth daily.      [name] doxazosin (CARDURA) 2 MG tablet TAKE 1 TABLET BY MOUTH AT BEDTIME 90 tablet 1  . Fish Oil-Krill Oil CAPS Take 1 capsule by mouth daily.    . hydrochlorothiazide (HYDRODIURIL) 25 MG tablet Take 1 tablet (25 mg total) by mouth daily. 90 tablet 3  . lisinopril (PRINIVIL,ZESTRIL) 20 MG tablet Take 1 tablet (20 mg total) by mouth daily. 90 tablet 3  . Multiple Vitamin (MULTIVITAMIN) tablet Take 1 tablet by mouth daily.      . potassium chloride SA (KLOR-CON) 20 MEQ tablet Take 1 tablet (20 mEq total) by mouth daily. 90 tablet 2  . propranolol (INDERAL) 10 MG tablet Take 1 tablet (10 mg total) by mouth 4 (four) times daily as needed (palpitations or fast heart rate). 90 tablet 3  . tamsulosin (FLOMAX) 0.4 MG CAPS capsule Take 1 capsule by mouth 2 (two) times a week.     No current facility-administered medications for this visit.    PHYSICAL EXAMINATION: ECOG PERFORMANCE STATUS: 0 - Asymptomatic  Vitals:   05/12/20 1208  BP: 136/88  Pulse: 72  Resp: 18  Temp: 98 F (36.7 C)  SpO2: 99%   Filed Weights   05/12/20 1208  Weight: 232 lb 6.4 oz (105.4 kg)    GENERAL:alert, no distress and comfortable NEURO: alert & oriented x 3 with fluent speech, no focal motor/sensory deficits  LABORATORY DATA:  I have reviewed the data as listed    Component Value Date/Time   NA 144 05/02/2020 1333   NA 139 10/08/2019 1219   NA 141 12/05/2017 1013   K 3.6 05/02/2020 1333   K 3.9 12/05/2017 1013   CL 105 05/02/2020 1333   CL 105 03/02/2013 0818   CO2 27 05/02/2020 1333   CO2 27 12/05/2017 1013   GLUCOSE  112 (H) 05/02/2020 1333   GLUCOSE 84 12/05/2017 1013   GLUCOSE 88 03/02/2013 0818   BUN 17 05/02/2020 1333   BUN 18 10/08/2019 1219   BUN 16.9 12/05/2017 1013   CREATININE 1.01 05/02/2020 1333   CREATININE 1.1 12/05/2017 1013   CALCIUM 9.1 05/02/2020 1333   CALCIUM 9.3 12/05/2017 1013   PROT 6.3 (L) 05/02/2020 1333   PROT 7.0 12/05/2017 1013   ALBUMIN 4.0 05/02/2020 1333   ALBUMIN 4.4 12/05/2017 1013   AST 19 05/02/2020 1333   AST 20 12/05/2017 1013   ALT 17 05/02/2020 1333   ALT 18 12/05/2017 1013   ALKPHOS 60 05/02/2020 1333   ALKPHOS 71 12/05/2017 1013   BILITOT 0.8 05/02/2020 1333   BILITOT 0.75 12/05/2017 1013   GFRNONAA >60 05/02/2020 1333   GFRAA >60 05/02/2020 1333    No results found for: SPEP, UPEP  Lab Results  Component Value Date   WBC 4.8 05/02/2020   NEUTROABS 3.4 05/02/2020   HGB 15.4 05/02/2020   HCT 46.6 05/02/2020  MCV 90.7 05/02/2020   PLT 172 05/02/2020      Chemistry      Component Value Date/Time   NA 144 05/02/2020 1333   NA 139 10/08/2019 1219   NA 141 12/05/2017 1013   K 3.6 05/02/2020 1333   K 3.9 12/05/2017 1013   CL 105 05/02/2020 1333   CL 105 03/02/2013 0818   CO2 27 05/02/2020 1333   CO2 27 12/05/2017 1013   BUN 17 05/02/2020 1333   BUN 18 10/08/2019 1219   BUN 16.9 12/05/2017 1013   CREATININE 1.01 05/02/2020 1333   CREATININE 1.1 12/05/2017 1013      Component Value Date/Time   CALCIUM 9.1 05/02/2020 1333   CALCIUM 9.3 12/05/2017 1013   ALKPHOS 60 05/02/2020 1333   ALKPHOS 71 12/05/2017 1013   AST 19 05/02/2020 1333   AST 20 12/05/2017 1013   ALT 17 05/02/2020 1333   ALT 18 12/05/2017 1013   BILITOT 0.8 05/02/2020 1333   BILITOT 0.75 12/05/2017 1013       RADIOGRAPHIC STUDIES:I reviewed Ct with patient I have personally reviewed the radiological images as listed and agreed with the findings in the report. CT Abdomen Pelvis Wo Contrast  Result Date: 05/11/2020 CLINICAL DATA:  B-cell lymphoma EXAM: CT ABDOMEN  AND PELVIS WITHOUT CONTRAST TECHNIQUE: Multidetector CT imaging of the abdomen and pelvis was performed following the standard protocol without IV contrast. COMPARISON:  12/10/2017 FINDINGS: Lower chest: Mild scarring in both lower lobes. Hepatobiliary: Unremarkable Pancreas: Unremarkable Spleen: Unremarkable Adrenals/Urinary Tract: Small focus of fat density along the lateral limb of the left adrenal gland measuring 0.6 cm in diameter, possibly a tiny myelolipoma. Right adrenal gland normal. Left nephrectomy. Exophytic 0.9 by 0.6 cm hypodense lesion of the left mid kidney anterolaterally on image 35/2, technically nonspecific on today's noncontrast CT, but roughly similar to prior. Prominent prostate gland indents the bladder base. No renal calculi are identified. Stomach/Bowel: Sigmoid colon diverticulosis. No findings of active diverticulitis. Vascular/Lymphatic: Aortoiliac atherosclerotic vascular disease. Reproductive: Stable prostatomegaly, indenting the urinary bladder. Other: No supplemental non-categorized findings. Musculoskeletal: Ventral hernia mesh. IMPRESSION: 1. No findings of recurrent lymphoma. 2. Other imaging findings of potential clinical significance: Sigmoid colon diverticulosis. Stable prostatomegaly. 3. Aortic atherosclerosis. Aortic Atherosclerosis (ICD10-I70.0). Electronically Signed   By: Van Clines M.D.   On: 05/11/2020 13:24

## 2020-05-13 NOTE — Assessment & Plan Note (Signed)
I have reviewed the imaging with him He has no signed of cancer recurrence I will not order routine surveillance imaging unless he has signs or symptoms of cancer recurrence I will see him in 1 year

## 2020-05-13 NOTE — Telephone Encounter (Signed)
Scheduled apt per 5/14 sch message - mailed letter with appt date and time

## 2020-05-13 NOTE — Assessment & Plan Note (Signed)
Recent CT showed no evidence of recurrence He is reassured

## 2020-05-13 NOTE — Assessment & Plan Note (Signed)
We had extensive discussions about weight loss strategies and dietary modification He wants to set target goal weight os 215 pounds by next visit

## 2020-05-16 ENCOUNTER — Other Ambulatory Visit: Payer: Self-pay

## 2020-05-16 MED ORDER — LISINOPRIL 20 MG PO TABS: 20.0000 mg | ORAL_TABLET | Freq: Every day | ORAL | 3 refills | Status: DC

## 2020-05-27 DIAGNOSIS — M9902 Segmental and somatic dysfunction of thoracic region: Secondary | ICD-10-CM | POA: Diagnosis not present

## 2020-05-27 DIAGNOSIS — M9903 Segmental and somatic dysfunction of lumbar region: Secondary | ICD-10-CM | POA: Diagnosis not present

## 2020-05-27 DIAGNOSIS — M545 Low back pain: Secondary | ICD-10-CM | POA: Diagnosis not present

## 2020-05-27 DIAGNOSIS — M546 Pain in thoracic spine: Secondary | ICD-10-CM | POA: Diagnosis not present

## 2020-05-27 DIAGNOSIS — M791 Myalgia, unspecified site: Secondary | ICD-10-CM | POA: Diagnosis not present

## 2020-09-01 DIAGNOSIS — R3912 Poor urinary stream: Secondary | ICD-10-CM | POA: Diagnosis not present

## 2020-09-01 DIAGNOSIS — R972 Elevated prostate specific antigen [PSA]: Secondary | ICD-10-CM | POA: Diagnosis not present

## 2020-09-01 DIAGNOSIS — C641 Malignant neoplasm of right kidney, except renal pelvis: Secondary | ICD-10-CM | POA: Diagnosis not present

## 2020-09-01 DIAGNOSIS — N401 Enlarged prostate with lower urinary tract symptoms: Secondary | ICD-10-CM | POA: Diagnosis not present

## 2020-09-12 DIAGNOSIS — L03031 Cellulitis of right toe: Secondary | ICD-10-CM | POA: Diagnosis not present

## 2020-09-12 DIAGNOSIS — L600 Ingrowing nail: Secondary | ICD-10-CM | POA: Diagnosis not present

## 2020-09-15 DIAGNOSIS — Z6833 Body mass index (BMI) 33.0-33.9, adult: Secondary | ICD-10-CM | POA: Diagnosis not present

## 2020-09-15 DIAGNOSIS — L03031 Cellulitis of right toe: Secondary | ICD-10-CM | POA: Diagnosis not present

## 2020-09-15 DIAGNOSIS — Z5189 Encounter for other specified aftercare: Secondary | ICD-10-CM | POA: Diagnosis not present

## 2020-09-15 DIAGNOSIS — L600 Ingrowing nail: Secondary | ICD-10-CM | POA: Diagnosis not present

## 2020-09-28 ENCOUNTER — Ambulatory Visit: Payer: Medicare HMO | Admitting: Orthopedic Surgery

## 2020-09-28 ENCOUNTER — Encounter: Payer: Self-pay | Admitting: Orthopedic Surgery

## 2020-09-28 DIAGNOSIS — M1711 Unilateral primary osteoarthritis, right knee: Secondary | ICD-10-CM

## 2020-09-28 MED ORDER — LIDOCAINE HCL 1 % IJ SOLN
5.0000 mL | INTRAMUSCULAR | Status: AC | PRN
Start: 2020-09-28 — End: 2020-09-28
  Administered 2020-09-28: 5 mL

## 2020-09-28 MED ORDER — METHYLPREDNISOLONE ACETATE 40 MG/ML IJ SUSP
40.0000 mg | INTRAMUSCULAR | Status: AC | PRN
  Administered 2020-09-28: 40 mg via INTRA_ARTICULAR

## 2020-09-28 MED ORDER — BUPIVACAINE HCL 0.25 % IJ SOLN
4.0000 mL | INTRAMUSCULAR | Status: AC | PRN
  Administered 2020-09-28: 4 mL via INTRA_ARTICULAR

## 2020-09-28 NOTE — Progress Notes (Signed)
Office Visit Note   Patient: Brian Parker           Date of Birth: 1950-08-22           MRN: [id number] Visit Date: 09/28/2020 Requested by: Brian Fess, MD Brian [place],  [place] [id number] PCP: Brian Fess, MD  Subjective: Chief Complaint  Patient presents with  . Right Knee - Pain    HPI: Brian Parker is a 70 year old patient with right knee pain.  Reports some episodic sticking "locking".  Has known history of arthritis in the right knee.  Has done well with left total knee replacement.  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  Is just when that right knee gets in a certain position that he has to "unlock it".  No meds for pain.  Has had prior cortisone injection with some relief.  Describes that feeling in his right knee is a rubber band popping.  Stairs are okay.  Prior injection has helped.              ROS: All systems reviewed are negative as they relate to the chief complaint within the history of present illness.  Patient denies  fevers or chills.   Assessment & Plan: Visit Diagnoses:  1. Arthritis of right knee     Plan: Impression is right knee episodic rubber band type catching without definitive indication for any type of arthroscopic treatment or knee replacement.  He does have end-stage knee arthritis in that right knee.  Aspiration injection performed today.  Continue with quad strengthening exercises and weight loss which he is trying to do.  Follow-up with me as needed.  Follow-Up Instructions: Return if symptoms worsen or fail to improve.   Orders:  No orders of the defined types were placed in this encounter.  No orders of the defined types were placed in this encounter.     Procedures: Large Joint Inj: R knee on 09/28/2020 1:22 PM Indications: diagnostic evaluation, joint swelling and pain Details: 18 G 1.5 in needle, superolateral approach  Arthrogram: No  Medications: 5 mL lidocaine 1 %; 40 mg methylPREDNISolone acetate 40 MG/ML; 4 mL  bupivacaine 0.25 % Outcome: tolerated well, no immediate complications Procedure, treatment alternatives, risks and benefits explained, specific risks discussed. Consent was given by the patient. Immediately prior to procedure a time out was called to verify the correct patient, procedure, equipment, support staff and site/side marked as required. Patient was prepped and draped in the usual sterile fashion.       Clinical Data: No additional findings.  Objective: Vital Signs: There were no vitals taken for this visit.  Physical Exam:   Constitutional: Patient appears well-developed HEENT:  Head: Normocephalic Eyes:EOM are normal Neck: Normal range of motion Cardiovascular: Normal rate Pulmonary/chest: Effort normal Neurologic: Patient is alert Skin: Skin is warm Psychiatric: Patient has normal mood and affect    Ortho Exam: Ortho exam demonstrates full active and passive range of motion of the right knee.  Trace effusion is present.  Collateral crucial ligaments are stable.  He does have some crepitus with range of motion as well as some medial joint line tenderness but his collaterals are stable.  Extensor mechanism is intact.  No other masses lymphadenopathy or skin changes noted in that right knee region.  Specialty Comments:  No specialty comments available.  Imaging: No results found.   PMFS History: Patient Active Problem List   Diagnosis Date Noted  . Class 1 obesity  12/08/2019  . PVC (premature ventricular contraction) 02/26/2017  . History of B-cell lymphoma 12/05/2015  . History of skin cancer 12/05/2015  . NHL (non-Hodgkin's lymphoma) (Brian Parker) 12/06/2014  . Preventive measure 12/06/2014  . Elevated prostate specific antigen (PSA) 11/20/2011  . Essential hypertension 11/09/2011  . History of kidney cancer 03/01/2003   Past Medical History:  Diagnosis Date  . Cancer of kidney, secondary (Brian Parker) 01/06/2010   right kidney//right kidney removed//left kidney  functions WDL  . Chest pain    INTERMITTENT/15 years ago//stress induced  . Edema of lower extremity   . Elevated prostate specific antigen (PSA) 11/20/2011  . Follicular lymphoma grade I of intra-abdominal lymph nodes (Brian Parker) 12/31/2006  . History of B-cell lymphoma 12/05/2015  . Hypertension   . Kidney carcinoma (Brian Parker) 03/01/2003   tumor seperate from lymphoma; found during routine screening for lymphoma  . NHL (non-Hodgkin's lymphoma) (Brian Parker) 12/06/2014  . Nodular lymphoma involving intra-abdominal lymph nodes (Brian Parker) 12/20/2011  . Non Hodgkin's lymphoma (Brian Parker)     Family History  Problem Relation Age of Onset  . Uterine cancer Mother   . Cervical cancer Mother   . Coronary artery disease Father   . Hypertension Father     Past Surgical History:  Procedure Laterality Date  . APPENDECTOMY    . CARDIOVASCULAR STRESS TEST  10/19/2008   EF 52%, NO ISCHEMIA  . GROIN MASS OPEN BIOPSY    . HERNIA REPAIR  2006-2007   right laparoscopic  . HERNIA REPAIR  2006-2007   left laparoscopic  . NEPHRECTOMY     right kidney  . SHOULDER SURGERY    . TOTAL KNEE ARTHROPLASTY Left 03/25/2013   Dr Marlou Sa  . TOTAL KNEE ARTHROPLASTY Left 03/24/2013   Procedure: LEFT TOTAL KNEE ARTHROPLASTY;  Surgeon: Meredith Pel, MD;  Location: [facility];  Service: Orthopedics;  Laterality: Left;  Left Total Knee Arthroplasty  . US ECHOCARDIOGRAPHY  02/26/2006   EF 55-60%  . VENTRAL HERNIA REPAIR  2003-2004   with mesh after biopsy   Social History   Occupational History  . Not on file  Tobacco Use  . Smoking status: Never Smoker  . Smokeless tobacco: Never Used  Vaping Use  . Vaping Use: Never used  Substance and Sexual Activity  . Alcohol use: Yes    Alcohol/week: 7.0 standard drinks    Types: 6 Glasses of wine, 1 Cans of beer per week    Comment: social  . Drug use: No  . Sexual activity: Not on file

## 2020-10-14 ENCOUNTER — Ambulatory Visit: Payer: Medicare HMO | Admitting: Cardiovascular Disease

## 2020-11-03 ENCOUNTER — Ambulatory Visit: Payer: Medicare HMO | Admitting: Cardiovascular Disease

## 2020-11-04 ENCOUNTER — Other Ambulatory Visit: Payer: Self-pay

## 2020-11-04 ENCOUNTER — Inpatient Hospital Stay: Payer: Medicare HMO | Attending: Hematology and Oncology | Admitting: Hematology and Oncology

## 2020-11-04 ENCOUNTER — Telehealth: Payer: Self-pay

## 2020-11-04 ENCOUNTER — Encounter: Payer: Self-pay | Admitting: Hematology and Oncology

## 2020-11-04 DIAGNOSIS — R600 Localized edema: Secondary | ICD-10-CM | POA: Diagnosis not present

## 2020-11-04 DIAGNOSIS — L989 Disorder of the skin and subcutaneous tissue, unspecified: Secondary | ICD-10-CM | POA: Insufficient documentation

## 2020-11-04 DIAGNOSIS — I872 Venous insufficiency (chronic) (peripheral): Secondary | ICD-10-CM | POA: Diagnosis not present

## 2020-11-04 NOTE — Telephone Encounter (Signed)
Called and scheduled appt at 1220 today. He is aware of app time.

## 2020-11-04 NOTE — Assessment & Plan Note (Signed)
He has multiple subcutaneous nodule on the right lower extremity and one on the left lower extremity They appears benign The patient is reassured He doesn't need further work-up, imaging studies or biopsy I told the patient this is not a typical presentation for recurrent lymphoma Observe closely for now

## 2020-11-04 NOTE — Telephone Encounter (Signed)
-----   Message from Heath Lark, MD sent at 11/04/2020 11:18 AM EDT ----- Regarding: pls call him

## 2020-11-04 NOTE — Assessment & Plan Note (Signed)
He has bilateral lower extremity edema He has appointment to follow-up with cardiologist for medical management

## 2020-11-04 NOTE — Progress Notes (Signed)
[facility] OFF[facility] PROGRESS NOTE  Patient Care Team: Hulan Fess, MD as PCP - General (Family Medicine) Arloa Koh, MD (Inactive) as Consulting Physician (Radiation Oncology) Kathie Rhodes, MD (Inactive) as Consulting Physician (Urology) Heath Lark, MD as Consulting Physician (Hematology and Oncology)  ASSESSMENT & PLAN:  Lesion of subcutaneous tissue He has multiple subcutaneous nodule on the right lower extremity and one on the left lower extremity They appears benign The patient is reassured He doesn't need further work-up, imaging studies or biopsy I told the patient this is not a typical presentation for recurrent lymphoma Observe closely for now  Edema of lower extremity He has bilateral lower extremity edema He has appointment to follow-up with cardiologist for medical management  Chronic venous stasis dermatitis of both lower extremities He is noted to have chronic bilateral venous stasis changes on both legs I suspect the subcutaneous nodule that he palpated could be a bruise underneath We discussed risk factor modification, exercise and medical management to reduce bilateral lower extremity edema   No orders of the defined types were placed in this encounter.   All questions were answered. The patient knows to call the clinic with any problems, questions or concerns. The total time spent in the appointment was 20 minutes encounter with patients including review of chart and various tests results, discussions about plan of care and coordination of care plan   Heath Lark, MD 11/04/2020 12:59 PM  INTERVAL HISTORY: Please see below for problem oriented charting. He is seen urgently for further evaluation The patient have significant concern He thought he might have accidentally traumatized the right lower extremity He palpated a small lump on the lateral aspect of his right lower extremity, approximately 6 to 7 inches below the knee It is somewhat  mildly tender on deep palpation He is concerned that the lumps could represent recurrent lymphoma He admits he have chronic bilateral lower extremity edema for some time He is attempting to walk on a regular basis and lose some weight since her last visit  SUMMARY OF ONCOLOGIC HISTORY: Oncology History  NHL (non-Hodgkin's lymphoma) ([place])  12/31/2001 Initial Diagnosis   He was initially diagnosed with a follicular grade 1,  B-cell, non-Hodgkin's lymphoma in 2003 .  He has also been diagnosed with metachronous primary tumors of his right kidney and ultimately underwent initial partial nephrectomy in March 2004 then a completion nephrectomy in January 2011. He was treated with 8 cycles of CHOP/Rituxan through July 2003. He was started on maintenance rituximab but only had 2 cycles due to development of delayed neutropenia.  There was suspicion for early progression on a CT scan done in July of 2009 which showed a single area of soft tissue density in the left periaortic region. This area remained stable on serial scans until a study done on 11/13/2011 which did show clear regrowth in this area enlarging from 2.6 x 0.9 cm to approximately 4.9 x 3.4 cm. A PET scan was done on 11/30/2011 which showed a single area of markedly abnormal activity with SUV up to 10.3 over the abnormal area of lymphadenopathy seen on CT scan. There were no other areas of activity.  He received 3000 cGy in 15 fractions to the left para-aortic lymph node mass between January 16 and 02/04/2012.    12/10/2017 Imaging   1. Stable CT of the abdomen and pelvis. No evidence for recurrent lymphoma. 2. Status post right nephrectomy. No evidence for local tumor recurrence or metastatic disease. 3. Status post  repair of ventral abdominal wall hernia.   05/11/2020 Imaging   1. No findings of recurrent lymphoma. 2. Other imaging findings of potential clinical significance: Sigmoid colon diverticulosis. Stable prostatomegaly. 3. Aortic  atherosclerosis.   Aortic Atherosclerosis (ICD10-I70.0).       REVIEW OF SYSTEMS:   Constitutional: Denies fevers, chills or abnormal weight loss Eyes: Denies blurriness of vision Ears, nose, mouth, throat, and face: Denies mucositis or sore throat Respiratory: Denies cough, dyspnea or wheezes Cardiovascular: Denies palpitation, chest discomfort  Gastrointestinal:  Denies nausea, heartburn or change in bowel habits Lymphatics: Denies new lymphadenopathy or easy bruising Neurological:Denies numbness, tingling or new weaknesses Behavioral/Psych: Mood is stable, no new changes  All other systems were reviewed with the patient and are negative.  I have reviewed the past medical history, past surgical history, social history and family history with the patient and they are unchanged from previous note.  ALLERGIES:  is allergic to losartan.  MEDICATIONS:  Current Outpatient Medications  Medication Sig Dispense Refill  . amLODipine (NORVASC) 5 MG tablet Take 1 tablet (5 mg total) by mouth daily. 90 tablet 3  . aspirin EC 81 MG tablet Take 81 mg by mouth daily.      [name] doxazosin (CARDURA) 2 MG tablet TAKE 1 TABLET BY MOUTH AT BEDTIME 90 tablet 1  . Fish Oil-Krill Oil CAPS Take 1 capsule by mouth daily.    . hydrochlorothiazide (HYDRODIURIL) 25 MG tablet Take 1 tablet (25 mg total) by mouth daily. 90 tablet 3  . lisinopril (ZESTRIL) 20 MG tablet Take 1 tablet (20 mg total) by mouth daily. 90 tablet 3  . Multiple Vitamin (MULTIVITAMIN) tablet Take 1 tablet by mouth daily.      . potassium chloride SA (KLOR-CON) 20 MEQ tablet Take 1 tablet (20 mEq total) by mouth daily. 90 tablet 2  . propranolol (INDERAL) 10 MG tablet Take 1 tablet (10 mg total) by mouth 4 (four) times daily as needed (palpitations or fast heart rate). 90 tablet 3  . tamsulosin (FLOMAX) 0.4 MG CAPS capsule Take 1 capsule by mouth 2 (two) times a week.     No current facility-administered medications for this visit.     PHYSICAL EXAMINATION: ECOG PERFORMANCE STATUS: 1 - Symptomatic but completely ambulatory  Vitals:   11/04/20 1236  BP: (!) 145/84  Pulse: 88  Resp: 18  Temp: (!) 97 F (36.1 C)  SpO2: 99%   Filed Weights   11/04/20 1236  Weight: 229 lb (103.9 kg)    GENERAL:alert, no distress and comfortable SKIN: No palpable subcutaneous skin lesions are benign in etiology, likely represent minor bruising HEART: He has mild to moderate bilateral lower extremity edema with chronic venous stasis changes NEURO: alert & oriented x 3 with fluent speech, no focal motor/sensory deficits  LABORATORY DATA:  I have reviewed the data as listed    Component Value Date/Time   NA 144 05/02/2020 1333   NA 139 10/08/2019 1219   NA 141 12/05/2017 1013   K 3.6 05/02/2020 1333   K 3.9 12/05/2017 1013   CL 105 05/02/2020 1333   CL 105 03/02/2013 0818   CO2 27 05/02/2020 1333   CO2 27 12/05/2017 1013   GLUCOSE 112 (H) 05/02/2020 1333   GLUCOSE 84 12/05/2017 1013   GLUCOSE 88 03/02/2013 0818   BUN 17 05/02/2020 1333   BUN 18 10/08/2019 1219   BUN 16.9 12/05/2017 1013   CREATININE 1.01 05/02/2020 1333   CREATININE 1.1 12/05/2017 1013  CALCIUM 9.1 05/02/2020 1333   CALCIUM 9.3 12/05/2017 1013   PROT 6.3 (L) 05/02/2020 1333   PROT 7.0 12/05/2017 1013   ALBUMIN 4.0 05/02/2020 1333   ALBUMIN 4.4 12/05/2017 1013   AST 19 05/02/2020 1333   AST 20 12/05/2017 1013   ALT 17 05/02/2020 1333   ALT 18 12/05/2017 1013   ALKPHOS 60 05/02/2020 1333   ALKPHOS 71 12/05/2017 1013   BILITOT 0.8 05/02/2020 1333   BILITOT 0.75 12/05/2017 1013   GFRNONAA >60 05/02/2020 1333   GFRAA >60 05/02/2020 1333    No results found for: SPEP, UPEP  Lab Results  Component Value Date   WBC 4.8 05/02/2020   NEUTROABS 3.4 05/02/2020   HGB 15.4 05/02/2020   HCT 46.6 05/02/2020   MCV 90.7 05/02/2020   PLT 172 05/02/2020      Chemistry      Component Value Date/Time   NA 144 05/02/2020 1333   NA 139 10/08/2019  1219   NA 141 12/05/2017 1013   K 3.6 05/02/2020 1333   K 3.9 12/05/2017 1013   CL 105 05/02/2020 1333   CL 105 03/02/2013 0818   CO2 27 05/02/2020 1333   CO2 27 12/05/2017 1013   BUN 17 05/02/2020 1333   BUN 18 10/08/2019 1219   BUN 16.9 12/05/2017 1013   CREATININE 1.01 05/02/2020 1333   CREATININE 1.1 12/05/2017 1013      Component Value Date/Time   CALCIUM 9.1 05/02/2020 1333   CALCIUM 9.3 12/05/2017 1013   ALKPHOS 60 05/02/2020 1333   ALKPHOS 71 12/05/2017 1013   AST 19 05/02/2020 1333   AST 20 12/05/2017 1013   ALT 17 05/02/2020 1333   ALT 18 12/05/2017 1013   BILITOT 0.8 05/02/2020 1333   BILITOT 0.75 12/05/2017 1013

## 2020-11-04 NOTE — Assessment & Plan Note (Signed)
He is noted to have chronic bilateral venous stasis changes on both legs I suspect the subcutaneous nodule that he palpated could be a bruise underneath We discussed risk factor modification, exercise and medical management to reduce bilateral lower extremity edema

## 2020-11-09 DIAGNOSIS — L720 Epidermal cyst: Secondary | ICD-10-CM | POA: Diagnosis not present

## 2020-11-20 ENCOUNTER — Encounter: Payer: Self-pay | Admitting: Cardiovascular Disease

## 2020-11-20 NOTE — Progress Notes (Signed)
Brian Parker Date of Birth  12-14-1950 Brian Parker        [address]    [place]     [place], [place]  [address]      [phone]  Fax  [phone]     1. Hypertension 2. Non Hodgkins Lymphoma 3.  PVCs    Tramel has done well from a cardiac standpoint.  He recently had XRT for a growing lymph node in his abdomen.   His BP has been well controlled at home.    January 29, 2013: He continues to have issues with his non-hodgkins lymphoma.  He had XTR and has been declared in remission .  He had a  Left leg puncture would from a drill bit - never really healed well.  He recently had this would debrided and is scheduled to have a left knee replacement once this has healed.  He has not been able to exercise because of these leg issues.   Nov. 13, 2014: Brian Parker is doing well. His BP has been well controlled.  His lymphoma is still in remission.  Playing golf regularly.   May 19, 2014: Brian Parker is doing well.  Playing some golf.   BP has been well controlled.    He has been playing golf in the evenings ( walks 9 holes).   He is slowing losing wome weight.    June 27, 2015:   Brian Parker is doing well.  No CP .  Playing golf regularly .  Has been tracking what he eats .  Has been measuring his BP  several times a day   Sept. 1, 2017:  Doing well. Just got back from [place] playing golf.   Played with Brian Parker.  Is active.  Also went to [place] and [place].   Walking regularly .  Lymphoma appears to be under good control   Feb. 27, 2018 Doing well Has felt some palpitations Occurs several times ( 15-20 times this AM already )  Not related to activity Seems to be worse when he is sitting  Not associated with CP or dyspnea or pre-syncope.  Went to the ER , was found to have PVCs  Potassium was low. Sleeping well, more caffeine recently  Walks regularly  - walked 18 holes of golf on Sunday , no issues with walking   03/31/18  Feeling well. BP is a bit higher  today . Perhaps ate more salt than he should  No CP or dyspnea.  Has occasional Palpitations .  Has propranolol - does not take it  Snores,   Asked about OSA   September 22, 2018: Brian Parker is doing well.  BP is well controlled  has occasional PVCs  Playing golf regularly .   Walks most of the time   October 08, 2019: Brian Parker is seen today for follow-up visit.  He has a history of palpitations and has had PVCs diagnosed by EKG.  He has propanolol to take on an as-needed basis ( he has never taken)  He plays golf on a regular basis. No CP , no dyspnea   Nov. 22, 2021: Brian Parker is seen today for follow up of his palpitations /PVCs, HTN. Golfing is going well  No CP ,    Current Outpatient Medications on File Prior to Visit  Medication Sig Dispense Refill  . amLODipine (NORVASC) 5 MG tablet Take 1 tablet (5 mg total) by mouth daily. 90 tablet 3  . aspirin  EC 81 MG tablet Take 81 mg by mouth daily.      [name] doxazosin (CARDURA) 2 MG tablet TAKE 1 TABLET BY MOUTH AT BEDTIME 90 tablet 1  . Fish Oil-Krill Oil CAPS Take 1 capsule by mouth daily.    . hydrochlorothiazide (HYDRODIURIL) 25 MG tablet Take 1 tablet (25 mg total) by mouth daily. 90 tablet 3  . lisinopril (ZESTRIL) 20 MG tablet Take 1 tablet (20 mg total) by mouth daily. 90 tablet 3  . Multiple Vitamin (MULTIVITAMIN) tablet Take 1 tablet by mouth daily.      . potassium chloride SA (KLOR-CON) 20 MEQ tablet Take 1 tablet (20 mEq total) by mouth daily. 90 tablet 2  . propranolol (INDERAL) 10 MG tablet Take 1 tablet (10 mg total) by mouth 4 (four) times daily as needed (palpitations or fast heart rate). 90 tablet 3  . tamsulosin (FLOMAX) 0.4 MG CAPS capsule Take 1 capsule by mouth 2 (two) times a week.     No current facility-administered medications on file prior to visit.  Amlodipine 5 mg a day   Allergies  Allergen Reactions  . Losartan     Stomach cramps.    Past Medical History:  Diagnosis Date  . Cancer of kidney, secondary  (Brian Parker) 01/06/2010   right kidney//right kidney removed//left kidney functions WDL  . Chest pain    INTERMITTENT/15 years ago//stress induced  . Edema of lower extremity   . Elevated prostate specific antigen (PSA) 11/20/2011  . Follicular lymphoma grade I of intra-abdominal lymph nodes ([facility]) 12/31/2006  . History of B-cell lymphoma 12/05/2015  . Hypertension   . Kidney carcinoma ([place]) 03/01/2003   tumor seperate from lymphoma; found during routine screening for lymphoma  . NHL (non-Hodgkin's lymphoma) ([facility]) 12/06/2014  . Nodular lymphoma involving intra-abdominal lymph nodes ([facility]) 12/20/2011  . Non Hodgkin's lymphoma [facility])     Past Surgical History:  Procedure Laterality Date  . APPENDECTOMY    . CARDIOVASCULAR STRESS TEST  10/19/2008   EF 52%, NO ISCHEMIA  . GROIN MASS OPEN BIOPSY    . HERNIA REPAIR  2006-2007   right laparoscopic  . HERNIA REPAIR  2006-2007   left laparoscopic  . NEPHRECTOMY     right kidney  . SHOULDER SURGERY    . TOTAL KNEE ARTHROPLASTY Left 03/25/2013   Dr Marlou Sa  . TOTAL KNEE ARTHROPLASTY Left 03/24/2013   Procedure: LEFT TOTAL KNEE ARTHROPLASTY;  Surgeon: Meredith Pel, MD;  Location: [facility];  Service: Orthopedics;  Laterality: Left;  Left Total Knee Arthroplasty  . US ECHOCARDIOGRAPHY  02/26/2006   EF 55-60%  . VENTRAL HERNIA REPAIR  2003-2004   with mesh after biopsy    Social History   Tobacco Use  Smoking Status Never Smoker  Smokeless Tobacco Never Used    Social History   Substance and Sexual Activity  Alcohol Use Yes  . Alcohol/week: 7.0 standard drinks  . Types: 6 Glasses of wine, 1 Cans of beer per week   Comment: social    Family History  Problem Relation Age of Onset  . Uterine cancer Mother   . Cervical cancer Mother   . Coronary artery disease Father   . Hypertension Father     Reviw of Systems:  Reviewed in the HPI.  All other systems are negative.  Physical Exam: Blood pressure 110/84, pulse (!) 52, height 5\' 10"  (1.778  m), weight 226 lb (102.5 kg), SpO2 97 %.  GEN:  Well nourished, well developed in no  acute distress HEENT: Normal NECK: No JVD; No carotid bruits LYMPHATICS: No lymphadenopathy CARDIAC: RRR , no murmurs, rubs, gallops RESPIRATORY:  Clear to auscultation without rales, wheezing or rhonchi  ABDOMEN: Soft, non-tender, non-distended MUSCULOSKELETAL:  No edema; No deformity  SKIN: Warm and dry NEUROLOGIC:  Alert and oriented x 3   ECG:     Nov. 22, 2021:   Sinus brady .  Occasional PVCs.   Assessment / Plan:   1. Hypertension:    BP is well controlled , encouraged weight loss ,  He will try to start on  A weight loss program after the holidays   2. Premature ventricular contractions:   Stable ,  Asymptomatic    2. Non Hodgkins Lymphoma-  Stable     Mertie Moores, MD  11/21/2020 9:40 AM    [facility] [address],  [place], [place]  [address] Pager [phone] Phone: [phone]; Fax: [phone]

## 2020-11-21 ENCOUNTER — Encounter: Payer: Self-pay | Admitting: Cardiovascular Disease

## 2020-11-21 ENCOUNTER — Ambulatory Visit: Payer: Medicare HMO | Admitting: Cardiovascular Disease

## 2020-11-21 ENCOUNTER — Other Ambulatory Visit: Payer: Self-pay

## 2020-11-21 VITALS — BP 110/84 | HR 52 | Ht 70.0 in | Wt 226.0 lb

## 2020-11-21 DIAGNOSIS — I493 Ventricular premature depolarization: Secondary | ICD-10-CM

## 2020-11-21 DIAGNOSIS — I10 Essential (primary) hypertension: Secondary | ICD-10-CM

## 2020-11-21 LAB — BASIC METABOLIC PANEL
BUN/Creatinine Ratio: 15 (ref 10–24)
BUN: 14 mg/dL (ref 8–27)
CO2: 25 mmol/L (ref 20–29)
Calcium: 9.2 mg/dL (ref 8.6–10.2)
Chloride: 103 mmol/L (ref 96–106)
Creatinine, Ser: 0.91 mg/dL (ref 0.76–1.27)
GFR calc Af Amer: 100 mL/min/{1.73_m2} (ref >59)
GFR calc non Af Amer: 86 mL/min/{1.73_m2} (ref >59)
Glucose: 76 mg/dL (ref 65–99)
Potassium: 4 mmol/L (ref 3.5–5.2)
Sodium: 142 mmol/L (ref 134–144)

## 2020-11-21 MED ORDER — AMLODIPINE BESYLATE 5 MG PO TABS: 5.0000 mg | ORAL_TABLET | Freq: Every day | ORAL | 3 refills | Status: DC

## 2020-11-21 NOTE — Patient Instructions (Signed)
Medication Instructions:  Your physician recommends that you continue on your current medications as directed. Please refer to the Current Medication list given to you today.  *If you need a refill on your cardiac medications before your next appointment, please call your pharmacy*   Lab Work: TODAY - basic metabolic panel If you have labs (blood work) drawn today and your tests are completely normal, you will receive your results only by: [name] MyChart Message (if you have MyChart) OR . A paper copy in the mail If you have any lab test that is abnormal or we need to change your treatment, we will call you to review the results.   Testing/Procedures: None Ordered   Follow-Up: At [facility], you and your health needs are our priority.  As part of our continuing mission to provide you with exceptional heart care, we have created designated Provider Care Teams.  These Care Teams include your primary Cardiologist (physician) and Advanced Practice Providers (APPs -  Physician Assistants and Nurse Practitioners) who all work together to provide you with the care you need, when you need it.     Your next appointment:   1 year(s)  The format for your next appointment:   In Person  Provider:   You may see Mertie Moores, MD or one of the following Advanced Practice Providers on your designated Care Team:    Richardson Dopp, PA-C  [place], [place]

## 2020-12-06 ENCOUNTER — Other Ambulatory Visit: Payer: Medicare HMO

## 2020-12-06 ENCOUNTER — Ambulatory Visit: Payer: Medicare HMO | Admitting: Hematology and Oncology

## 2020-12-19 DIAGNOSIS — J209 Acute bronchitis, unspecified: Secondary | ICD-10-CM | POA: Diagnosis not present

## 2020-12-19 DIAGNOSIS — J22 Unspecified acute lower respiratory infection: Secondary | ICD-10-CM | POA: Diagnosis not present

## 2020-12-19 DIAGNOSIS — R059 Cough, unspecified: Secondary | ICD-10-CM | POA: Diagnosis not present

## 2020-12-19 DIAGNOSIS — Z03818 Encounter for observation for suspected exposure to other biological agents ruled out: Secondary | ICD-10-CM | POA: Diagnosis not present

## 2021-01-04 DIAGNOSIS — J40 Bronchitis, not specified as acute or chronic: Secondary | ICD-10-CM | POA: Diagnosis not present

## 2021-01-24 DIAGNOSIS — D2272 Melanocytic nevi of left lower limb, including hip: Secondary | ICD-10-CM | POA: Diagnosis not present

## 2021-01-24 DIAGNOSIS — B351 Tinea unguium: Secondary | ICD-10-CM | POA: Diagnosis not present

## 2021-01-24 DIAGNOSIS — D225 Melanocytic nevi of trunk: Secondary | ICD-10-CM | POA: Diagnosis not present

## 2021-01-24 DIAGNOSIS — D2262 Melanocytic nevi of left upper limb, including shoulder: Secondary | ICD-10-CM | POA: Diagnosis not present

## 2021-01-24 DIAGNOSIS — L720 Epidermal cyst: Secondary | ICD-10-CM | POA: Diagnosis not present

## 2021-01-24 DIAGNOSIS — L570 Actinic keratosis: Secondary | ICD-10-CM | POA: Diagnosis not present

## 2021-01-24 DIAGNOSIS — D1801 Hemangioma of skin and subcutaneous tissue: Secondary | ICD-10-CM | POA: Diagnosis not present

## 2021-01-24 DIAGNOSIS — L308 Other specified dermatitis: Secondary | ICD-10-CM | POA: Diagnosis not present

## 2021-01-24 DIAGNOSIS — L821 Other seborrheic keratosis: Secondary | ICD-10-CM | POA: Diagnosis not present

## 2021-03-12 ENCOUNTER — Other Ambulatory Visit: Payer: Self-pay | Admitting: Cardiovascular Disease

## 2021-03-14 MED ORDER — DOXAZOSIN MESYLATE 2 MG PO TABS: 2.0000 mg | ORAL_TABLET | Freq: Every day | ORAL | 2 refills | Status: DC

## 2021-05-01 ENCOUNTER — Encounter: Payer: Self-pay | Admitting: Hematology and Oncology

## 2021-05-04 ENCOUNTER — Other Ambulatory Visit: Payer: Self-pay | Admitting: Hematology and Oncology

## 2021-05-04 DIAGNOSIS — Z8572 Personal history of non-Hodgkin lymphomas: Secondary | ICD-10-CM

## 2021-05-05 ENCOUNTER — Other Ambulatory Visit: Payer: Self-pay

## 2021-05-05 ENCOUNTER — Encounter: Payer: Self-pay | Admitting: Hematology and Oncology

## 2021-05-05 ENCOUNTER — Inpatient Hospital Stay (HOSPITAL_BASED_OUTPATIENT_CLINIC_OR_DEPARTMENT_OTHER): Payer: Medicare HMO | Admitting: Hematology and Oncology

## 2021-05-05 ENCOUNTER — Inpatient Hospital Stay: Payer: Medicare HMO | Attending: Hematology and Oncology

## 2021-05-05 DIAGNOSIS — Z8572 Personal history of non-Hodgkin lymphomas: Secondary | ICD-10-CM

## 2021-05-05 DIAGNOSIS — R109 Unspecified abdominal pain: Secondary | ICD-10-CM | POA: Insufficient documentation

## 2021-05-05 DIAGNOSIS — E669 Obesity, unspecified: Secondary | ICD-10-CM | POA: Diagnosis not present

## 2021-05-05 DIAGNOSIS — R053 Chronic cough: Secondary | ICD-10-CM | POA: Insufficient documentation

## 2021-05-05 LAB — CBC WITH DIFFERENTIAL/PLATELET
Abs Immature Granulocytes: 0.01 10*3/uL (ref 0.00–0.07)
Basophils Absolute: 0 10*3/uL (ref 0.0–0.1)
Basophils Relative: 1 %
Eosinophils Absolute: 0.2 10*3/uL (ref 0.0–0.5)
Eosinophils Relative: 3 %
HCT: 46.6 % (ref 39.0–52.0)
Hemoglobin: 15.8 g/dL (ref 13.0–17.0)
Immature Granulocytes: 0 %
Lymphocytes Relative: 17 %
Lymphs Abs: 1 10*3/uL (ref 0.7–4.0)
MCH: 30.2 pg (ref 26.0–34.0)
MCHC: 33.9 g/dL (ref 30.0–36.0)
MCV: 88.9 fL (ref 80.0–100.0)
Monocytes Absolute: 0.4 10*3/uL (ref 0.1–1.0)
Monocytes Relative: 6 %
Neutro Abs: 4.5 10*3/uL (ref 1.7–7.7)
Neutrophils Relative %: 73 %
Platelets: 171 10*3/uL (ref 150–400)
RBC: 5.24 MIL/uL (ref 4.22–5.81)
RDW: 12.9 % (ref 11.5–15.5)
WBC: 6.1 10*3/uL (ref 4.0–10.5)
nRBC: 0 % (ref 0.0–0.2)

## 2021-05-05 LAB — COMPREHENSIVE METABOLIC PANEL
ALT: 13 U/L (ref 0–44)
AST: 17 U/L (ref 15–41)
Albumin: 4.1 g/dL (ref 3.5–5.0)
Alkaline Phosphatase: 62 U/L (ref 38–126)
Anion gap: 8 (ref 5–15)
BUN: 14 mg/dL (ref 8–23)
CO2: 26 mmol/L (ref 22–32)
Calcium: 9.1 mg/dL (ref 8.9–10.3)
Chloride: 106 mmol/L (ref 98–111)
Creatinine, Ser: 1 mg/dL (ref 0.61–1.24)
GFR, Estimated: >60 mL/min (ref >60)
Glucose, Bld: 96 mg/dL (ref 70–99)
Potassium: 3.7 mmol/L (ref 3.5–5.1)
Sodium: 140 mmol/L (ref 135–145)
Total Bilirubin: 0.9 mg/dL (ref 0.3–1.2)
Total Protein: 6.5 g/dL (ref 6.5–8.1)

## 2021-05-05 LAB — LACTATE DEHYDROGENASE: LDH: 216 U/L — ABNORMAL HIGH (ref 98–192)

## 2021-05-05 NOTE — Progress Notes (Signed)
[facility] OFFICE PROGRESS NOTE  Patient Care Team: Hulan Fess, MD as PCP - General (Family Medicine) Nahser, Wonda Cheng, MD as PCP - Cardiology (Cardiology) Arloa Koh, MD (Inactive) as Consulting Physician (Radiation Oncology) Kathie Rhodes, MD (Inactive) as Consulting Physician (Urology) Heath Lark, MD as Consulting Physician (Hematology and Oncology)  ASSESSMENT & PLAN:  History of B-cell lymphoma His last CT imaging show no evidence of disease His symptoms are not consistent with cancer recurrence The patient is reassured I will not order routine surveillance imaging unless he has signs or symptoms of cancer recurrence I will see him in 1 year  Chronic cough His recent chronic cough could be attributed to allergies and possibly side effects of ACE inhibitor I recommend discontinuation of lisinopril and take over-the-counter antihistamines We also discussed the importance of deep breathing exercises  Class 1 obesity He has not lost weight We discussed the importance of alcohol cessation and increase physical activity and aggressive dietary modification  Abdominal wall pain in right flank His chronic intermittent right flank pain is not related to cancer recurrence He has defect from prior surgery and his chronic cough probably exacerbated muscular strain The patient is reassured   No orders of the defined types were placed in this encounter.   All questions were answered. The patient knows to call the clinic with any problems, questions or concerns. The total time spent in the appointment was 25 minutes encounter with patients including review of chart and various tests results, discussions about plan of care and coordination of care plan   Heath Lark, MD 05/05/2021 2:54 PM  INTERVAL HISTORY: Please see below for problem oriented charting. He returns for further follow-up He is somewhat anxious In January, he developed bronchitis and was treated  with 2 courses of antibiotics His cough gradually improved but it took a long time He has mild productive phlegm and takes Mucinex He did suffer from some allergies recently He continues to complain of right flank pain/abdominal discomfort that comes and goes, not related to food or activity He has not lost any weight since last time I saw him  SUMMARY OF ONCOLOGIC HISTORY: Oncology History  History of non-Hodgkin's lymphoma  12/31/2001 Initial Diagnosis   He was initially diagnosed with a follicular grade 1,  B-cell, non-Hodgkin's lymphoma in 2003 .  He has also been diagnosed with metachronous primary tumors of his right kidney and ultimately underwent initial partial nephrectomy in March 2004 then a completion nephrectomy in January 2011. He was treated with 8 cycles of CHOP/Rituxan through July 2003. He was started on maintenance rituximab but only had 2 cycles due to development of delayed neutropenia.  There was suspicion for early progression on a CT scan done in July of 2009 which showed a single area of soft tissue density in the left periaortic region. This area remained stable on serial scans until a study done on 11/13/2011 which did show clear regrowth in this area enlarging from 2.6 x 0.9 cm to approximately 4.9 x 3.4 cm. A PET scan was done on 11/30/2011 which showed a single area of markedly abnormal activity with SUV up to 10.3 over the abnormal area of lymphadenopathy seen on CT scan. There were no other areas of activity.  He received 3000 cGy in 15 fractions to the left para-aortic lymph node mass between January 16 and 02/04/2012.    12/10/2017 Imaging   1. Stable CT of the abdomen and pelvis. No evidence for recurrent lymphoma. 2.  Status post right nephrectomy. No evidence for local tumor recurrence or metastatic disease. 3. Status post repair of ventral abdominal wall hernia.   05/11/2020 Imaging   1. No findings of recurrent lymphoma. 2. Other imaging findings of  potential clinical significance: Sigmoid colon diverticulosis. Stable prostatomegaly. 3. Aortic atherosclerosis.   Aortic Atherosclerosis (ICD10-I70.0).       REVIEW OF SYSTEMS:   Constitutional: Denies fevers, chills or abnormal weight loss Eyes: Denies blurriness of vision Ears, nose, mouth, throat, and face: Denies mucositis or sore throat Cardiovascular: Denies palpitation, chest discomfort or lower extremity swelling Gastrointestinal:  Denies nausea, heartburn or change in bowel habits Skin: Denies abnormal skin rashes Lymphatics: Denies new lymphadenopathy or easy bruising Neurological:Denies numbness, tingling or new weaknesses Behavioral/Psych: Mood is stable, no new changes  All other systems were reviewed with the patient and are negative.  I have reviewed the past medical history, past surgical history, social history and family history with the patient and they are unchanged from previous note.  ALLERGIES:  is allergic to losartan.  MEDICATIONS:  Current Outpatient Medications  Medication Sig Dispense Refill  . amLODipine (NORVASC) 5 MG tablet Take 1 tablet (5 mg total) by mouth daily. 90 tablet 3  . aspirin EC 81 MG tablet Take 81 mg by mouth daily.      [name] doxazosin (CARDURA) 2 MG tablet Take 1 tablet (2 mg total) by mouth at bedtime. 90 tablet 2  . Fish Oil-Krill Oil CAPS Take 1 capsule by mouth daily.    . hydrochlorothiazide (HYDRODIURIL) 25 MG tablet Take 1 tablet (25 mg total) by mouth daily. 90 tablet 3  . lisinopril (ZESTRIL) 20 MG tablet Take 1 tablet (20 mg total) by mouth daily. 90 tablet 3  . Multiple Vitamin (MULTIVITAMIN) tablet Take 1 tablet by mouth daily.      . tamsulosin (FLOMAX) 0.4 MG CAPS capsule Take 1 capsule by mouth 2 (two) times a week.     No current facility-administered medications for this visit.    PHYSICAL EXAMINATION: ECOG PERFORMANCE STATUS: 1 - Symptomatic but completely ambulatory  Vitals:   05/05/21 1149  BP: 128/85   Pulse: 71  Resp: 18  SpO2: 98%   Filed Weights   05/05/21 1149  Weight: 229 lb (103.9 kg)    GENERAL:alert, no distress and comfortable SKIN: skin color, texture, turgor are normal, no rashes or significant lesions EYES: normal, Conjunctiva are pink and non-injected, sclera clear OROPHARYNX:no exudate, no erythema and lips, buccal mucosa, and tongue normal  NECK: supple, thyroid normal size, non-tender, without nodularity LYMPH:  no palpable lymphadenopathy in the cervical, axillary or inguinal LUNGS: clear to auscultation and percussion with normal breathing effort HEART: regular rate & rhythm and no murmurs and no lower extremity edema ABDOMEN:abdomen soft, non-tender and normal bowel sounds Musculoskeletal:no cyanosis of digits and no clubbing  NEURO: alert & oriented x 3 with fluent speech, no focal motor/sensory deficits  LABORATORY DATA:  I have reviewed the data as listed    Component Value Date/Time   NA 140 05/05/2021 1107   NA 142 11/21/2020 0952   NA 141 12/05/2017 1013   K 3.7 05/05/2021 1107   K 3.9 12/05/2017 1013   CL 106 05/05/2021 1107   CL 105 03/02/2013 0818   CO2 26 05/05/2021 1107   CO2 27 12/05/2017 1013   GLUCOSE 96 05/05/2021 1107   GLUCOSE 84 12/05/2017 1013   GLUCOSE 88 03/02/2013 0818   BUN 14 05/05/2021 1107   BUN  14 11/21/2020 0952   BUN 16.9 12/05/2017 1013   CREATININE 1.00 05/05/2021 1107   CREATININE 1.1 12/05/2017 1013   CALCIUM 9.1 05/05/2021 1107   CALCIUM 9.3 12/05/2017 1013   PROT 6.5 05/05/2021 1107   PROT 7.0 12/05/2017 1013   ALBUMIN 4.1 05/05/2021 1107   ALBUMIN 4.4 12/05/2017 1013   AST 17 05/05/2021 1107   AST 20 12/05/2017 1013   ALT 13 05/05/2021 1107   ALT 18 12/05/2017 1013   ALKPHOS 62 05/05/2021 1107   ALKPHOS 71 12/05/2017 1013   BILITOT 0.9 05/05/2021 1107   BILITOT 0.75 12/05/2017 1013   GFRNONAA >60 05/05/2021 1107   GFRAA 100 11/21/2020 0952    No results found for: SPEP, UPEP  Lab Results   Component Value Date   WBC 6.1 05/05/2021   NEUTROABS 4.5 05/05/2021   HGB 15.8 05/05/2021   HCT 46.6 05/05/2021   MCV 88.9 05/05/2021   PLT 171 05/05/2021      Chemistry      Component Value Date/Time   NA 140 05/05/2021 1107   NA 142 11/21/2020 0952   NA 141 12/05/2017 1013   K 3.7 05/05/2021 1107   K 3.9 12/05/2017 1013   CL 106 05/05/2021 1107   CL 105 03/02/2013 0818   CO2 26 05/05/2021 1107   CO2 27 12/05/2017 1013   BUN 14 05/05/2021 1107   BUN 14 11/21/2020 0952   BUN 16.9 12/05/2017 1013   CREATININE 1.00 05/05/2021 1107   CREATININE 1.1 12/05/2017 1013      Component Value Date/Time   CALCIUM 9.1 05/05/2021 1107   CALCIUM 9.3 12/05/2017 1013   ALKPHOS 62 05/05/2021 1107   ALKPHOS 71 12/05/2017 1013   AST 17 05/05/2021 1107   AST 20 12/05/2017 1013   ALT 13 05/05/2021 1107   ALT 18 12/05/2017 1013   BILITOT 0.9 05/05/2021 1107   BILITOT 0.75 12/05/2017 1013

## 2021-05-05 NOTE — Assessment & Plan Note (Signed)
He has not lost weight We discussed the importance of alcohol cessation and increase physical activity and aggressive dietary modification

## 2021-05-05 NOTE — Assessment & Plan Note (Signed)
His chronic intermittent right flank pain is not related to cancer recurrence He has defect from prior surgery and his chronic cough probably exacerbated muscular strain The patient is reassured

## 2021-05-05 NOTE — Assessment & Plan Note (Signed)
His last CT imaging show no evidence of disease ?His symptoms are not consistent with cancer recurrence ?The patient is reassured ?I will not order routine surveillance imaging unless he has signs or symptoms of cancer recurrence ?I will see him in 1 year ?

## 2021-05-05 NOTE — Assessment & Plan Note (Signed)
His recent chronic cough could be attributed to allergies and possibly side effects of ACE inhibitor I recommend discontinuation of lisinopril and take over-the-counter antihistamines We also discussed the importance of deep breathing exercises

## 2021-05-10 ENCOUNTER — Telehealth: Payer: Self-pay | Admitting: Hematology and Oncology

## 2021-05-10 NOTE — Telephone Encounter (Signed)
Scheduled appt per 5/6 sch msg. Mailed updated calendar to pt.

## 2021-05-12 ENCOUNTER — Other Ambulatory Visit: Payer: Medicare HMO

## 2021-05-12 ENCOUNTER — Ambulatory Visit: Payer: Medicare HMO | Admitting: Hematology and Oncology

## 2021-06-15 ENCOUNTER — Other Ambulatory Visit: Payer: Self-pay | Admitting: Cardiovascular Disease

## 2021-06-15 NOTE — Telephone Encounter (Signed)
Rx(s) sent to pharmacy electronically.  

## 2021-07-17 ENCOUNTER — Telehealth: Payer: Self-pay | Admitting: Cardiovascular Disease

## 2021-07-17 NOTE — Telephone Encounter (Signed)
Pt is returning a call  

## 2021-07-17 NOTE — Telephone Encounter (Signed)
Left message to call back  

## 2021-07-17 NOTE — Telephone Encounter (Signed)
Patient requesting a call back from the nurse, did not want to go into deal. Just wanted to speak with her so she can speak with Dr. Acie Fredrickson.

## 2021-07-17 NOTE — Telephone Encounter (Signed)
Pt returning your call and provided callback numbers:    [phone]  Please advise

## 2021-07-17 NOTE — Telephone Encounter (Signed)
Pt reports he has a real bad chest cold and was out in the heat yesterday.  He stated his BP dropped very low and HR was up to 120 bpm for about 5 hours.  Today his HR in starting around 90-100 bpm which is high for him.  He is also having a lot more PVCs than is normal for him.  He would like to be seen by Dr Acie Fredrickson.  He has an appt tomorrow with his PCP.  Advised I will review with Dr Acie Fredrickson and call back with further instructions.  He will await a call back for further instructions.

## 2021-07-18 ENCOUNTER — Encounter: Payer: Self-pay | Admitting: Cardiovascular Disease

## 2021-07-18 DIAGNOSIS — R0989 Other specified symptoms and signs involving the circulatory and respiratory systems: Secondary | ICD-10-CM | POA: Diagnosis not present

## 2021-07-18 DIAGNOSIS — I499 Cardiac arrhythmia, unspecified: Secondary | ICD-10-CM | POA: Diagnosis not present

## 2021-07-18 NOTE — Progress Notes (Signed)
Brian Parker. Date of Birth  February 12, 1950 [facility]        [address]    [place]     [place], [place]  [address]      [phone]  Fax  [phone]     1. Hypertension 2. Non Hodgkins Lymphoma 3.  PVCs  4.  Atrial flutter :  Brian Parker has done well from a cardiac standpoint.  He recently had XRT for a growing lymph node in his abdomen.   His BP has been well controlled at home.    January 29, 2013: He continues to have issues with his non-hodgkins lymphoma.  He had XTR and has been declared in remission .  He had a  Left leg puncture would from a drill bit - never really healed well.  He recently had this would debrided and is scheduled to have a left knee replacement once this has healed.  He has not been able to exercise because of these leg issues.   Nov. 13, 2014: Brian Parker is doing well. His BP has been well controlled.  His lymphoma is still in remission.  Playing golf regularly.   May 19, 2014: Brian Parker is doing well.  Playing some golf.   BP has been well controlled.    He has been playing golf in the evenings ( walks 9 holes).   He is slowing losing wome weight.    June 27, 2015:   Brian Parker is doing well.  No CP .  Playing golf regularly .  Has been tracking what he eats .  Has been measuring his BP  several times a day   Sept. 1, 2017:  Doing well. Just got back from [place] playing golf.   Played with [name].  Is active.  Also went to [place] and [place].   Walking regularly .  Lymphoma appears to be under good control   Feb. 27, 2018 Doing well Has felt some palpitations Occurs several times ( 15-20 times this AM already )  Not related to activity Seems to be worse when he is sitting  Not associated with CP or dyspnea or pre-syncope.  Went to the ER , was found to have PVCs  Potassium was low. Sleeping well, more caffeine recently  Walks regularly  - walked 18 holes of golf on Sunday , no issues with walking   03/31/18  Feeling  well. BP is a bit higher today . Perhaps ate more salt than he should  No CP or dyspnea.  Has occasional Palpitations .  Has propranolol - does not take it  Snores,   Asked about OSA   September 22, 2018: Brian Parker is doing well.  BP is well controlled  has occasional PVCs  Playing golf regularly .   Walks most of the time   October 08, 2019: Brian Parker is seen today for follow-up visit.  He has a history of palpitations and has had PVCs diagnosed by EKG.  He has propanolol to take on an as-needed basis ( he has never taken)  He plays golf on a regular basis. No CP , no dyspnea   Nov. 22, 2021: Brian Parker is seen today for follow up of his palpitations /PVCs, HTN. Golfing is going well  No CP ,    July 19, 2021: Brian Parker is seen today for follow up of his PVCs, HTN. Hx  of lymphoma He has been having more palpitations recently   Has had a  recurrent chest cold  On multiple coarses of Abx  Saw Dr. Jacelyn Grip  Thought his HR was elevated.   Would not slow down  Tried several propranolol 10 mg - seemed to help  Constant for the past 3 days   CHADS2VASC is 2   ( age, HTN, )  Current Outpatient Medications on File Prior to Visit  Medication Sig Dispense Refill   amLODipine (NORVASC) 5 MG tablet Take 1 tablet (5 mg total) by mouth daily. 90 tablet 3   doxazosin (CARDURA) 2 MG tablet Take 1 tablet (2 mg total) by mouth at bedtime. 90 tablet 2   Fish Oil-Krill Oil CAPS Take 1 capsule by mouth daily.     hydrochlorothiazide (HYDRODIURIL) 25 MG tablet Take 1 tablet (25 mg total) by mouth daily. 90 tablet 3   lisinopril (ZESTRIL) 20 MG tablet Take 1 tablet by mouth once daily 90 tablet 0   Multiple Vitamin (MULTIVITAMIN) tablet Take 1 tablet by mouth daily.       tamsulosin (FLOMAX) 0.4 MG CAPS capsule Take 1 capsule by mouth 2 (two) times a week.     No current facility-administered medications on file prior to visit.  Amlodipine 5 mg a day   Allergies  Allergen Reactions   Losartan Other (See  Comments)    Stomach cramps.    Past Medical History:  Diagnosis Date   Cancer of kidney, secondary ([facility]) 01/06/2010   right kidney//right kidney removed//left kidney functions WDL   Chest pain    INTERMITTENT/15 years ago//stress induced   Edema of lower extremity    Elevated prostate specific antigen (PSA) 84/16/6063   Follicular lymphoma grade I of intra-abdominal lymph nodes ([facility]) 12/31/2006   History of B-cell lymphoma 12/05/2015   Hypertension    Kidney carcinoma ([place] [facility]) 03/01/2003   tumor seperate from lymphoma; found during routine screening for lymphoma   NHL (non-Hodgkin's lymphoma) ([facility]) 12/06/2014   Nodular lymphoma involving intra-abdominal lymph nodes ([facility]) 12/20/2011   Non Hodgkin's lymphoma ([place])     Past Surgical History:  Procedure Laterality Date   APPENDECTOMY     CARDIOVASCULAR STRESS TEST  10/19/2008   EF 52%, NO ISCHEMIA   GROIN MASS OPEN BIOPSY     HERNIA REPAIR  2006-2007   right laparoscopic   HERNIA REPAIR  2006-2007   left laparoscopic   NEPHRECTOMY     right kidney   SHOULDER SURGERY     TOTAL KNEE ARTHROPLASTY Left 03/25/2013   Dr Marlou Sa   TOTAL KNEE ARTHROPLASTY Left 03/24/2013   Procedure: LEFT TOTAL KNEE ARTHROPLASTY;  Surgeon: Meredith Pel, MD;  Location: [facility];  Service: Orthopedics;  Laterality: Left;  Left Total Knee Arthroplasty   US ECHOCARDIOGRAPHY  02/26/2006   EF 55-60%   VENTRAL HERNIA REPAIR  2003-2004   with mesh after biopsy    Social History   Tobacco Use  Smoking Status Never  Smokeless Tobacco Never    Social History   Substance and Sexual Activity  Alcohol Use Yes   Alcohol/week: 7.0 standard drinks   Types: 6 Glasses of wine, 1 Cans of beer per week   Comment: social    Family History  Problem Relation Age of Onset   Uterine cancer Mother    Cervical cancer Mother    Coronary artery disease Father    Hypertension Father     Reviw of Systems:  Reviewed in the HPI.  All other systems are  negative.  Physical Exam: Blood pressure 132/80, pulse [name])  110, height 5\' 10"  (1.778 m), weight 221 lb 12.8 oz (100.6 kg), SpO2 97 %.  GEN:  Well nourished, well developed in no acute distress HEENT: Normal NECK: No JVD; No carotid bruits LYMPHATICS: No lymphadenopathy CARDIAC: RRR , no murmurs, rubs, gallops RESPIRATORY:  Clear to auscultation without rales, wheezing or rhonchi  ABDOMEN: Soft, non-tender, non-distended MUSCULOSKELETAL:  No edema; No deformity  SKIN: Warm and dry NEUROLOGIC:  Alert and oriented x 3    ECG:     July 19, 2021: Atrial flutter with rapid ventricular response of 110 beats minute.  Assessment / Plan:   1.  New onset atrial flutter with RVR: Brian Parker presents with new onset atrial flutter.  He has been it for 3 days.  He is has some discomfort and some shortness of breath and a chronic cough.  I would like to check a TSH, CBC, basic metabolic profile.  We will schedule him for a TEE cardioversion next week.  We will start metoprolol 25 mg twice a day.  I will give him a refill of his propranolol to take on an as-needed basis.  I will see him back in 3 months.  2. Premature ventricular contractions:       2. Non Hodgkins Lymphoma-       Mertie Moores, MD  07/19/2021 5:56 PM    [address],  [place], [place]  [address] Pager [phone] Phone: [phone]; Fax: [phone]

## 2021-07-18 NOTE — Telephone Encounter (Signed)
Left message to call back to discuss his concerns.  

## 2021-07-18 NOTE — Telephone Encounter (Signed)
Patient is following up, requesting advisement ASAP. He states he spoke with his PCP this morning and PCP states it is urgent. Please return call when able.

## 2021-07-18 NOTE — H&P (View-Only) (Signed)
Brian Parker. Date of Birth  February 12, 1950 [facility]        [address]    [place]     [place], [place]  [address]      [phone]  Fax  [phone]     1. Hypertension 2. Non Hodgkins Lymphoma 3.  PVCs  4.  Atrial flutter :  Brian Parker has done well from a cardiac standpoint.  He recently had XRT for a growing lymph node in his abdomen.   His BP has been well controlled at home.    January 29, 2013: He continues to have issues with his non-hodgkins lymphoma.  He had XTR and has been declared in remission .  He had a  Left leg puncture would from a drill bit - never really healed well.  He recently had this would debrided and is scheduled to have a left knee replacement once this has healed.  He has not been able to exercise because of these leg issues.   Nov. 13, 2014: Brian Parker is doing well. His BP has been well controlled.  His lymphoma is still in remission.  Playing golf regularly.   May 19, 2014: Brian Parker is doing well.  Playing some golf.   BP has been well controlled.    He has been playing golf in the evenings ( walks 9 holes).   He is slowing losing wome weight.    June 27, 2015:   Brian Parker is doing well.  No CP .  Playing golf regularly .  Has been tracking what he eats .  Has been measuring his BP  several times a day   Sept. 1, 2017:  Doing well. Just got back from [place] playing golf.   Played with Brian Parker.  Is active.  Also went to [place] and [place].   Walking regularly .  Lymphoma appears to be under good control   Feb. 27, 2018 Doing well Has felt some palpitations Occurs several times ( 15-20 times this AM already )  Not related to activity Seems to be worse when he is sitting  Not associated with CP or dyspnea or pre-syncope.  Went to the ER , was found to have PVCs  Potassium was low. Sleeping well, more caffeine recently  Walks regularly  - walked 18 holes of golf on Sunday , no issues with walking   03/31/18  Feeling  well. BP is a bit higher today . Perhaps ate more salt than he should  No CP or dyspnea.  Has occasional Palpitations .  Has propranolol - does not take it  Snores,   Asked about OSA   September 22, 2018: Brian Parker is doing well.  BP is well controlled  has occasional PVCs  Playing golf regularly .   Walks most of the time   October 08, 2019: Brian Parker is seen today for follow-up visit.  He has a history of palpitations and has had PVCs diagnosed by EKG.  He has propanolol to take on an as-needed basis ( he has never taken)  He plays golf on a regular basis. No CP , no dyspnea   Nov. 22, 2021: Brian Parker is seen today for follow up of his palpitations /PVCs, HTN. Golfing is going well  No CP ,    July 19, 2021: Brian Parker is seen today for follow up of his PVCs, HTN. Hx  of lymphoma He has been having more palpitations recently   Has had a  recurrent chest cold  On multiple coarses of Abx  Saw Dr. Jacelyn Grip  Thought his HR was elevated.   Would not slow down  Tried several propranolol 10 mg - seemed to help  Constant for the past 3 days   CHADS2VASC is 2   ( age, HTN, )  Current Outpatient Medications on File Prior to Visit  Medication Sig Dispense Refill   amLODipine (NORVASC) 5 MG tablet Take 1 tablet (5 mg total) by mouth daily. 90 tablet 3   doxazosin (CARDURA) 2 MG tablet Take 1 tablet (2 mg total) by mouth at bedtime. 90 tablet 2   Fish Oil-Krill Oil CAPS Take 1 capsule by mouth daily.     hydrochlorothiazide (HYDRODIURIL) 25 MG tablet Take 1 tablet (25 mg total) by mouth daily. 90 tablet 3   lisinopril (ZESTRIL) 20 MG tablet Take 1 tablet by mouth once daily 90 tablet 0   Multiple Vitamin (MULTIVITAMIN) tablet Take 1 tablet by mouth daily.       tamsulosin (FLOMAX) 0.4 MG CAPS capsule Take 1 capsule by mouth 2 (two) times a week.     No current facility-administered medications on file prior to visit.  Amlodipine 5 mg a day   Allergies  Allergen Reactions   Losartan Other (See  Comments)    Stomach cramps.    Past Medical History:  Diagnosis Date   Cancer of kidney, secondary ([place]) 01/06/2010   right kidney//right kidney removed//left kidney functions WDL   Chest pain    INTERMITTENT/15 years ago//stress induced   Edema of lower extremity    Elevated prostate specific antigen (PSA) 32/35/5732   Follicular lymphoma grade I of intra-abdominal lymph nodes ([place]) 12/31/2006   History of B-cell lymphoma 12/05/2015   Hypertension    Kidney carcinoma ([place]) 03/01/2003   tumor seperate from lymphoma; found during routine screening for lymphoma   NHL (non-Hodgkin's lymphoma) ([place]) 12/06/2014   Nodular lymphoma involving intra-abdominal lymph nodes ([place]) 12/20/2011   Non Hodgkin's lymphoma ([place])     Past Surgical History:  Procedure Laterality Date   APPENDECTOMY     CARDIOVASCULAR STRESS TEST  10/19/2008   EF 52%, NO ISCHEMIA   GROIN MASS OPEN BIOPSY     HERNIA REPAIR  2006-2007   right laparoscopic   HERNIA REPAIR  2006-2007   left laparoscopic   NEPHRECTOMY     right kidney   SHOULDER SURGERY     TOTAL KNEE ARTHROPLASTY Left 03/25/2013   Dr Marlou Sa   TOTAL KNEE ARTHROPLASTY Left 03/24/2013   Procedure: LEFT TOTAL KNEE ARTHROPLASTY;  Surgeon: Meredith Pel, MD;  Location: [facility];  Service: Orthopedics;  Laterality: Left;  Left Total Knee Arthroplasty   US ECHOCARDIOGRAPHY  02/26/2006   EF 55-60%   VENTRAL HERNIA REPAIR  2003-2004   with mesh after biopsy    Social History   Tobacco Use  Smoking Status Never  Smokeless Tobacco Never    Social History   Substance and Sexual Activity  Alcohol Use Yes   Alcohol/week: 7.0 standard drinks   Types: 6 Glasses of wine, 1 Cans of beer per week   Comment: social    Family History  Problem Relation Age of Onset   Uterine cancer Mother    Cervical cancer Mother    Coronary artery disease Father    Hypertension Father     Reviw of Systems:  Reviewed in the HPI.  All other systems are  negative.  Physical Exam: Blood pressure 132/80, pulse [name])  110, height 5\' 10"  (1.778 m), weight 221 lb 12.8 oz (100.6 kg), SpO2 97 %.  GEN:  Well nourished, well developed in no acute distress HEENT: Normal NECK: No JVD; No carotid bruits LYMPHATICS: No lymphadenopathy CARDIAC: RRR , no murmurs, rubs, gallops RESPIRATORY:  Clear to auscultation without rales, wheezing or rhonchi  ABDOMEN: Soft, non-tender, non-distended MUSCULOSKELETAL:  No edema; No deformity  SKIN: Warm and dry NEUROLOGIC:  Alert and oriented x 3    ECG:     July 19, 2021: Atrial flutter with rapid ventricular response of 110 beats minute.  Assessment / Plan:   1.  New onset atrial flutter with RVR: Brian Parker presents with new onset atrial flutter.  He has been it for 3 days.  He is has some discomfort and some shortness of breath and a chronic cough.  I would like to check a TSH, CBC, basic metabolic profile.  We will schedule him for a TEE cardioversion next week.  We will start metoprolol 25 mg twice a day.  I will give him a refill of his propranolol to take on an as-needed basis.  I will see him back in 3 months.  2. Premature ventricular contractions:       2. Non Hodgkins Lymphoma-       Mertie Moores, MD  07/19/2021 5:56 PM    [address],  [place], [place]  [address] Pager [phone] Phone: [phone]; Fax: [phone]

## 2021-07-18 NOTE — Telephone Encounter (Signed)
Spoke with pt who saw Dr Jacelyn Grip this AM.  Pt reports Dr Jacelyn Grip stated he needed to be see by cardiology as soon as possible for possible At Fib. Pt reports he has not had an EKG.  Pt is not currently having an active complains.  Pt scheduled to see Dr Acie Fredrickson 7/20 at 9:20 am. Dr Acie Fredrickson is aware of the above information.

## 2021-07-18 NOTE — Telephone Encounter (Signed)
Patient returning call.

## 2021-07-19 ENCOUNTER — Encounter: Payer: Self-pay | Admitting: Cardiovascular Disease

## 2021-07-19 ENCOUNTER — Telehealth: Payer: Self-pay | Admitting: Nurse Practitioner

## 2021-07-19 ENCOUNTER — Other Ambulatory Visit: Payer: Self-pay

## 2021-07-19 ENCOUNTER — Ambulatory Visit: Payer: Medicare HMO | Admitting: Cardiovascular Disease

## 2021-07-19 VITALS — BP 132/80 | HR 110 | Ht 70.0 in | Wt 221.8 lb

## 2021-07-19 DIAGNOSIS — I493 Ventricular premature depolarization: Secondary | ICD-10-CM

## 2021-07-19 DIAGNOSIS — I4892 Unspecified atrial flutter: Secondary | ICD-10-CM | POA: Diagnosis not present

## 2021-07-19 DIAGNOSIS — I10 Essential (primary) hypertension: Secondary | ICD-10-CM

## 2021-07-19 LAB — CBC
Hematocrit: 50 % (ref 37.5–51.0)
Hemoglobin: 17 g/dL (ref 13.0–17.7)
MCH: 30.5 pg (ref 26.6–33.0)
MCHC: 34 g/dL (ref 31.5–35.7)
MCV: 90 fL (ref 79–97)
Platelets: 193 10*3/uL (ref 150–450)
RBC: 5.57 x10E6/uL (ref 4.14–5.80)
RDW: 12.6 % (ref 11.6–15.4)
WBC: 9.6 10*3/uL (ref 3.4–10.8)

## 2021-07-19 LAB — BASIC METABOLIC PANEL
BUN/Creatinine Ratio: 15 (ref 10–24)
BUN: 17 mg/dL (ref 8–27)
CO2: 24 mmol/L (ref 20–29)
Calcium: 9.5 mg/dL (ref 8.6–10.2)
Chloride: 99 mmol/L (ref 96–106)
Creatinine, Ser: 1.12 mg/dL (ref 0.76–1.27)
Glucose: 97 mg/dL (ref 65–99)
Potassium: 4.2 mmol/L (ref 3.5–5.2)
Sodium: 140 mmol/L (ref 134–144)
eGFR: 72 mL/min/{1.73_m2} (ref >59)

## 2021-07-19 LAB — TSH: TSH: 1.22 u[IU]/mL (ref 0.450–4.500)

## 2021-07-19 MED ORDER — METOPROLOL TARTRATE 25 MG PO TABS
25.0000 mg | ORAL_TABLET | Freq: Two times a day (BID) | ORAL | 11 refills | Status: DC
Start: 2021-07-19 — End: 2022-08-08

## 2021-07-19 MED ORDER — APIXABAN 5 MG PO TABS
5.0000 mg | ORAL_TABLET | Freq: Two times a day (BID) | ORAL | 11 refills | Status: DC
Start: 2021-07-19 — End: 2022-08-27

## 2021-07-19 MED ORDER — PROPRANOLOL HCL 10 MG PO TABS: 10.0000 mg | ORAL_TABLET | Freq: Four times a day (QID) | ORAL | 11 refills | Status: AC | PRN

## 2021-07-19 NOTE — Patient Instructions (Signed)
Medication Instructions:  Your physician has recommended you make the following change in your medication: START Eliquis (Apixaban) 5 mg twice daily STOP Aspirin START Metoprolol tartrate (Lopressor) 25 mg twice daily START Propranolol 10 mg up to 4 times per day as needed for palpitations/fast heart rate (IN ADDITION to your metoprolol)  *If you need a refill on your cardiac medications before your next appointment, please call your pharmacy*   Lab Work: TODAY - TSH, CBC, BMET If you have labs (blood work) drawn today and your tests are completely normal, you will receive your results only by: MyChart Message (if you have MyChart) OR A paper copy in the mail If you have any lab test that is abnormal or we need to change your treatment, we will call you to review the results.   Testing/Procedures:  You are scheduled for a TEE Cardioversion on Tuesday Aug. 2 with Dr. Acie Fredrickson.  Please arrive at the [facility] (Main Entrance A) at [facility]: [address] [place], [place] [address] at 6:30 am. (1 hour prior to procedure unless lab work is needed; if lab work is needed arrive 1.5 hours ahead)  DIET: Nothing to eat or drink after midnight except a sip of water with medications (see medication instructions below)  FYI: For your safety, and to allow [place] to monitor your vital signs accurately during the surgery/procedure we request that   if you have artificial nails, gel coating, SNS etc. Please have those removed prior to your surgery/procedure. Not having the nail coverings /polish removed may result in cancellation or delay of your surgery/procedure.   Medication Instructions: Hold HCTZ (Hydrochlorothiazide) and Tamsulosin  Continue your anticoagulant: Eliquis You will need to continue your anticoagulant after your procedure until you are told by your Provider that it is safe to stop   You must have a responsible person to drive you home and stay in the waiting area during  your procedure. Failure to do so could result in cancellation.  Bring your insurance cards.  *Special Note: Every effort is made to have your procedure done on time. Occasionally there are emergencies that occur at the hospital that may cause delays. Please be patient if a delay does occur.    Follow-Up: At [facility], you and your health needs are our priority.  As part of our continuing mission to provide you with exceptional heart care, we have created designated Provider Care Teams.  These Care Teams include your primary Cardiologist (physician) and Advanced Practice Providers (APPs -  Physician Assistants and Nurse Practitioners) who all work together to provide you with the care you need, when you need it.   Your next appointment:   3 month(s) on Thursday October 6 at 9:20 with Dr. Acie Fredrickson  The format for your next appointment:   In Person  Provider:   You may see Mertie Moores, MD or one of the following Advanced Practice Providers on your designated Care Team:   Richardson Dopp, PA-C [place], [place]

## 2021-07-19 NOTE — Telephone Encounter (Signed)
Patient was given Eliquis 30 day supply for new start along with information regarding patient assistance and coupon.

## 2021-07-21 ENCOUNTER — Other Ambulatory Visit: Payer: Self-pay | Admitting: Cardiovascular Disease

## 2021-07-21 DIAGNOSIS — I483 Typical atrial flutter: Secondary | ICD-10-CM

## 2021-07-21 NOTE — Progress Notes (Signed)
TEE /Cardioversion orders placed

## 2021-07-27 ENCOUNTER — Telehealth: Payer: Self-pay

## 2021-07-27 NOTE — Telephone Encounter (Signed)
**Note De-Identified Marili Vader Obfuscation** I started a Propranolol PA through covermymeds and received the following response: Leven Lucena KeyCaren Griffins - Rx #: [id number] Outcome: The patient currently has access to the requested medication and a Prior Authorization is not needed for the patient/medication. Drug Propranolol HCl '10MG'$  tablets Form [employer] Medicare Electronic PA Form (2017 NCPDP)  I have notified [place] of this outcome.

## 2021-08-01 ENCOUNTER — Ambulatory Visit (HOSPITAL_COMMUNITY): Payer: Medicare HMO | Admitting: Certified Registered Nurse Anesthetist

## 2021-08-01 ENCOUNTER — Encounter (HOSPITAL_COMMUNITY)
Admission: RE | Disposition: A | Discharge status: Home / Self Care | Payer: Self-pay | Source: Home / Self Care | Attending: Cardiovascular Disease

## 2021-08-01 ENCOUNTER — Other Ambulatory Visit: Payer: Self-pay

## 2021-08-01 ENCOUNTER — Ambulatory Visit (HOSPITAL_COMMUNITY)
Admission: RE | Admit: 2021-08-01 | Discharge: 2021-08-01 | Disposition: A | Discharge status: Home / Self Care | Payer: Medicare HMO | Attending: Cardiovascular Disease | Admitting: Cardiovascular Disease

## 2021-08-01 ENCOUNTER — Ambulatory Visit (HOSPITAL_BASED_OUTPATIENT_CLINIC_OR_DEPARTMENT_OTHER): Payer: Medicare HMO

## 2021-08-01 ENCOUNTER — Encounter (HOSPITAL_COMMUNITY): Payer: Self-pay | Admitting: Cardiovascular Disease

## 2021-08-01 ENCOUNTER — Telehealth: Payer: Self-pay

## 2021-08-01 DIAGNOSIS — Z79899 Other long term (current) drug therapy: Secondary | ICD-10-CM | POA: Diagnosis not present

## 2021-08-01 DIAGNOSIS — I340 Nonrheumatic mitral (valve) insufficiency: Secondary | ICD-10-CM

## 2021-08-01 DIAGNOSIS — I483 Typical atrial flutter: Secondary | ICD-10-CM

## 2021-08-01 DIAGNOSIS — Z808 Family history of malignant neoplasm of other organs or systems: Secondary | ICD-10-CM | POA: Insufficient documentation

## 2021-08-01 DIAGNOSIS — Z905 Acquired absence of kidney: Secondary | ICD-10-CM | POA: Diagnosis not present

## 2021-08-01 DIAGNOSIS — Z8049 Family history of malignant neoplasm of other genital organs: Secondary | ICD-10-CM | POA: Diagnosis not present

## 2021-08-01 DIAGNOSIS — I491 Atrial premature depolarization: Secondary | ICD-10-CM | POA: Insufficient documentation

## 2021-08-01 DIAGNOSIS — I4510 Unspecified right bundle-branch block: Secondary | ICD-10-CM | POA: Diagnosis not present

## 2021-08-01 DIAGNOSIS — I4891 Unspecified atrial fibrillation: Secondary | ICD-10-CM | POA: Diagnosis not present

## 2021-08-01 DIAGNOSIS — Z9049 Acquired absence of other specified parts of digestive tract: Secondary | ICD-10-CM | POA: Diagnosis not present

## 2021-08-01 DIAGNOSIS — I10 Essential (primary) hypertension: Secondary | ICD-10-CM | POA: Diagnosis not present

## 2021-08-01 DIAGNOSIS — Z8572 Personal history of non-Hodgkin lymphomas: Secondary | ICD-10-CM | POA: Insufficient documentation

## 2021-08-01 DIAGNOSIS — I4892 Unspecified atrial flutter: Secondary | ICD-10-CM | POA: Insufficient documentation

## 2021-08-01 DIAGNOSIS — Q211 Atrial septal defect: Secondary | ICD-10-CM | POA: Insufficient documentation

## 2021-08-01 DIAGNOSIS — I519 Heart disease, unspecified: Secondary | ICD-10-CM

## 2021-08-01 DIAGNOSIS — Z96652 Presence of left artificial knee joint: Secondary | ICD-10-CM | POA: Diagnosis not present

## 2021-08-01 DIAGNOSIS — Z888 Allergy status to other drugs, medicaments and biological substances status: Secondary | ICD-10-CM | POA: Insufficient documentation

## 2021-08-01 DIAGNOSIS — Z8249 Family history of ischemic heart disease and other diseases of the circulatory system: Secondary | ICD-10-CM | POA: Insufficient documentation

## 2021-08-01 DIAGNOSIS — Z85528 Personal history of other malignant neoplasm of kidney: Secondary | ICD-10-CM | POA: Insufficient documentation

## 2021-08-01 DIAGNOSIS — I081 Rheumatic disorders of both mitral and tricuspid valves: Secondary | ICD-10-CM | POA: Diagnosis not present

## 2021-08-01 DIAGNOSIS — I493 Ventricular premature depolarization: Secondary | ICD-10-CM | POA: Diagnosis not present

## 2021-08-01 HISTORY — PX: BUBBLE STUDY: SHX6837

## 2021-08-01 HISTORY — PX: CARDIOVERSION: SHX1299

## 2021-08-01 HISTORY — PX: TEE WITHOUT CARDIOVERSION: SHX5443

## 2021-08-01 SURGERY — ECHOCARDIOGRAM, TRANSESOPHAGEAL
Anesthesia: Monitor Anesthesia Care

## 2021-08-01 MED ORDER — PROPOFOL 10 MG/ML IV BOLUS
INTRAVENOUS | Status: DC | PRN
  Administered 2021-08-01: 20 mg via INTRAVENOUS

## 2021-08-01 MED ORDER — LISINOPRIL 20 MG PO TABS: 20.0000 mg | ORAL_TABLET | Freq: Every day | ORAL | 11 refills | Status: DC

## 2021-08-01 MED ORDER — LIDOCAINE 2% (20 MG/ML) 5 ML SYRINGE
INTRAMUSCULAR | Status: DC | PRN
  Administered 2021-08-01: 100 mg via INTRAVENOUS

## 2021-08-01 MED ORDER — SODIUM CHLORIDE 0.9 % IV SOLN: INTRAVENOUS | Status: DC

## 2021-08-01 MED ORDER — PROPOFOL 500 MG/50ML IV EMUL
INTRAVENOUS | Status: DC | PRN
  Administered 2021-08-01: 125 ug/kg/min via INTRAVENOUS

## 2021-08-01 NOTE — Telephone Encounter (Signed)
Order placed for echo to be completed in September.  Pt has appointment scheduled for October 05, 2021 with Dr. Acie Fredrickson.       Nahser, Wonda Cheng, MD sent to [facility] Brian Parker had a successful TEE/ cardioversion today  Was found to have a PFO  Also has mild reduction of LV function , which may be rate related.  Please schedule him for an echo in 1 month  With office visit with me 1-2 weeks following the echo   Thanks   Phil

## 2021-08-01 NOTE — Interval H&P Note (Signed)
History and Physical Interval Note:  08/01/2021 7:57 AM  Brian Parker.  has presented today for surgery, with the diagnosis of A-FLUTTER.  The various methods of treatment have been discussed with the patient and family. After consideration of risks, benefits and other options for treatment, the patient has consented to  Procedure(s): TRANSESOPHAGEAL ECHOCARDIOGRAM (TEE) (N/A) CARDIOVERSION (N/A) as a surgical intervention.  The patient's history has been reviewed, patient examined, no change in status, stable for surgery.  I have reviewed the patient's chart and labs.  Questions were answered to the patient's satisfaction.     Mertie Moores

## 2021-08-01 NOTE — Anesthesia Preprocedure Evaluation (Signed)
Anesthesia Evaluation  Patient identified by MRN, date of birth, ID band Patient awake    Reviewed: Allergy & Precautions, NPO status , Patient's Chart, lab work & pertinent test results  History of Anesthesia Complications Negative for: history of anesthetic complications  Airway Mallampati: II  TM Distance: >3 FB Neck ROM: Full    Dental  (+) Dental Advisory Given, Teeth Intact,    Pulmonary neg pulmonary ROS, neg shortness of breath, neg COPD, neg recent URI,  Covid-19 Nucleic Acid Test Results No results found for: SARSCOV2NAA, SARSCOV2    breath sounds clear to auscultation       Cardiovascular hypertension,  Rhythm:Regular     Neuro/Psych negative neurological ROS  negative psych ROS   GI/Hepatic negative GI ROS, Neg liver ROS,   Endo/Other  negative endocrine ROS  Renal/GU Renal diseaseS/p right nephrectomy  Lab Results      Component                Value               Date                      CREATININE               1.12                07/19/2021           Lab Results      Component                Value               Date                      K                        4.2                 07/19/2021                Musculoskeletal negative musculoskeletal ROS (+)   Abdominal   Peds  Hematology Lab Results      Component                Value               Date                      WBC                      9.6                 07/19/2021                HGB                      17.0                07/19/2021                HCT                      50.0                07/19/2021  MCV                      90                  07/19/2021                PLT                      193                 07/19/2021            eliquis   Anesthesia Other Findings   Reproductive/Obstetrics                             Anesthesia Physical Anesthesia Plan  ASA: 2  Anesthesia  Plan: MAC and General   Post-op Pain Management:    Induction: Intravenous  PONV Risk Score and Plan: 2 and Propofol infusion and Treatment may vary due to age or medical condition  Airway Management Planned: Nasal Cannula  Additional Equipment: None  Intra-op Plan:   Post-operative Plan:   Informed Consent: I have reviewed the patients History and Physical, chart, labs and discussed the procedure including the risks, benefits and alternatives for the proposed anesthesia with the patient or authorized representative who has indicated his/her understanding and acceptance.     Dental advisory given  Plan Discussed with: CRNA and Anesthesiologist  Anesthesia Plan Comments:         Anesthesia Quick Evaluation

## 2021-08-01 NOTE — Transfer of Care (Signed)
Immediate Anesthesia Transfer of Care Note  Patient: Brian Parker.  Procedure(s) Performed: TRANSESOPHAGEAL ECHOCARDIOGRAM (TEE) CARDIOVERSION BUBBLE STUDY  Patient Location: Endoscopy Unit  Anesthesia Type:General  Level of Consciousness: awake, alert  and oriented  Airway & Oxygen Therapy: Patient Spontanous Breathing and Patient connected to nasal cannula oxygen  Post-op Assessment: Report given to RN and Post -op Vital signs reviewed and stable  Post vital signs: Reviewed and stable  Last Vitals:  Vitals Value Taken Time  BP 99/68 08/01/21 0826  Temp    Pulse 55 08/01/21 0828  Resp 11 08/01/21 0828  SpO2 98 % 08/01/21 0828  Vitals shown include unvalidated device data.  Last Pain:  Vitals:   08/01/21 0825  PainSc: 0-No pain         Complications: No notable events documented.

## 2021-08-01 NOTE — Progress Notes (Signed)
Echocardiogram Echocardiogram Transesophageal has been performed.  Oneal Deputy Trig Mcbryar 08/01/2021, 8:51 AM

## 2021-08-01 NOTE — Telephone Encounter (Signed)
Per Dr. Calvert Cantor to leave October appt as scheduled.

## 2021-08-01 NOTE — Anesthesia Postprocedure Evaluation (Signed)
Anesthesia Post Note  Patient: Brian Parker.  Procedure(s) Performed: TRANSESOPHAGEAL ECHOCARDIOGRAM (TEE) CARDIOVERSION BUBBLE STUDY     Patient location during evaluation: Endoscopy Anesthesia Type: General Level of consciousness: awake and alert Pain management: pain level controlled Vital Signs Assessment: post-procedure vital signs reviewed and stable Respiratory status: spontaneous breathing, nonlabored ventilation, respiratory function stable and patient connected to nasal cannula oxygen Cardiovascular status: blood pressure returned to baseline and stable Postop Assessment: no apparent nausea or vomiting Anesthetic complications: no   No notable events documented.  Last Vitals:  Vitals:   08/01/21 0835 08/01/21 0845  BP: 104/76 119/78  Resp:    Temp:      Last Pain:  Vitals:   08/01/21 0845  TempSrc:   PainSc: 0-No pain                 Hoa Deriso

## 2021-08-01 NOTE — CV Procedure (Signed)
    Transesophageal Echocardiogram Note  Kaisei Kerchner [id number] 1950/12/22  Procedure: Transesophageal Echocardiogram Indications: atrial flutter    Procedure Details Consent: Obtained Time Out: Verified patient identification, verified procedure, site/side was marked, verified correct patient position, special equipment/implants available, Radiology Safety Procedures followed,  medications/allergies/relevent history reviewed, required imaging and test results available.  Performed  Medications:  During this procedure the patient is administered a propofol drip by CRNA , Aldona Bar.   Total propofol 259 mg for  TEE an dCV.  Lidocaine 100 mg iv given   The patient's heart rate, blood pressure, and oxygen saturation are monitored continuously during the procedure. The period of conscious sedation is 30  minutes, of which I was present face-to-face 100% of this time.  Left Ventrical:  mild LV dysfunction , EF 40-50%  Mitral Valve: mild MR   Aortic Valve: normal 3 leaflet valve   Tricuspid Valve: mild TR   Pulmonic Valve: no PI   Left Atrium/ Left atrial appendage: no thrombi   Atrial septum: + PFO by color doppler and bubble study.    The cardiac sinus is seen vs. An anomalous venous return   Aorta: mild plaque    Complications: No apparent complications Patient did tolerate procedure well.     Cardioversion Note  Jaisean Kusnierz [id number] June 23, 1950  Procedure: DC Cardioversion Indications: atrial flutter   Procedure Details Consent: Obtained Time Out: Verified patient identification, verified procedure, site/side was marked, verified correct patient position, special equipment/implants available, Radiology Safety Procedures followed,  medications/allergies/relevent history reviewed, required imaging and test results available.  Performed  The patient has been on adequate anticoagulation.  The patient received IV Lidocain and propofol drip ( see above )  for  sedation.  Synchronous cardioversion was performed at 120  joules.  The cardioversion was successful     Complications: No apparent complications Patient did tolerate procedure well.   Thayer Headings, Brooke Bonito., MD, [facility] 08/01/2021, 8:29 AM

## 2021-08-02 ENCOUNTER — Encounter (HOSPITAL_COMMUNITY): Payer: Self-pay | Admitting: Cardiovascular Disease

## 2021-08-25 ENCOUNTER — Other Ambulatory Visit: Payer: Self-pay | Admitting: *Deleted

## 2021-08-25 MED ORDER — LISINOPRIL 20 MG PO TABS: 20.0000 mg | ORAL_TABLET | Freq: Every day | ORAL | 3 refills | Status: DC

## 2021-08-30 ENCOUNTER — Other Ambulatory Visit: Payer: Self-pay | Admitting: Cardiovascular Disease

## 2021-09-01 ENCOUNTER — Other Ambulatory Visit: Payer: Self-pay

## 2021-09-01 ENCOUNTER — Ambulatory Visit (HOSPITAL_COMMUNITY): Payer: Medicare HMO | Attending: Cardiology

## 2021-09-01 DIAGNOSIS — Z85528 Personal history of other malignant neoplasm of kidney: Secondary | ICD-10-CM | POA: Diagnosis not present

## 2021-09-01 DIAGNOSIS — I519 Heart disease, unspecified: Secondary | ICD-10-CM | POA: Diagnosis not present

## 2021-09-01 DIAGNOSIS — I340 Nonrheumatic mitral (valve) insufficiency: Secondary | ICD-10-CM

## 2021-09-01 DIAGNOSIS — Z8572 Personal history of non-Hodgkin lymphomas: Secondary | ICD-10-CM | POA: Diagnosis not present

## 2021-09-01 DIAGNOSIS — E669 Obesity, unspecified: Secondary | ICD-10-CM | POA: Diagnosis not present

## 2021-09-01 DIAGNOSIS — R600 Localized edema: Secondary | ICD-10-CM | POA: Diagnosis not present

## 2021-09-01 DIAGNOSIS — I119 Hypertensive heart disease without heart failure: Secondary | ICD-10-CM | POA: Insufficient documentation

## 2021-09-01 LAB — ECHOCARDIOGRAM COMPLETE
Area-P 1/2: 2.36 cm2
S' Lateral: 3.6 cm

## 2021-09-21 DIAGNOSIS — N401 Enlarged prostate with lower urinary tract symptoms: Secondary | ICD-10-CM | POA: Diagnosis not present

## 2021-09-21 DIAGNOSIS — R351 Nocturia: Secondary | ICD-10-CM | POA: Diagnosis not present

## 2021-09-21 DIAGNOSIS — R972 Elevated prostate specific antigen [PSA]: Secondary | ICD-10-CM | POA: Diagnosis not present

## 2021-10-05 ENCOUNTER — Ambulatory Visit: Payer: Medicare HMO | Admitting: Cardiovascular Disease

## 2021-10-05 ENCOUNTER — Encounter: Payer: Self-pay | Admitting: Cardiovascular Disease

## 2021-10-05 ENCOUNTER — Other Ambulatory Visit: Payer: Self-pay

## 2021-10-05 VITALS — BP 128/86 | HR 56 | Ht 70.0 in | Wt 229.6 lb

## 2021-10-05 DIAGNOSIS — I480 Paroxysmal atrial fibrillation: Secondary | ICD-10-CM | POA: Insufficient documentation

## 2021-10-05 DIAGNOSIS — I10 Essential (primary) hypertension: Secondary | ICD-10-CM | POA: Diagnosis not present

## 2021-10-05 NOTE — Progress Notes (Signed)
Brian Parker. Date of Birth  February 12, 1950 [facility]        [address]    [place]     [place], [place]  [address]      [phone]  Fax  [phone]     1. Hypertension 2. Non Hodgkins Lymphoma 3.  PVCs  4.  Atrial flutter :  Brian Parker has done well from a cardiac standpoint.  He recently had XRT for a growing lymph node in his abdomen.   His BP has been well controlled at home.    January 29, 2013: He continues to have issues with his non-hodgkins lymphoma.  He had XTR and has been declared in remission .  He had a  Left leg puncture would from a drill bit - never really healed well.  He recently had this would debrided and is scheduled to have a left knee replacement once this has healed.  He has not been able to exercise because of these leg issues.   Nov. 13, 2014: Brian Parker is doing well. His BP has been well controlled.  His lymphoma is still in remission.  Playing golf regularly.   May 19, 2014: Brian Parker is doing well.  Playing some golf.   BP has been well controlled.    He has been playing golf in the evenings ( walks 9 holes).   He is slowing losing wome weight.    June 27, 2015:   Brian Parker is doing well.  No CP .  Playing golf regularly .  Has been tracking what he eats .  Has been measuring his BP  several times a day   Sept. 1, 2017:  Doing well. Just got back from [place] playing golf.   Played with Brian Parker.  Is active.  Also went to [place] and [place].   Walking regularly .  Lymphoma appears to be under good control   Feb. 27, 2018 Doing well Has felt some palpitations Occurs several times ( 15-20 times this AM already )  Not related to activity Seems to be worse when he is sitting  Not associated with CP or dyspnea or pre-syncope.  Went to the ER , was found to have PVCs  Potassium was low. Sleeping well, more caffeine recently  Walks regularly  - walked 18 holes of golf on Sunday , no issues with walking   03/31/18  Feeling  well. BP is a bit higher today . Perhaps ate more salt than he should  No CP or dyspnea.  Has occasional Palpitations .  Has propranolol - does not take it  Snores,   Asked about OSA   September 22, 2018: Brian Parker is doing well.  BP is well controlled  has occasional PVCs  Playing golf regularly .   Walks most of the time   October 08, 2019: Brian Parker is seen today for follow-up visit.  He has a history of palpitations and has had PVCs diagnosed by EKG.  He has propanolol to take on an as-needed basis ( he has never taken)  He plays golf on a regular basis. No CP , no dyspnea   Nov. 22, 2021: Brian Parker is seen today for follow up of his palpitations /PVCs, HTN. Golfing is going well  No CP ,    July 19, 2021: Brian Parker is seen today for follow up of his PVCs, HTN. Hx  of lymphoma He has been having more palpitations recently   Has had a  recurrent chest cold  On multiple coarses of Abx  Saw Dr. Jacelyn Grip  Thought his HR was elevated.   Would not slow down  Tried several propranolol 10 mg - seemed to help  Constant for the past 3 days   CHADS2VASC is 2   ( age, HTN, )  October 05, 2021: Seems to be staying in sinus rhythm BP is well controlled.  Still playing golf  We discussed getting a Kardia mobile device  He and his wife try to walk every day .     Current Outpatient Medications on File Prior to Visit  Medication Sig Dispense Refill   amLODipine (NORVASC) 5 MG tablet Take 1 tablet (5 mg total) by mouth daily. 90 tablet 3   apixaban (ELIQUIS) 5 MG TABS tablet Take 1 tablet (5 mg total) by mouth 2 (two) times daily. 60 tablet 11   doxazosin (CARDURA) 2 MG tablet Take 1 tablet (2 mg total) by mouth at bedtime. 90 tablet 2   Fish Oil-Krill Oil CAPS Take 1 capsule by mouth daily.     hydrochlorothiazide (HYDRODIURIL) 25 MG tablet Take 1 tablet by mouth once daily 90 tablet 3   metoprolol tartrate (LOPRESSOR) 25 MG tablet Take 1 tablet (25 mg total) by mouth 2 (two) times daily. 60 tablet  11   Multiple Vitamin (MULTIVITAMIN) tablet Take 1 tablet by mouth daily.       propranolol (INDERAL) 10 MG tablet Take 1 tablet (10 mg total) by mouth 4 (four) times daily as needed. 60 tablet 11   tamsulosin (FLOMAX) 0.4 MG CAPS capsule Take 1 capsule by mouth daily.     vitamin C (ASCORBIC ACID) 500 MG tablet Take 500 mg by mouth daily.     lisinopril (ZESTRIL) 20 MG tablet Take 1 tablet (20 mg total) by mouth daily. (Patient not taking: Reported on 10/05/2021) 90 tablet 3   No current facility-administered medications on file prior to visit.  Amlodipine 5 mg a day   Allergies  Allergen Reactions   Losartan Other (See Comments)    Stomach cramps.    Past Medical History:  Diagnosis Date   Cancer of kidney, secondary ([place]) 01/06/2010   right kidney//right kidney removed//left kidney functions WDL   Chest pain    INTERMITTENT/15 years ago//stress induced   Edema of lower extremity    Elevated prostate specific antigen (PSA) 41/66/0630   Follicular lymphoma grade I of intra-abdominal lymph nodes ([place]) 12/31/2006   History of B-cell lymphoma 12/05/2015   Hypertension    Kidney carcinoma ([place]) 03/01/2003   tumor seperate from lymphoma; found during routine screening for lymphoma   NHL (non-Hodgkin's lymphoma) ([place]) 12/06/2014   Nodular lymphoma involving intra-abdominal lymph nodes ([place]) 12/20/2011   Non Hodgkin's lymphoma ([place])     Past Surgical History:  Procedure Laterality Date   APPENDECTOMY     BUBBLE STUDY  08/01/2021   Procedure: BUBBLE STUDY;  Surgeon: Thayer Headings, MD;  Location: [facility];  Service: Cardiovascular;;   CARDIOVASCULAR STRESS TEST  10/19/2008   EF 52%, NO ISCHEMIA   CARDIOVERSION N/A 08/01/2021   Procedure: CARDIOVERSION;  Surgeon: Thayer Headings, MD;  Location: [facility];  Service: Cardiovascular;  Laterality: N/A;   [place]  2006-2007   right laparoscopic   HERNIA REPAIR  2006-2007   left laparoscopic    NEPHRECTOMY     right kidney   SHOULDER SURGERY     TEE WITHOUT CARDIOVERSION  N/A 08/01/2021   Procedure: TRANSESOPHAGEAL ECHOCARDIOGRAM (TEE);  Surgeon: Acie Fredrickson Wonda Cheng, MD;  Location: [facility] ENDOSCOPY;  Service: Cardiovascular;  Laterality: N/A;   TOTAL KNEE ARTHROPLASTY Left 03/25/2013   Dr Marlou Sa   TOTAL KNEE ARTHROPLASTY Left 03/24/2013   Procedure: LEFT TOTAL KNEE ARTHROPLASTY;  Surgeon: Meredith Pel, MD;  Location: [facility];  Service: Orthopedics;  Laterality: Left;  Left Total Knee Arthroplasty   US ECHOCARDIOGRAPHY  02/26/2006   EF 55-60%   VENTRAL HERNIA REPAIR  2003-2004   with mesh after biopsy    Social History   Tobacco Use  Smoking Status Never  Smokeless Tobacco Never    Social History   Substance and Sexual Activity  Alcohol Use Yes   Alcohol/week: 7.0 standard drinks   Types: 6 Glasses of wine, 1 Cans of beer per week   Comment: social    Family History  Problem Relation Age of Onset   Uterine cancer Mother    Cervical cancer Mother    Coronary artery disease Father    Hypertension Father     Reviw of Systems:  Reviewed in the HPI.  All other systems are negative.  Physical Exam: Blood pressure 128/86, pulse (!) 56, height 5\' 10"  (1.778 m), weight 229 lb 9.6 oz (104.1 kg), SpO2 99 %.  GEN:  Well nourished, mildly obese,  well developed in no acute distress HEENT: Normal NECK: No JVD; No carotid bruits LYMPHATICS: No lymphadenopathy CARDIAC: RRR , no murmurs, rubs, gallops RESPIRATORY:  Clear to auscultation without rales, wheezing or rhonchi  ABDOMEN: Soft, non-tender, non-distended MUSCULOSKELETAL:  No edema; No deformity  SKIN: Warm and dry NEUROLOGIC:  Alert and oriented x 3     ECG:        Assessment / Plan:   1.  New onset atrial flutter with RVR:   Seems to be in normal sinus rhythm. We discussed Jodelle Red mobile  He will get one Cont eliquis    2. Premature ventricular contractions:     Stable    2. Non Hodgkins Lymphoma-    stable      Mertie Moores, MD  10/05/2021 9:27 AM    [facility] [place],  [place] [place], [place]  [id number] Pager [phone] Phone: [phone]; Fax: [phone]

## 2021-10-05 NOTE — Patient Instructions (Signed)

## 2021-10-11 NOTE — Telephone Encounter (Signed)
Spoke with pt and did have some beer  on Sunday after playing golf and had a glass of wine last night Per pt will continue monitoring is getting  Cardia today or tomorrow and will cut out alcohol and pt understands may take more Propanolol Pt aware if S/S worsen then needs to go to ED Will let Dr Acie Fredrickson know to be on lookout next week for Cardia readings ./cy

## 2022-01-29 DIAGNOSIS — D2262 Melanocytic nevi of left upper limb, including shoulder: Secondary | ICD-10-CM | POA: Diagnosis not present

## 2022-01-29 DIAGNOSIS — L821 Other seborrheic keratosis: Secondary | ICD-10-CM | POA: Diagnosis not present

## 2022-01-29 DIAGNOSIS — D2272 Melanocytic nevi of left lower limb, including hip: Secondary | ICD-10-CM | POA: Diagnosis not present

## 2022-01-29 DIAGNOSIS — D2261 Melanocytic nevi of right upper limb, including shoulder: Secondary | ICD-10-CM | POA: Diagnosis not present

## 2022-01-29 DIAGNOSIS — D225 Melanocytic nevi of trunk: Secondary | ICD-10-CM | POA: Diagnosis not present

## 2022-01-29 DIAGNOSIS — L814 Other melanin hyperpigmentation: Secondary | ICD-10-CM | POA: Diagnosis not present

## 2022-01-29 DIAGNOSIS — D1801 Hemangioma of skin and subcutaneous tissue: Secondary | ICD-10-CM | POA: Diagnosis not present

## 2022-01-29 DIAGNOSIS — L720 Epidermal cyst: Secondary | ICD-10-CM | POA: Diagnosis not present

## 2022-01-29 DIAGNOSIS — L570 Actinic keratosis: Secondary | ICD-10-CM | POA: Diagnosis not present

## 2022-01-29 DIAGNOSIS — D2239 Melanocytic nevi of other parts of face: Secondary | ICD-10-CM | POA: Diagnosis not present

## 2022-02-28 ENCOUNTER — Ambulatory Visit: Payer: Medicare HMO | Admitting: Orthopedic Surgery

## 2022-02-28 ENCOUNTER — Ambulatory Visit (INDEPENDENT_AMBULATORY_CARE_PROVIDER_SITE_OTHER): Payer: Medicare HMO

## 2022-02-28 ENCOUNTER — Encounter: Payer: Self-pay | Admitting: Orthopedic Surgery

## 2022-02-28 ENCOUNTER — Other Ambulatory Visit: Payer: Self-pay

## 2022-02-28 DIAGNOSIS — M25511 Pain in right shoulder: Secondary | ICD-10-CM

## 2022-02-28 NOTE — Progress Notes (Signed)
? ?Office Visit Note ?  ?Patient: Brian Parker.           ?Date of Birth: 04-10-1950           ?MRN: [id number] ?Visit Date: 02/28/2022 ?Requested by: Vernie Shanks, MD ?[place],  [place] [id number] ?PCP: Vernie Shanks, MD ? ?Subjective: ?Chief Complaint  ?Patient presents with  ? Right Shoulder - Pain  ? Right Knee - Follow-up  ? ? ?HPI: Brian Parker is a 72 year old patient with sporadic right knee pain.  Doing well with left knee replacement.  Has known history of right knee arthritis.  His symptoms are not daily but occur every so often when he is playing golf or going up and down stairs.  He does describe fairly constant right shoulder pain for the last 3 months which she localizes discretely to the anterolateral aspect of the shoulder about a handbreadth below the anterolateral aspect of the acromion.  Denies any neck pain radicular symptoms or numbness and tingling.  Hurts worse with arm extension.  Denies any mechanical symptoms.  He is right-hand dominant.  Still walks and likes to play golf.  Does have an electric cart.  He likes to walk around the neighborhood with his wife as well. ?             ?ROS: All systems reviewed are negative as they relate to the chief complaint within the history of present illness.  Patient denies  fevers or chills. ? ? ?Assessment & Plan: ?Visit Diagnoses:  ?1. Right shoulder pain, unspecified chronicity   ? ? ?Plan: Impression is right shoulder pain which looks like it could be biceps tendinitis.  Fibrous cortical defect looking lesion on the lateral cortex of the proximal humerus.  Symptoms ongoing now for 3 months with no real significant relief.  Rotator cuff strength is good.  Plan MRI scan to evaluate biceps tendon as well as to evaluate this cortical lesion on the lateral aspect of the proximal humeral metaphysis area.  Follow-up after that study.  Injection in the right knee not indicated today due to sporadic nature of symptoms. ? ?Follow-Up  Instructions: Return for after MRI.  ? ?Orders:  ?Orders Placed This Encounter  ?Procedures  ? XR Shoulder Right  ? MR SHOULDER RIGHT W CONTRAST  ? Arthrogram  ? ?No orders of the defined types were placed in this encounter. ? ? ? ? Procedures: ?No procedures performed ? ? ?Clinical Data: ?No additional findings. ? ?Objective: ?Vital Signs: There were no vitals taken for this visit. ? ?Physical Exam:  ? ?Constitutional: Patient appears well-developed ?HEENT:  ?Head: Normocephalic ?Eyes:EOM are normal ?Neck: Normal range of motion ?Cardiovascular: Normal rate ?Pulmonary/chest: Effort normal ?Neurologic: Patient is alert ?Skin: Skin is warm ?Psychiatric: Patient has normal mood and affect ? ? ?Ortho Exam: Ortho exam demonstrates full active and passive range of motion of the cervical spine.  Right shoulder has excellent rotator cuff strength infraspinatus supraspinatus subscap muscle testing.  No lymphadenopathy is present.  No discrete AC joint tenderness is present.  Passive range of motion is 70/100/170.  Has equivocal O'Brien's testing.  Does have tenderness to palpation over the bicipital groove.  No masses lymphadenopathy or skin changes present in that right shoulder girdle region. ? ?Specialty Comments:  ?No specialty comments available. ? ?Imaging: ?XR Shoulder Right ? ?Result Date: 02/28/2022 ?AP axillary outlet views right shoulder reviewed.  No acute fracture.  No glenohumeral joint arthritis is present.  Moderate  AC joint arthritis is present.  Acromiohumeral distance maintained.  Small fibrous cortical defect type lesion noted on the lateral aspect of the proximal humerus region about 4 cm below the inferior humeral neck.  No cortical destruction is present.  ? ? ?PMFS History: ?Patient Active Problem List  ? Diagnosis Date Noted  ? PAF (paroxysmal atrial fibrillation) ([place]) 10/05/2021  ? Typical atrial flutter ([place])   ? Chronic cough 05/05/2021  ? Abdominal wall pain in right flank 05/05/2021  ? Lesion of  subcutaneous tissue 11/04/2020  ? Edema of lower extremity   ? Chronic venous stasis dermatitis of both lower extremities   ? Class 1 obesity 12/08/2019  ? PVC (premature ventricular contraction) 02/26/2017  ? History of B-cell lymphoma 12/05/2015  ? History of skin cancer 12/05/2015  ? History of non-Hodgkin's lymphoma 12/06/2014  ? Preventive measure 12/06/2014  ? Elevated prostate specific antigen (PSA) 11/20/2011  ? Essential hypertension 11/09/2011  ? History of kidney cancer 03/01/2003  ? ?Past Medical History:  ?Diagnosis Date  ? Cancer of kidney, secondary ([place]) 01/06/2010  ? right kidney//right kidney removed//left kidney functions WDL  ? Chest pain   ? INTERMITTENT/15 years ago//stress induced  ? Edema of lower extremity   ? Elevated prostate specific antigen (PSA) 11/20/2011  ? Follicular lymphoma grade I of intra-abdominal lymph nodes ([place]) 12/31/2006  ? History of B-cell lymphoma 12/05/2015  ? Hypertension   ? Kidney carcinoma ([place]) 03/01/2003  ? tumor seperate from lymphoma; found during routine screening for lymphoma  ? NHL (non-Hodgkin's lymphoma) ([place]) 12/06/2014  ? Nodular lymphoma involving intra-abdominal lymph nodes ([place]) 12/20/2011  ? Non Hodgkin's lymphoma ([place])   ?  ?Family History  ?Problem Relation Age of Onset  ? Uterine cancer Mother   ? Cervical cancer Mother   ? Coronary artery disease Father   ? Hypertension Father   ?  ?Past Surgical History:  ?Procedure Laterality Date  ? APPENDECTOMY    ? BUBBLE STUDY  08/01/2021  ? Procedure: BUBBLE STUDY;  Surgeon: Thayer Headings, MD;  Location: [facility];  Service: Cardiovascular;;  ? CARDIOVASCULAR STRESS TEST  10/19/2008  ? EF 52%, NO ISCHEMIA  ? CARDIOVERSION N/A 08/01/2021  ? Procedure: CARDIOVERSION;  Surgeon: Thayer Headings, MD;  Location: [facility];  Service: Cardiovascular;  Laterality: N/A;  ? GROIN MASS OPEN BIOPSY    ? HERNIA REPAIR  2006-2007  ? right laparoscopic  ? HERNIA REPAIR  2006-2007  ? left laparoscopic  ? NEPHRECTOMY    ?  right kidney  ? SHOULDER SURGERY    ? TEE WITHOUT CARDIOVERSION N/A 08/01/2021  ? Procedure: TRANSESOPHAGEAL ECHOCARDIOGRAM (TEE);  Surgeon: Acie Fredrickson Wonda Cheng, MD;  Location[facility] ENDOSCOPY;  Service: Cardiovascular;  Laterality: N/A;  ? TOTAL KNEE ARTHROPLASTY Left 03/25/2013  ? Dr Marlou Sa  ? TOTAL KNEE ARTHROPLASTY Left 03/24/2013  ? Procedure: LEFT TOTAL KNEE ARTHROPLASTY;  Surgeon: Meredith Pel, MD;  Location: [facility];  Service: Orthopedics;  Laterality: Left;  Left Total Knee Arthroplasty  ? US ECHOCARDIOGRAPHY  02/26/2006  ? EF 55-60%  ? VENTRAL HERNIA REPAIR  2003-2004  ? with mesh after biopsy  ? ?Social History  ? ?Occupational History  ? Not on file  ?Tobacco Use  ? Smoking status: Never  ? Smokeless tobacco: Never  ?Vaping Use  ? Vaping Use: Never used  ?Substance and Sexual Activity  ? Alcohol use: Yes  ?  Alcohol/week: 7.0 standard drinks  ?  Types: 6 Glasses of wine, 1 Cans  of beer per week  ?  Comment: social  ? Drug use: No  ? Sexual activity: Not on file  ? ? ? ? ? ?

## 2022-03-02 ENCOUNTER — Encounter: Payer: Self-pay | Admitting: Orthopedic Surgery

## 2022-03-02 NOTE — Telephone Encounter (Signed)
Okay for MRI scan without arthrogram or contrast.  Okay for Valium 10 mg 30 minutes before procedure.  Thanks

## 2022-03-05 NOTE — Telephone Encounter (Signed)
Order has been changed

## 2022-03-06 ENCOUNTER — Other Ambulatory Visit: Payer: Self-pay | Admitting: Surgical

## 2022-03-06 MED ORDER — DIAZEPAM 2 MG PO TABS: 2.0000 mg | ORAL_TABLET | Freq: Once | ORAL | 0 refills | Status: AC

## 2022-03-06 NOTE — Telephone Encounter (Signed)
Sent in RX for valium

## 2022-03-14 ENCOUNTER — Other Ambulatory Visit: Payer: Self-pay | Admitting: Orthopedic Surgery

## 2022-03-14 DIAGNOSIS — M25511 Pain in right shoulder: Secondary | ICD-10-CM

## 2022-03-14 DIAGNOSIS — R972 Elevated prostate specific antigen [PSA]: Secondary | ICD-10-CM | POA: Diagnosis not present

## 2022-03-21 ENCOUNTER — Other Ambulatory Visit: Payer: Medicare HMO

## 2022-03-21 ENCOUNTER — Other Ambulatory Visit: Payer: Self-pay | Admitting: Urology

## 2022-03-21 DIAGNOSIS — R3915 Urgency of urination: Secondary | ICD-10-CM | POA: Diagnosis not present

## 2022-03-21 DIAGNOSIS — R972 Elevated prostate specific antigen [PSA]: Secondary | ICD-10-CM | POA: Diagnosis not present

## 2022-03-21 DIAGNOSIS — N401 Enlarged prostate with lower urinary tract symptoms: Secondary | ICD-10-CM | POA: Diagnosis not present

## 2022-03-26 ENCOUNTER — Encounter: Payer: Self-pay | Admitting: Orthopedic Surgery

## 2022-04-10 ENCOUNTER — Ambulatory Visit
Admission: RE | Admit: 2022-04-10 | Discharge: 2022-04-10 | Disposition: A | Discharge status: Home / Self Care | Payer: Medicare HMO | Source: Ambulatory Visit | Attending: Orthopedic Surgery | Admitting: Orthopedic Surgery

## 2022-04-10 DIAGNOSIS — M25511 Pain in right shoulder: Secondary | ICD-10-CM

## 2022-04-10 DIAGNOSIS — S46011A Strain of muscle(s) and tendon(s) of the rotator cuff of right shoulder, initial encounter: Secondary | ICD-10-CM | POA: Diagnosis not present

## 2022-04-10 MED ORDER — IOPAMIDOL (ISOVUE-M 200) INJECTION 41%
15.0000 mL | Freq: Once | INTRAMUSCULAR | Status: AC
  Administered 2022-04-10: 15 mL via INTRA_ARTICULAR

## 2022-04-24 ENCOUNTER — Encounter: Payer: Self-pay | Admitting: Orthopedic Surgery

## 2022-04-30 ENCOUNTER — Encounter: Payer: Self-pay | Admitting: Orthopedic Surgery

## 2022-04-30 NOTE — Telephone Encounter (Signed)
I think if the right knee is acting up he should get another injection and aspiration.  Regarding the shoulder MRI scan it does not look like there is definitive actionable rotator cuff pathology.  There is some arthritis in the ball-and-socket joint as well as in the [facility] joint but it does not look severe.  I think I would try injections for those before any type of more significant intervention.  Please call him tomorrow sometime thanks

## 2022-05-07 ENCOUNTER — Inpatient Hospital Stay: Payer: Medicare HMO | Attending: Hematology and Oncology

## 2022-05-07 ENCOUNTER — Encounter: Payer: Self-pay | Admitting: Hematology and Oncology

## 2022-05-07 ENCOUNTER — Other Ambulatory Visit: Payer: Self-pay

## 2022-05-07 ENCOUNTER — Inpatient Hospital Stay (HOSPITAL_BASED_OUTPATIENT_CLINIC_OR_DEPARTMENT_OTHER): Payer: Medicare HMO | Admitting: Hematology and Oncology

## 2022-05-07 DIAGNOSIS — E669 Obesity, unspecified: Secondary | ICD-10-CM

## 2022-05-07 DIAGNOSIS — Z85828 Personal history of other malignant neoplasm of skin: Secondary | ICD-10-CM | POA: Diagnosis not present

## 2022-05-07 DIAGNOSIS — Z8572 Personal history of non-Hodgkin lymphomas: Secondary | ICD-10-CM | POA: Insufficient documentation

## 2022-05-07 DIAGNOSIS — Z79899 Other long term (current) drug therapy: Secondary | ICD-10-CM | POA: Insufficient documentation

## 2022-05-07 LAB — CBC WITH DIFFERENTIAL/PLATELET
Abs Immature Granulocytes: 0.01 10*3/uL (ref 0.00–0.07)
Basophils Absolute: 0 10*3/uL (ref 0.0–0.1)
Basophils Relative: 1 %
Eosinophils Absolute: 0.2 10*3/uL (ref 0.0–0.5)
Eosinophils Relative: 3 %
HCT: 47 % (ref 39.0–52.0)
Hemoglobin: 15.6 g/dL (ref 13.0–17.0)
Immature Granulocytes: 0 %
Lymphocytes Relative: 19 %
Lymphs Abs: 0.9 10*3/uL (ref 0.7–4.0)
MCH: 29.2 pg (ref 26.0–34.0)
MCHC: 33.2 g/dL (ref 30.0–36.0)
MCV: 87.9 fL (ref 80.0–100.0)
Monocytes Absolute: 0.3 10*3/uL (ref 0.1–1.0)
Monocytes Relative: 6 %
Neutro Abs: 3.6 10*3/uL (ref 1.7–7.7)
Neutrophils Relative %: 71 %
Platelets: 181 10*3/uL (ref 150–400)
RBC: 5.35 MIL/uL (ref 4.22–5.81)
RDW: 13.2 % (ref 11.5–15.5)
WBC: 5 10*3/uL (ref 4.0–10.5)
nRBC: 0 % (ref 0.0–0.2)

## 2022-05-07 LAB — COMPREHENSIVE METABOLIC PANEL
ALT: 19 U/L (ref 0–44)
AST: 22 U/L (ref 15–41)
Albumin: 4.2 g/dL (ref 3.5–5.0)
Alkaline Phosphatase: 63 U/L (ref 38–126)
Anion gap: 6 (ref 5–15)
BUN: 19 mg/dL (ref 8–23)
CO2: 27 mmol/L (ref 22–32)
Calcium: 9 mg/dL (ref 8.9–10.3)
Chloride: 105 mmol/L (ref 98–111)
Creatinine, Ser: 1.1 mg/dL (ref 0.61–1.24)
GFR, Estimated: >60 mL/min (ref >60)
Glucose, Bld: 129 mg/dL — ABNORMAL HIGH (ref 70–99)
Potassium: 3.6 mmol/L (ref 3.5–5.1)
Sodium: 138 mmol/L (ref 135–145)
Total Bilirubin: 1.1 mg/dL (ref 0.3–1.2)
Total Protein: 6.5 g/dL (ref 6.5–8.1)

## 2022-05-07 NOTE — Progress Notes (Signed)
[facility] ?OFFICE PROGRESS NOTE ? ?Patient Care Team: ?Vernie Shanks, MD as PCP - General (Family Medicine) ?Nahser, Wonda Cheng, MD as PCP - Cardiology (Cardiology) ?Arloa Koh, MD (Inactive) as Consulting Physician (Radiation Oncology) ?Kathie Rhodes, MD (Inactive) as Consulting Physician (Urology) ?Heath Lark, MD as Consulting Physician (Hematology and Oncology) ? ?ASSESSMENT & PLAN:  ?History of B-cell lymphoma ?His last CT imaging show no evidence of disease ?His symptoms are not consistent with cancer recurrence ?The patient is reassured ?I will not order routine surveillance imaging unless he has signs or symptoms of cancer recurrence ?I will see him in 1 year ? ?History of skin cancer ?- We discussed the importance of regular sunscreen and avoidance of excessive sun exposure ? ?Class 1 obesity ?We discussed association between obesity and risk of prostate cancer and other cancers ?The patient appears motivated to continue his weight loss efforts ? ?No orders of the defined types were placed in this encounter. ? ? ?All questions were answered. The patient knows to call the clinic with any problems, questions or concerns. ?The total time spent in the appointment was 25 minutes encounter with patients including review of chart and various tests results, discussions about plan of care and coordination of care plan ?  ?Heath Lark, MD ?05/07/2022 11:19 AM ? ?INTERVAL HISTORY: ?Please see below for problem oriented charting. ?he returns for treatment follow-up for history of skin cancer, lymphoma and kidney cancer ?He is doing well ?He is attempting to lose weight ?He is scheduled for MRI to evaluate elevated PSA ?No new lymphadenopathy ?He bruises easily as he is put on anticoagulation therapy ? ?REVIEW OF SYSTEMS:   ?Constitutional: Denies fevers, chills or abnormal weight loss ?Eyes: Denies blurriness of vision ?Ears, nose, mouth, throat, and face: Denies mucositis or sore throat ?Respiratory:  Denies cough, dyspnea or wheezes ?Cardiovascular: Denies palpitation, chest discomfort or lower extremity swelling ?Gastrointestinal:  Denies nausea, heartburn or change in bowel habits ?Skin: Denies abnormal skin rashes ?Lymphatics: Denies new lymphadenopathy  ?Neurological:Denies numbness, tingling or new weaknesses ?Behavioral/Psych: Mood is stable, no new changes  ?All other systems were reviewed with the patient and are negative. ? ?I have reviewed the past medical history, past surgical history, social history and family history with the patient and they are unchanged from previous note. ? ?ALLERGIES:  is allergic to losartan. ? ?MEDICATIONS:  ?Current Outpatient Medications  ?Medication Sig Dispense Refill  ? amLODipine (NORVASC) 5 MG tablet Take 1 tablet (5 mg total) by mouth daily. 90 tablet 3  ? apixaban (ELIQUIS) 5 MG TABS tablet Take 1 tablet (5 mg total) by mouth 2 (two) times daily. 60 tablet 11  ? doxazosin (CARDURA) 2 MG tablet Take 1 tablet (2 mg total) by mouth at bedtime. 90 tablet 2  ? Fish Oil-Krill Oil CAPS Take 1 capsule by mouth daily.    ? hydrochlorothiazide (HYDRODIURIL) 25 MG tablet Take 1 tablet by mouth once daily 90 tablet 3  ? metoprolol tartrate (LOPRESSOR) 25 MG tablet Take 1 tablet (25 mg total) by mouth 2 (two) times daily. 60 tablet 11  ? Multiple Vitamin (MULTIVITAMIN) tablet Take 1 tablet by mouth daily.      ? propranolol (INDERAL) 10 MG tablet Take 1 tablet (10 mg total) by mouth 4 (four) times daily as needed. 60 tablet 11  ? tamsulosin (FLOMAX) 0.4 MG CAPS capsule Take 1 capsule by mouth daily.    ? vitamin C (ASCORBIC ACID) 500 MG tablet Take 500 mg  by mouth daily.    ? ?No current facility-administered medications for this visit.  ? ? ?SUMMARY OF ONCOLOGIC HISTORY: ?Oncology History  ?History of non-Hodgkin's lymphoma  ?12/31/2001 Initial Diagnosis  ? He was initially diagnosed with a follicular grade 1,  B-cell, non-Hodgkin's lymphoma in 2003 .  ?He has also been diagnosed  with metachronous primary tumors of his right kidney and ultimately underwent initial partial nephrectomy in March 2004 then a completion nephrectomy in January 2011. ?He was treated with 8 cycles of CHOP/Rituxan through July 2003. He was started on maintenance rituximab but only had 2 cycles due to development of delayed neutropenia.  ?There was suspicion for early progression on a CT scan done in July of 2009 which showed a single area of soft tissue density in the left periaortic region. This area remained stable on serial scans until a study done on 11/13/2011 which did show clear regrowth in this area enlarging from 2.6 x 0.9 cm to approximately 4.9 x 3.4 cm. A PET scan was done on 11/30/2011 which showed a single area of markedly abnormal activity with SUV up to 10.3 over the abnormal area of lymphadenopathy seen on CT scan. There were no other areas of activity.  ?He received 3000 cGy in 15 fractions to the left para-aortic lymph node mass between January 16 and 02/04/2012.  ?  ?12/10/2017 Imaging  ? 1. Stable CT of the abdomen and pelvis. No evidence for recurrent lymphoma. ?2. Status post right nephrectomy. No evidence for local tumor recurrence or metastatic disease. ?3. Status post repair of ventral abdominal wall hernia. ? ?  ?05/11/2020 Imaging  ? 1. No findings of recurrent lymphoma. ?2. Other imaging findings of potential clinical significance: ?Sigmoid colon diverticulosis. Stable prostatomegaly. ?3. Aortic atherosclerosis. ?  ?Aortic Atherosclerosis (ICD10-I70.0). ?  ?  ? ? ?PHYSICAL EXAMINATION: ?ECOG PERFORMANCE STATUS: 1 - Symptomatic but completely ambulatory ? ?Vitals:  ? 05/07/22 1046  ?BP: (!) 134/96  ?Pulse: 73  ?Resp: 18  ?Temp: 98.1 ?F (36.7 ?C)  ?SpO2: 98%  ? ?Filed Weights  ? 05/07/22 1046  ?Weight: 225 lb 6.4 oz (102.2 kg)  ? ? ?GENERAL:alert, no distress and comfortable ?SKIN: skin color, texture, turgor are normal, no rashes or significant lesions.  Noted skin bruising ?EYES: normal,  Conjunctiva are pink and non-injected, sclera clear ?OROPHARYNX:no exudate, no erythema and lips, buccal mucosa, and tongue normal  ?NECK: supple, thyroid normal size, non-tender, without nodularity ?LYMPH:  no palpable lymphadenopathy in the cervical, axillary or inguinal ?LUNGS: clear to auscultation and percussion with normal breathing effort ?HEART: regular rate & rhythm and no murmurs and no lower extremity edema ?ABDOMEN:abdomen soft, non-tender and normal bowel sounds ?Musculoskeletal:no cyanosis of digits and no clubbing  ?NEURO: alert & oriented x 3 with fluent speech, no focal motor/sensory deficits ? ?LABORATORY DATA:  ?I have reviewed the data as listed ?   ?Component Value Date/Time  ? NA 138 05/07/2022 1025  ? NA 140 07/19/2021 1034  ? NA 141 12/05/2017 1013  ? K 3.6 05/07/2022 1025  ? K 3.9 12/05/2017 1013  ? CL 105 05/07/2022 1025  ? CL 105 03/02/2013 0818  ? CO2 27 05/07/2022 1025  ? CO2 27 12/05/2017 1013  ? GLUCOSE 129 (H) 05/07/2022 1025  ? GLUCOSE 84 12/05/2017 1013  ? GLUCOSE 88 03/02/2013 0818  ? BUN 19 05/07/2022 1025  ? BUN 17 07/19/2021 1034  ? BUN 16.9 12/05/2017 1013  ? CREATININE 1.10 05/07/2022 1025  ? CREATININE 1.1 12/05/2017  1013  ? CALCIUM 9.0 05/07/2022 1025  ? CALCIUM 9.3 12/05/2017 1013  ? PROT 6.5 05/07/2022 1025  ? PROT 7.0 12/05/2017 1013  ? ALBUMIN 4.2 05/07/2022 1025  ? ALBUMIN 4.4 12/05/2017 1013  ? AST 22 05/07/2022 1025  ? AST 20 12/05/2017 1013  ? ALT 19 05/07/2022 1025  ? ALT 18 12/05/2017 1013  ? ALKPHOS 63 05/07/2022 1025  ? ALKPHOS 71 12/05/2017 1013  ? BILITOT 1.1 05/07/2022 1025  ? BILITOT 0.75 12/05/2017 1013  ? GFRNONAA >60 05/07/2022 1025  ? GFRAA 100 11/21/2020 0952  ? ? ?No results found for: SPEP, UPEP ? ?Lab Results  ?Component Value Date  ? WBC 5.0 05/07/2022  ? NEUTROABS 3.6 05/07/2022  ? HGB 15.6 05/07/2022  ? HCT 47.0 05/07/2022  ? MCV 87.9 05/07/2022  ? PLT 181 05/07/2022  ? ? ?  Chemistry   ?   ?Component Value Date/Time  ? NA 138 05/07/2022 1025  ? NA  140 07/19/2021 1034  ? NA 141 12/05/2017 1013  ? K 3.6 05/07/2022 1025  ? K 3.9 12/05/2017 1013  ? CL 105 05/07/2022 1025  ? CL 105 03/02/2013 0818  ? CO2 27 05/07/2022 1025  ? CO2 27 12/05/2017 1013

## 2022-05-07 NOTE — Assessment & Plan Note (Signed)
-   We discussed the importance of regular sunscreen and avoidance of excessive sun exposure ?

## 2022-05-07 NOTE — Assessment & Plan Note (Signed)
We discussed association between obesity and risk of prostate cancer and other cancers ?The patient appears motivated to continue his weight loss efforts ?

## 2022-05-07 NOTE — Assessment & Plan Note (Signed)
His last CT imaging show no evidence of disease ?His symptoms are not consistent with cancer recurrence ?The patient is reassured ?I will not order routine surveillance imaging unless he has signs or symptoms of cancer recurrence ?I will see him in 1 year ?

## 2022-05-09 ENCOUNTER — Other Ambulatory Visit: Payer: Medicare HMO

## 2022-05-23 ENCOUNTER — Encounter: Payer: Self-pay | Admitting: Orthopedic Surgery

## 2022-05-23 ENCOUNTER — Ambulatory Visit: Payer: Medicare HMO | Admitting: Orthopedic Surgery

## 2022-05-23 ENCOUNTER — Ambulatory Visit
Admission: RE | Admit: 2022-05-23 | Discharge: 2022-05-23 | Disposition: A | Discharge status: Home / Self Care | Payer: Medicare HMO | Source: Ambulatory Visit | Attending: Urology | Admitting: Urology

## 2022-05-23 DIAGNOSIS — N402 Nodular prostate without lower urinary tract symptoms: Secondary | ICD-10-CM | POA: Diagnosis not present

## 2022-05-23 DIAGNOSIS — R972 Elevated prostate specific antigen [PSA]: Secondary | ICD-10-CM

## 2022-05-23 DIAGNOSIS — M7551 Bursitis of right shoulder: Secondary | ICD-10-CM | POA: Diagnosis not present

## 2022-05-23 DIAGNOSIS — R590 Localized enlarged lymph nodes: Secondary | ICD-10-CM | POA: Diagnosis not present

## 2022-05-23 DIAGNOSIS — K5730 Diverticulosis of large intestine without perforation or abscess without bleeding: Secondary | ICD-10-CM | POA: Diagnosis not present

## 2022-05-23 MED ORDER — GADOBENATE DIMEGLUMINE 529 MG/ML IV SOLN
20.0000 mL | Freq: Once | INTRAVENOUS | Status: AC | PRN
  Administered 2022-05-23: 20 mL via INTRAVENOUS

## 2022-05-23 MED ORDER — METHYLPREDNISOLONE ACETATE 40 MG/ML IJ SUSP
40.0000 mg | INTRAMUSCULAR | Status: AC | PRN
Start: 2022-05-23 — End: 2022-05-23
  Administered 2022-05-23: 40 mg via INTRA_ARTICULAR

## 2022-05-23 MED ORDER — LIDOCAINE HCL 1 % IJ SOLN
5.0000 mL | INTRAMUSCULAR | Status: AC | PRN
  Administered 2022-05-23: 5 mL

## 2022-05-23 MED ORDER — BUPIVACAINE HCL 0.5 % IJ SOLN
9.0000 mL | INTRAMUSCULAR | Status: AC | PRN
Start: 2022-05-23 — End: 2022-05-23
  Administered 2022-05-23: 9 mL via INTRA_ARTICULAR

## 2022-05-23 NOTE — Progress Notes (Signed)
Office Visit Note   Patient: [facility].           Date of Birth: Dec 14, 1950           MRN: [id number] Visit Date: 05/23/2022 Requested by: Brian Shanks, MD [place],  [place] [address] PCP: Brian Shanks, MD  Subjective: Chief Complaint  Patient presents with   Right Shoulder - Follow-up    HPI: [facility] is a 72 year old patient with right shoulder pain.  He was supposed to come in for right knee pain but his knee is actually doing better.  MRI scan was done on the right shoulder and April.  That showed a small amount of focal arthritis in the glenoid along with partial-thickness tearing of the rotator cuff and possible biceps tendinitis.  He is having mostly anterior pain with extension.  Not affecting his golf game.  Could have some AC joint arthritis but does not really localize any of the symptoms there.              ROS: All systems reviewed are negative as they relate to the chief complaint within the history of present illness.  Patient denies  fevers or chills.   Assessment & Plan: Visit Diagnoses:  1. Bursitis of right shoulder     Plan: Impression is right shoulder bursitis partial-thickness rotator cuff tearing.  Plan is right shoulder divided injection into the glenohumeral joint and subacromial space.  Plan for observation for now.  No surgical indications.  No issues with any type of cancer concerns as he does have a history of cancer treated in remission.  His right knee is doing well and we will hold off on injection to the right knee at this time  Follow-Up Instructions: Return if symptoms worsen or fail to improve.   Orders:  No orders of the defined types were placed in this encounter.  No orders of the defined types were placed in this encounter.     Procedures: Large Joint Inj: R glenohumeral on 05/23/2022 9:43 AM Indications: diagnostic evaluation and pain Details: 18 G 1.5 in needle, posterior approach  Arthrogram:  No  Medications: 9 mL bupivacaine 0.5 %; 40 mg methylPREDNISolone acetate 40 MG/ML; 5 mL lidocaine 1 % Outcome: tolerated well, no immediate complications Procedure, treatment alternatives, risks and benefits explained, specific risks discussed. Consent was given by the patient. Immediately prior to procedure a time out was called to verify the correct patient, procedure, equipment, support staff and site/side marked as required. Patient was prepped and draped in the usual sterile fashion.      Clinical Data: No additional findings.  Objective: Vital Signs: There were no vitals taken for this visit.  Physical Exam:   Constitutional: Patient appears well-developed HEENT:  Head: Normocephalic Eyes:EOM are normal Neck: Normal range of motion Cardiovascular: Normal rate Pulmonary/chest: Effort normal Neurologic: Patient is alert Skin: Skin is warm Psychiatric: Patient has normal mood and affect   Ortho Exam: Ortho exam demonstrates good cervical spine range of motion.  Right shoulder also has very good range of motion of 70/100/170.  Rotator cuff strength is good infraspinatus supraspinatus subscap muscle testing.  No discrete AC joint tenderness is present.  Speeds testing negative.  O'Brien's testing equivocal.  No coarse grinding or crepitus with internal and external rotation at 90 degrees of abduction.  Specialty Comments:  No specialty comments available.  Imaging: No results found.   PMFS History: Patient Active Problem List   Diagnosis  Date Noted   PAF (paroxysmal atrial fibrillation) ([facility]) 10/05/2021   Typical atrial flutter ([facility])    Chronic cough 05/05/2021   Abdominal wall pain in right flank 05/05/2021   Lesion of subcutaneous tissue 11/04/2020   Edema of lower extremity    Chronic venous stasis dermatitis of both lower extremities    Class 1 obesity 12/08/2019   PVC (premature ventricular contraction) 02/26/2017   History of B-cell lymphoma 12/05/2015    History of skin cancer 12/05/2015   History of non-Hodgkin's lymphoma 12/06/2014   Preventive measure 12/06/2014   Elevated prostate specific antigen (PSA) 11/20/2011   Essential hypertension 11/09/2011   History of kidney cancer 03/01/2003   Past Medical History:  Diagnosis Date   Cancer of kidney, secondary ([facility]) 01/06/2010   right kidney//right kidney removed//left kidney functions WDL   Chest pain    INTERMITTENT/15 years ago//stress induced   Edema of lower extremity    Elevated prostate specific antigen (PSA) 44/81/8563   Follicular lymphoma grade I of intra-abdominal lymph nodes ([place]) 12/31/2006   History of B-cell lymphoma 12/05/2015   Hypertension    Kidney carcinoma ([facility]) 03/01/2003   tumor seperate from lymphoma; found during routine screening for lymphoma   NHL (non-Hodgkin's lymphoma) ([facility]) 12/06/2014   Nodular lymphoma involving intra-abdominal lymph nodes ([facility]) 12/20/2011   Non Hodgkin's lymphoma ([place])     Family History  Problem Relation Age of Onset   Uterine cancer Mother    Cervical cancer Mother    Coronary artery disease Father    Hypertension Father     Past Surgical History:  Procedure Laterality Date   APPENDECTOMY     BUBBLE STUDY  08/01/2021   Procedure: BUBBLE STUDY;  Surgeon: Brian Headings, MD;  [facility];  Service: Cardiovascular;;   CARDIOVASCULAR STRESS TEST  10/19/2008   EF 52%, NO ISCHEMIA   CARDIOVERSION N/A 08/01/2021   Procedure: CARDIOVERSION;  Surgeon: Brian Headings, MD;  [facility];  Service: Cardiovascular;  Laterality: N/A;   [place]  2006-2007   right laparoscopic   HERNIA REPAIR  2006-2007   left laparoscopic   NEPHRECTOMY     right kidney   SHOULDER SURGERY     TEE WITHOUT CARDIOVERSION N/A 08/01/2021   Procedure: TRANSESOPHAGEAL ECHOCARDIOGRAM (TEE);  Surgeon: Brian Fredrickson Wonda Cheng, MD;  Location: [facility] END[facility];  Service: Cardiovascular;  Laterality: N/A;   TOTAL KNEE  ARTHROPLASTY Left 03/25/2013   Dr Marlou Sa   TOTAL KNEE ARTHROPLASTY Left 03/24/2013   Procedure: LEFT TOTAL KNEE ARTHROPLASTY;  Surgeon: Meredith Pel, MD;  Location: [facility];  Service: Orthopedics;  Laterality: Left;  Left Total Knee Arthroplasty   US ECHOCARDIOGRAPHY  02/26/2006   EF 55-60%   VENTRAL HERNIA REPAIR  2003-2004   with mesh after biopsy   Social History   Occupational History   Not on file  Tobacco Use   Smoking status: Never   Smokeless tobacco: Never  Vaping Use   Vaping Use: Never used  Substance and Sexual Activity   Alcohol use: Yes    Alcohol/week: 7.0 standard drinks    Types: 6 Glasses of wine, 1 Cans of beer per week    Comment: social   Drug use: No   Sexual activity: Not on file

## 2022-05-30 ENCOUNTER — Encounter: Payer: Self-pay | Admitting: Cardiovascular Disease

## 2022-05-30 MED ORDER — AMLODIPINE BESYLATE 5 MG PO TABS: 5.0000 mg | ORAL_TABLET | Freq: Every day | ORAL | 3 refills | Status: DC

## 2022-05-30 NOTE — Telephone Encounter (Signed)
Called and got pt scheduled for 10/09/22 at 0900 for yearly visit. Will send refill in at this time per request.

## 2022-05-31 DIAGNOSIS — R972 Elevated prostate specific antigen [PSA]: Secondary | ICD-10-CM | POA: Diagnosis not present

## 2022-05-31 DIAGNOSIS — R3912 Poor urinary stream: Secondary | ICD-10-CM | POA: Diagnosis not present

## 2022-05-31 DIAGNOSIS — N401 Enlarged prostate with lower urinary tract symptoms: Secondary | ICD-10-CM | POA: Diagnosis not present

## 2022-08-07 ENCOUNTER — Other Ambulatory Visit: Payer: Self-pay | Admitting: Cardiovascular Disease

## 2022-08-13 ENCOUNTER — Encounter: Payer: Self-pay | Admitting: Orthopedic Surgery

## 2022-08-20 DIAGNOSIS — J069 Acute upper respiratory infection, unspecified: Secondary | ICD-10-CM | POA: Diagnosis not present

## 2022-08-24 ENCOUNTER — Ambulatory Visit: Payer: Self-pay

## 2022-08-24 ENCOUNTER — Ambulatory Visit: Payer: Medicare HMO | Admitting: Orthopedic Surgery

## 2022-08-24 ENCOUNTER — Encounter: Payer: Self-pay | Admitting: Orthopedic Surgery

## 2022-08-24 DIAGNOSIS — M25511 Pain in right shoulder: Secondary | ICD-10-CM

## 2022-08-24 DIAGNOSIS — M7551 Bursitis of right shoulder: Secondary | ICD-10-CM

## 2022-08-24 DIAGNOSIS — M7521 Bicipital tendinitis, right shoulder: Secondary | ICD-10-CM | POA: Diagnosis not present

## 2022-08-24 MED ORDER — BUPIVACAINE HCL 0.5 % IJ SOLN
9.0000 mL | INTRAMUSCULAR | Status: AC | PRN
  Administered 2022-08-24: 9 mL via INTRA_ARTICULAR

## 2022-08-24 MED ORDER — LIDOCAINE HCL 1 % IJ SOLN
5.0000 mL | INTRAMUSCULAR | Status: AC | PRN
  Administered 2022-08-24: 5 mL

## 2022-08-24 MED ORDER — METHYLPREDNISOLONE ACETATE 40 MG/ML IJ SUSP
40.0000 mg | INTRAMUSCULAR | Status: AC | PRN
  Administered 2022-08-24: 40 mg via INTRA_ARTICULAR

## 2022-08-24 NOTE — Progress Notes (Signed)
Office Visit Note   Patient: [facility].           Date of Birth: 11-01-1950           MRN: [id number] Visit Date: 08/24/2022 Requested by: No referring provider defined for this encounter. PCP: Vernie Shanks, MD (Inactive)  Subjective: Chief Complaint  Patient presents with   Right Shoulder - Follow-up    HPI: [facility] is a 72 year old patient with right shoulder pain.  Had divided glenohumeral and subacromial injection 05/23/2022.  Had short relief for 4 to 5 days.  Reports pain with certain movements.  Constant dull aching anteriorly from time to time particularly when he is reaching back behind him.  Denies any mechanical symptoms.  Denies any neck pain or numbness and tingling.  He is actually okay to play golf.  MRI scan shows partial-thickness cuff tearing with biceps tendinitis              ROS: All systems reviewed are negative as they relate to the chief complaint within the history of present illness.  Patient denies  fevers or chills.   Assessment & Plan: Visit Diagnoses:  1. Bursitis of right shoulder   2. Right shoulder pain, unspecified chronicity     Plan: Impression is likely anterior shoulder pain related to the biceps tendon.  Cuff strength is good.  Does not look like frozen shoulder.  Ultrasound-guided intra-articular injection performed today.  Follow-up as needed.  May need to consider diagnostic arthroscopy with possible biceps tenodesis if symptoms persist.  I do not think it has reached that level for [facility] yet.  Follow-Up Instructions: Return if symptoms worsen or fail to improve.   Orders:  Orders Placed This Encounter  Procedures   US Guided Needle Placement - No Linked Charges   No orders of the defined types were placed in this encounter.     Procedures: Large Joint Inj: R glenohumeral on 08/24/2022 4:15 PM Indications: diagnostic evaluation and pain Details: 18 G 1.5 in needle, ultrasound-guided posterior approach  Arthrogram:  No  Medications: 9 mL bupivacaine 0.5 %; 40 mg methylPREDNISolone acetate 40 MG/ML; 5 mL lidocaine 1 % Outcome: tolerated well, no immediate complications Procedure, treatment alternatives, risks and benefits explained, specific risks discussed. Consent was given by the patient. Immediately prior to procedure a time out was called to verify the correct patient, procedure, equipment, support staff and site/side marked as required. Patient was prepped and draped in the usual sterile fashion.       Clinical Data: No additional findings.  Objective: Vital Signs: There were no vitals taken for this visit.  Physical Exam:   Constitutional: Patient appears well-developed HEENT:  Head: Normocephalic Eyes:EOM are normal Neck: Normal range of motion Cardiovascular: Normal rate Pulmonary/chest: Effort normal Neurologic: Patient is alert Skin: Skin is warm Psychiatric: Patient has normal mood and affect   Ortho Exam: Ortho exam demonstrates full active and passive range of motion of the shoulder on the left with motion of 75/120/170.  On the right his motion is 75/110/170.  Rotator cuff strength is good infraspinatus supraspinatus and subscap muscle testing.  No masses lymphadenopathy or skin changes noted in that shoulder region.  No discrete AC joint tenderness is present.  O'Brien's testing equivocal on the right negative on the left.  Specialty Comments:  No specialty comments available.  Imaging: US Guided Needle Placement - No Linked Charges  Result Date: 08/24/2022 Ultrasound imaging demonstrates needle placement into the superior lateral glenohumeral  joint with extravasation of fluid and no complicating features    PMFS History: Patient Active Problem List   Diagnosis Date Noted   PAF (paroxysmal atrial fibrillation) ([place]) 10/05/2021   Typical atrial flutter ([facility])    Chronic cough 05/05/2021   Abdominal wall pain in right flank 05/05/2021   Lesion of subcutaneous tissue  11/04/2020   Edema of lower extremity    Chronic venous stasis dermatitis of both lower extremities    Class 1 obesity 12/08/2019   PVC (premature ventricular contraction) 02/26/2017   History of B-cell lymphoma 12/05/2015   History of skin cancer 12/05/2015   History of non-Hodgkin's lymphoma 12/06/2014   Preventive measure 12/06/2014   Elevated prostate specific antigen (PSA) 11/20/2011   Essential hypertension 11/09/2011   History of kidney cancer 03/01/2003   Past Medical History:  Diagnosis Date   Cancer of kidney, secondary ([place]) 01/06/2010   right kidney//right kidney removed//left kidney functions WDL   Chest pain    INTERMITTENT/15 years ago//stress induced   Edema of lower extremity    Elevated prostate specific antigen (PSA) 76/73/4193   Follicular lymphoma grade I of intra-abdominal lymph nodes ([place]) 12/31/2006   History of B-cell lymphoma 12/05/2015   Hypertension    Kidney carcinoma ([place]) 03/01/2003   tumor seperate from lymphoma; found during routine screening for lymphoma   NHL (non-Hodgkin's lymphoma) ([place]) 12/06/2014   Nodular lymphoma involving intra-abdominal lymph nodes ([place]) 12/20/2011   Non Hodgkin's lymphoma ([place])     Family History  Problem Relation Age of Onset   Uterine cancer Mother    Cervical cancer Mother    Coronary artery disease Father    Hypertension Father     Past Surgical History:  Procedure Laterality Date   APPENDECTOMY     BUBBLE STUDY  08/01/2021   Procedure: BUBBLE STUDY;  Surgeon: Thayer Headings, MD;  Location: [facility] ENDOSCOPY;  Service: Cardiovascular;;   CARDIOVASCULAR STRESS TEST  10/19/2008   EF 52%, NO ISCHEMIA   CARDIOVERSION N/A 08/01/2021   Procedure: CARDIOVERSION;  Surgeon: Thayer Headings, MD;  Location: [facility] ENDOSCOPY;  Service: Cardiovascular;  Laterality: N/A;   [place]  2006-2007   right laparoscopic   HERNIA REPAIR  2006-2007   left laparoscopic   NEPHRECTOMY     right kidney   SHOULDER  SURGERY     TEE WITHOUT CARDIOVERSION N/A 08/01/2021   Procedure: TRANSESOPHAGEAL ECHOCARDIOGRAM (TEE);  Surgeon: Acie Fredrickson Wonda Cheng, MD;  Location: [facility] ENDOSCOPY;  Service: Cardiovascular;  Laterality: N/A;   TOTAL KNEE ARTHROPLASTY Left 03/25/2013   Dr Marlou Sa   TOTAL KNEE ARTHROPLASTY Left 03/24/2013   Procedure: LEFT TOTAL KNEE ARTHROPLASTY;  Surgeon: Meredith Pel, MD;  Location: [facility];  Service: Orthopedics;  Laterality: Left;  Left Total Knee Arthroplasty   US ECHOCARDIOGRAPHY  02/26/2006   EF 55-60%   VENTRAL HERNIA REPAIR  2003-2004   with mesh after biopsy   Social History   Occupational History   Not on file  Tobacco Use   Smoking status: Never   Smokeless tobacco: Never  Vaping Use   Vaping Use: Never used  Substance and Sexual Activity   Alcohol use: Yes    Alcohol/week: 7.0 standard drinks of alcohol    Types: 6 Glasses of wine, 1 Cans of beer per week    Comment: social   Drug use: No   Sexual activity: Not on file

## 2022-08-25 ENCOUNTER — Other Ambulatory Visit: Payer: Self-pay | Admitting: Cardiovascular Disease

## 2022-08-27 NOTE — Telephone Encounter (Signed)
Prescription refill request for Eliquis received. Indication:Afib Last office visit:10/22 Scr:1.1 Age: 72 Weight:102.2 kg  Prescription refilled

## 2022-08-31 DIAGNOSIS — J208 Acute bronchitis due to other specified organisms: Secondary | ICD-10-CM | POA: Diagnosis not present

## 2022-09-11 DIAGNOSIS — J40 Bronchitis, not specified as acute or chronic: Secondary | ICD-10-CM | POA: Diagnosis not present

## 2022-09-11 DIAGNOSIS — R059 Cough, unspecified: Secondary | ICD-10-CM | POA: Diagnosis not present

## 2022-09-11 DIAGNOSIS — J019 Acute sinusitis, unspecified: Secondary | ICD-10-CM | POA: Diagnosis not present

## 2022-09-11 DIAGNOSIS — R0981 Nasal congestion: Secondary | ICD-10-CM | POA: Diagnosis not present

## 2022-10-07 ENCOUNTER — Encounter: Payer: Self-pay | Admitting: Cardiovascular Disease

## 2022-10-07 NOTE — Progress Notes (Unsigned)
Brian Parker. Date of Birth  February 12, 1950 [facility]        [address]    [place]     [place], [place]  [address]      [phone]  Fax  [phone]     1. Hypertension 2. Non Hodgkins Lymphoma 3.  PVCs  4.  Atrial flutter :  Brian Parker has done well from a cardiac standpoint.  He recently had XRT for a growing lymph node in his abdomen.   His BP has been well controlled at home.    January 29, 2013: He continues to have issues with his non-hodgkins lymphoma.  He had XTR and has been declared in remission .  He had a  Left leg puncture would from a drill bit - never really healed well.  He recently had this would debrided and is scheduled to have a left knee replacement once this has healed.  He has not been able to exercise because of these leg issues.   Nov. 13, 2014: Brian Parker is doing well. His BP has been well controlled.  His lymphoma is still in remission.  Playing golf regularly.   May 19, 2014: Brian Parker is doing well.  Playing some golf.   BP has been well controlled.    He has been playing golf in the evenings ( walks 9 holes).   He is slowing losing wome weight.    June 27, 2015:   Brian Parker is doing well.  No CP .  Playing golf regularly .  Has been tracking what he eats .  Has been measuring his BP  several times a day   Sept. 1, 2017:  Doing well. Just got back from [place] playing golf.   Played with [name].  Is active.  Also went to [place] and [place].   Walking regularly .  Lymphoma appears to be under good control   Feb. 27, 2018 Doing well Has felt some palpitations Occurs several times ( 15-20 times this AM already )  Not related to activity Seems to be worse when he is sitting  Not associated with CP or dyspnea or pre-syncope.  Went to the ER , was found to have PVCs  Potassium was low. Sleeping well, more caffeine recently  Walks regularly  - walked 18 holes of golf on Sunday , no issues with walking   03/31/18  Feeling  well. BP is a bit higher today . Perhaps ate more salt than he should  No CP or dyspnea.  Has occasional Palpitations .  Has propranolol - does not take it  Snores,   Asked about OSA   September 22, 2018: Brian Parker is doing well.  BP is well controlled  has occasional PVCs  Playing golf regularly .   Walks most of the time   October 08, 2019: Brian Parker is seen today for follow-up visit.  He has a history of palpitations and has had PVCs diagnosed by EKG.  He has propanolol to take on an as-needed basis ( he has never taken)  He plays golf on a regular basis. No CP , no dyspnea   Nov. 22, 2021: Brian Parker is seen today for follow up of his palpitations /PVCs, HTN. Golfing is going well  No CP ,    July 19, 2021: Brian Parker is seen today for follow up of his PVCs, HTN. Hx  of lymphoma He has been having more palpitations recently   Has had a  recurrent chest cold  On multiple coarses of Abx  Saw Dr. Jacelyn Grip  Thought his HR was elevated.   Would not slow down  Tried several propranolol 10 mg - seemed to help  Constant for the past 3 days   CHADS2VASC is 2   ( age, HTN, )  October 05, 2021: Seems to be staying in sinus rhythm BP is well controlled.  Still playing golf  We discussed getting a Kardia mobile device  He and his wife try to walk every day .  Oct. 9, 2023 Brian Parker is seen today for follow up of his PAF , HTN CHADS2VASC is 2 .  Is on Eliquis  He is back in atrial flutter today. He has had atrial flutter in the past.  He was scheduled for cardioversion in August, 2022.  When he presented for his cardioversion, he was in sinus rhythm and the procedure was canceled.  Has a Kardia mobile, shows atrial flutter .  occasional variable AV block  Has lost about 20 lbs  Wt today 678 lbs   ] Diastolic BP is mildly elevated  Only takes the amlodipine 5 QOD and HCTZ QOD  Does not eat salty foods  Had unusual abdominal cramping in response to losartan. Willing crease the HCTZ to 25 mg every  day.  We will add potassium chloride 20 mEq a day.  Recheck basic metabolic profile in 2 to 3 weeks.  Feels well, no CP , no dyspnea   Golf game has not been great        Current Outpatient Medications on File Prior to Visit  Medication Sig Dispense Refill   amLODipine (NORVASC) 5 MG tablet Take 1 tablet (5 mg total) by mouth daily. 90 tablet 3   apixaban (ELIQUIS) 5 MG TABS tablet Take 1 tablet by mouth twice daily 60 tablet 5   doxazosin (CARDURA) 2 MG tablet Take 1 tablet (2 mg total) by mouth at bedtime. 90 tablet 2   Fish Oil-Krill Oil CAPS Take 1 capsule by mouth daily.     hydrochlorothiazide (HYDRODIURIL) 25 MG tablet Take 1 tablet by mouth once daily 90 tablet 3   metoprolol tartrate (LOPRESSOR) 25 MG tablet Take 1 tablet by mouth twice daily 60 tablet 2   Multiple Vitamin (MULTIVITAMIN) tablet Take 1 tablet by mouth daily.       propranolol (INDERAL) 10 MG tablet Take 1 tablet (10 mg total) by mouth 4 (four) times daily as needed. 60 tablet 11   tamsulosin (FLOMAX) 0.4 MG CAPS capsule Take 1 capsule by mouth daily.     vitamin C (ASCORBIC ACID) 500 MG tablet Take 500 mg by mouth daily.     No current facility-administered medications on file prior to visit.  Amlodipine 5 mg a day   Allergies  Allergen Reactions   Losartan Other (See Comments)    Stomach cramps.    Past Medical History:  Diagnosis Date   Cancer of kidney, secondary ([place]) 01/06/2010   right kidney//right kidney removed//left kidney functions WDL   Chest pain    INTERMITTENT/15 years ago//stress induced   Edema of lower extremity    Elevated prostate specific antigen (PSA) 93/81/0175   Follicular lymphoma grade I of intra-abdominal lymph nodes ([place]) 12/31/2006   History of B-cell lymphoma 12/05/2015   Hypertension    Kidney carcinoma ([place]) 03/01/2003   tumor seperate from lymphoma; found during routine screening for lymphoma   NHL (non-Hodgkin's lymphoma) ([place]) 12/06/2014   Nodular lymphoma involving  intra-abdominal lymph nodes ([place]) 12/20/2011   Non Hodgkin's lymphoma ([place])     Past Surgical History:  Procedure Laterality Date   APPENDECTOMY     BUBBLE STUDY  08/01/2021   Procedure: BUBBLE STUDY;  Surgeon: Thayer Headings, MD;  Location: [facility];  Service: Cardiovascular;;   CARDIOVASCULAR STRESS TEST  10/19/2008   EF 52%, NO ISCHEMIA   CARDIOVERSION N/A 08/01/2021   Procedure: CARDIOVERSION;  Surgeon: Thayer Headings, MD;  Location: [facility];  Service: Cardiovascular;  Laterality: N/A;   [place]  2006-2007   right laparoscopic   HERNIA REPAIR  2006-2007   left laparoscopic   NEPHRECTOMY     right kidney   SHOULDER SURGERY     TEE WITHOUT CARDIOVERSION N/A 08/01/2021   Procedure: TRANSESOPHAGEAL ECHOCARDIOGRAM (TEE);  Surgeon: Acie Fredrickson Wonda Cheng, MD;  [facility] END[facility];  Service: Cardiovascular;  Laterality: N/A;   TOTAL KNEE ARTHROPLASTY Left 03/25/2013   Dr Marlou Sa   TOTAL KNEE ARTHROPLASTY Left 03/24/2013   Procedure: LEFT TOTAL KNEE ARTHROPLASTY;  Surgeon: Meredith Pel, MD;  Location: [facility];  Service: Orthopedics;  Laterality: Left;  Left Total Knee Arthroplasty   US ECHOCARDIOGRAPHY  02/26/2006   EF 55-60%   VENTRAL HERNIA REPAIR  2003-2004   with mesh after biopsy    Social History   Tobacco Use  Smoking Status Never  Smokeless Tobacco Never    Social History   Substance and Sexual Activity  Alcohol Use Yes   Alcohol/week: 7.0 standard drinks of alcohol   Types: 6 Glasses of wine, 1 Cans of beer per week   Comment: social    Family History  Problem Relation Age of Onset   Uterine cancer Mother    Cervical cancer Mother    Coronary artery disease Father    Hypertension Father     Reviw of Systems:  Reviewed in the HPI.  All other systems are negative.   Physical Exam: Blood pressure 135/88, pulse 83, height '5\' 10"'$  (1.778 m), weight 214 lb (97.1 kg), SpO2 97 %.       GEN:  Well nourished, well  developed in no acute distress HEENT: Normal NECK: No JVD; No carotid bruits LYMPHATICS: No lymphadenopathy CARDIAC: RRR , no murmurs, rubs, gallops RESPIRATORY:  Clear to auscultation without rales, wheezing or rhonchi  ABDOMEN: Soft, non-tender, non-distended MUSCULOSKELETAL:  No edema; No deformity  SKIN: Warm and dry NEUROLOGIC:  Alert and oriented x 3   ECG:      October 09, 2022: Atrial flutter with variable AV block.  Heart rate is 83.  Incomplete right bundle branch block.  Assessment / Plan:   1.   atrial flutter : Brian Parker has recurrent atrial flutter.  He had atrial flutter last year but converted on his own.  Would like to refer him to Dr. Lovena Le for consideration of atrial flutter ablation.  We have never seen any evidence of atrial fibrillation.  Heart rate is well controlled.  Continue Xarelto.   2. Premature ventricular contractions:         2.  Hypertension: Diastolic blood pressure still mildly elevated.  He is on amlodipine 5 mg every other day, HCTZ 25 mg every other day.  We will increase the hydrochlorothiazide to 25 mg a day.  We will add potassium chloride 10 mill equivalent tablets-2 tablets a day.  Basic metabolic profile in 2 to 3 weeks.  I will see him in 3 months  for follow-up visit.     Mertie Moores, MD  10/09/2022 10:19 AM    [address],  [place], [place]  [address] Pager [phone] Phone: [phone]; Fax: [phone]

## 2022-10-09 ENCOUNTER — Encounter: Payer: Self-pay | Admitting: Cardiovascular Disease

## 2022-10-09 ENCOUNTER — Ambulatory Visit: Payer: Medicare HMO | Attending: Cardiovascular Disease | Admitting: Cardiovascular Disease

## 2022-10-09 VITALS — BP 135/88 | HR 83 | Ht 70.0 in | Wt 214.0 lb

## 2022-10-09 DIAGNOSIS — I483 Typical atrial flutter: Secondary | ICD-10-CM | POA: Diagnosis not present

## 2022-10-09 DIAGNOSIS — I10 Essential (primary) hypertension: Secondary | ICD-10-CM

## 2022-10-09 DIAGNOSIS — I480 Paroxysmal atrial fibrillation: Secondary | ICD-10-CM

## 2022-10-09 MED ORDER — POTASSIUM CHLORIDE CRYS ER 10 MEQ PO TBCR: 20.0000 meq | EXTENDED_RELEASE_TABLET | Freq: Every day | ORAL | 3 refills | Status: DC

## 2022-10-09 NOTE — Patient Instructions (Addendum)
Medication Instructions:  START K DUR 20 MEQ DAILY  INCREASE HYDROCHLOROTHIAZIDE 25 MG EVERY DAY  *If you need a refill on your cardiac medications before your next appointment, please call your pharmacy*   Lab Work: 2-3 WEEKS BMET If you have labs (blood work) drawn today and your tests are completely normal, you will receive your results only by: [place] (if you have MyChart) OR A paper copy in the mail If you have any lab test that is abnormal or we need to change your treatment, we will call you to review the results.   Testing/Procedures: NONE   Follow-Up: At [facility], you and your health needs are our priority.  As part of our continuing mission to provide you with exceptional heart care, we have created designated Provider Care Teams.  These Care Teams include your primary Cardiologist (physician) and Advanced Practice Providers (APPs -  Physician Assistants and Nurse Practitioners) who all work together to provide you with the care you need, when you need it.  We recommend signing up for the patient portal called "MyChart".  Sign up information is provided on this After Visit Summary.  MyChart is used to connect with patients for Virtual Visits (Telemedicine).  Patients are able to view lab/test results, encounter notes, upcoming appointments, etc.  Non-urgent messages can be sent to your provider as well.   To learn more about what you can do with MyChart, go to [email or web address].    Your next appointment:   3 month(s)  The format for your next appointment:   In Person  Provider:   Ermalinda Barrios, PA-C or Richardson Dopp, PA-C      Mertie Moores, MD  Other Instructions REFERRAL TO  DR Lovena Le  FOR AFIB ABLATION   Important Information About Sugar

## 2022-10-10 ENCOUNTER — Other Ambulatory Visit: Payer: Self-pay | Admitting: Cardiovascular Disease

## 2022-10-24 ENCOUNTER — Ambulatory Visit: Payer: Medicare HMO | Attending: Cardiovascular Disease

## 2022-10-24 DIAGNOSIS — I483 Typical atrial flutter: Secondary | ICD-10-CM

## 2022-10-24 DIAGNOSIS — I10 Essential (primary) hypertension: Secondary | ICD-10-CM | POA: Diagnosis not present

## 2022-10-25 LAB — BASIC METABOLIC PANEL
BUN/Creatinine Ratio: 23 (ref 10–24)
BUN: 24 mg/dL (ref 8–27)
CO2: 25 mmol/L (ref 20–29)
Calcium: 9.5 mg/dL (ref 8.6–10.2)
Chloride: 101 mmol/L (ref 96–106)
Creatinine, Ser: 1.04 mg/dL (ref 0.76–1.27)
Glucose: 108 mg/dL — ABNORMAL HIGH (ref 70–99)
Potassium: 3.7 mmol/L (ref 3.5–5.2)
Sodium: 142 mmol/L (ref 134–144)
eGFR: 77 mL/min/{1.73_m2} (ref >59)

## 2022-10-26 DIAGNOSIS — E559 Vitamin D deficiency, unspecified: Secondary | ICD-10-CM | POA: Diagnosis not present

## 2022-11-14 DIAGNOSIS — M9904 Segmental and somatic dysfunction of sacral region: Secondary | ICD-10-CM | POA: Diagnosis not present

## 2022-11-14 DIAGNOSIS — M62830 Muscle spasm of back: Secondary | ICD-10-CM | POA: Diagnosis not present

## 2022-11-14 DIAGNOSIS — M9903 Segmental and somatic dysfunction of lumbar region: Secondary | ICD-10-CM | POA: Diagnosis not present

## 2022-11-14 DIAGNOSIS — M546 Pain in thoracic spine: Secondary | ICD-10-CM | POA: Diagnosis not present

## 2022-11-14 DIAGNOSIS — M5441 Lumbago with sciatica, right side: Secondary | ICD-10-CM | POA: Diagnosis not present

## 2022-11-15 ENCOUNTER — Encounter: Payer: Self-pay | Admitting: Internal Medicine

## 2022-11-15 ENCOUNTER — Ambulatory Visit: Payer: Medicare HMO | Attending: Internal Medicine | Admitting: Internal Medicine

## 2022-11-15 VITALS — BP 126/72 | HR 58 | Ht 70.0 in | Wt 216.6 lb

## 2022-11-15 DIAGNOSIS — I10 Essential (primary) hypertension: Secondary | ICD-10-CM | POA: Diagnosis not present

## 2022-11-15 DIAGNOSIS — I493 Ventricular premature depolarization: Secondary | ICD-10-CM

## 2022-11-15 DIAGNOSIS — I483 Typical atrial flutter: Secondary | ICD-10-CM

## 2022-11-15 NOTE — Progress Notes (Signed)
HPI Brian Parker is referred by Dr. Acie Fredrickson for evaluation of atrial flutter. He is a pleasant 72 yo man with a h/o HTN who has developed atrial flutter with a RVR. He has undergone DCCV and then had recurrent atrial flutter. He is going in and out of flutter. He has minimal palpitations but he does note that when he is out of rhythm he does not feel as well.  Allergies  Allergen Reactions   Losartan Other (See Comments)    Stomach cramps.     Current Outpatient Medications  Medication Sig Dispense Refill   amLODipine (NORVASC) 5 MG tablet Take 1 tablet (5 mg total) by mouth daily. 90 tablet 3   apixaban (ELIQUIS) 5 MG TABS tablet Take 1 tablet by mouth twice daily 60 tablet 5   doxazosin (CARDURA) 2 MG tablet Take 1 tablet (2 mg total) by mouth at bedtime. 90 tablet 2   Fish Oil-Krill Oil CAPS Take 1 capsule by mouth daily.     hydrochlorothiazide (HYDRODIURIL) 25 MG tablet Take 1 tablet by mouth once daily 90 tablet 3   metoprolol tartrate (LOPRESSOR) 25 MG tablet Take 1 tablet by mouth twice daily 60 tablet 2   Multiple Vitamin (MULTIVITAMIN) tablet Take 1 tablet by mouth daily.       potassium chloride (KLOR-CON M) 10 MEQ tablet Take 2 tablets (20 mEq total) by mouth daily. 180 tablet 3   propranolol (INDERAL) 10 MG tablet Take 1 tablet (10 mg total) by mouth 4 (four) times daily as needed. 60 tablet 11   tamsulosin (FLOMAX) 0.4 MG CAPS capsule Take 1 capsule by mouth daily.     vitamin C (ASCORBIC ACID) 500 MG tablet Take 500 mg by mouth daily.     No current facility-administered medications for this visit.     Past Medical History:  Diagnosis Date   Cancer of kidney, secondary ([place]) 01/06/2010   right kidney//right kidney removed//left kidney functions WDL   Chest pain    INTERMITTENT/15 years ago//stress induced   Edema of lower extremity    Elevated prostate specific antigen (PSA) 64/33/2951   Follicular lymphoma grade I of intra-abdominal lymph nodes ([place]) 12/31/2006    History of B-cell lymphoma 12/05/2015   Hypertension    Kidney carcinoma ([place]) 03/01/2003   tumor seperate from lymphoma; found during routine screening for lymphoma   NHL (non-Hodgkin's lymphoma) ([place]) 12/06/2014   Nodular lymphoma involving intra-abdominal lymph nodes ([place]) 12/20/2011   Non Hodgkin's lymphoma ([place])     ROS:   All systems reviewed and negative except as noted in the HPI.   Past Surgical History:  Procedure Laterality Date   APPENDECTOMY     BUBBLE STUDY  08/01/2021   Procedure: BUBBLE STUDY;  Surgeon: Thayer Headings, MD;  Location: [facility];  Service: Cardiovascular;;   CARDIOVASCULAR STRESS TEST  10/19/2008   EF 52%, NO ISCHEMIA   CARDIOVERSION N/A 08/01/2021   Procedure: CARDIOVERSION;  Surgeon: Thayer Headings, MD;  Location: [facility];  Service: Cardiovascular;  Laterality: N/A;   [place]  2006-2007   right laparoscopic   HERNIA REPAIR  2006-2007   left laparoscopic   NEPHRECTOMY     right kidney   SHOULDER SURGERY     TEE WITHOUT CARDIOVERSION N/A 08/01/2021   Procedure: TRANSESOPHAGEAL ECHOCARDIOGRAM (TEE);  Surgeon: Acie Fredrickson Wonda Cheng, MD;  Location: [facility];  Service: Cardiovascular;  Laterality: N/A;   TOTAL KNEE  ARTHROPLASTY Left 03/25/2013   Dr Marlou Sa   TOTAL KNEE ARTHROPLASTY Left 03/24/2013   Procedure: LEFT TOTAL KNEE ARTHROPLASTY;  Surgeon: Meredith Pel, MD;  Location: [facility];  Service: Orthopedics;  Laterality: Left;  Left Total Knee Arthroplasty   US ECHOCARDIOGRAPHY  02/26/2006   EF 55-60%   VENTRAL HERNIA REPAIR  2003-2004   with mesh after biopsy     Family History  Problem Relation Age of Onset   Uterine cancer Mother    Cervical cancer Mother    Coronary artery disease Father    Hypertension Father      Social History   Socioeconomic History   Marital status: Married    Spouse name: Not on file   Number of children: Not on file   Years of education: Not on file   Highest education  level: Not on file  Occupational History   Not on file  Tobacco Use   Smoking status: Never   Smokeless tobacco: Never  Vaping Use   Vaping Use: Never used  Substance and Sexual Activity   Alcohol use: Yes    Alcohol/week: 7.0 standard drinks of alcohol    Types: 6 Glasses of wine, 1 Cans of beer per week    Comment: social   Drug use: No   Sexual activity: Not on file  Other Topics Concern   Not on file  Social History Narrative   Not on file   Social Determinants of Health   Financial Resource Strain: Not on file  Food Insecurity: Not on file  Transportation Needs: Not on file  Physical Activity: Not on file  Stress: Not on file  Social Connections: Not on file  Intimate Partner Violence: Not on file     BP 126/72   Pulse (!) 58   Ht '5\' 10"'$  (1.778 m)   Wt 216 lb 9.6 oz (98.2 kg)   SpO2 98%   BMI 31.08 kg/m   Physical Exam:  Well appearing NAD HEENT: Unremarkable Neck:  No JVD, no thyromegally Lymphatics:  No adenopathy Back:  No CVA tenderness Lungs:  Clear with no wheezes HEART:  Regular rate rhythm, no murmurs, no rubs, no clicks Abd:  soft, positive bowel sounds, no organomegally, no rebound, no guarding Ext:  2 plus pulses, no edema, no cyanosis, no clubbing Skin:  No rashes no nodules Neuro:  CN II through XII intact, motor grossly intact  EKG - nsr  Assess/Plan: Recurrent atrial flutter - I have discussed the treatment options with the patient and recommended EP study and ablation of typical atrial flutter. He will call us if he wishes to proceed.  2. Coags - he will continue his eliquis. Once he has undergone ablation we will consider stopping anti-coagulation if he does not have any evidence of atrial fib.  3. HTN - his bp is well controlled. We will follow.   Carleene Overlie Derriona Branscom,MD

## 2022-11-15 NOTE — Patient Instructions (Addendum)
Medication Instructions:  Your physician recommends that you continue on your current medications as directed. Please refer to the Current Medication list given to you today.  *If you need a refill on your cardiac medications before your next appointment, please call your pharmacy*  Lab Work: None ordered.  If you have labs (blood work) drawn today and your tests are completely normal, you will receive your results only by: [place] (if you have MyChart) OR A paper copy in the mail If you have any lab test that is abnormal or we need to change your treatment, we will call you to review the results.  Testing/Procedures: None ordered.  Follow-Up: Dates for an Atrial Flutter Ablation with Dr Cristopher Peru the Month of January.    January:  8, 15, 22, 29, 31.  Please contact us at [phone] or My Chart message [place] with your date selection, and we will schedule your Atrial Flutter Ablation with Dr. Lovena Le.   Cardiac Ablation Cardiac ablation is a procedure to destroy, or ablate, a small amount of heart tissue that is causing problems. The heart has many electrical connections. Sometimes, these connections are abnormal and can cause the heart to beat very fast or irregularly. Ablating the abnormal areas can improve the heart's rhythm or return it to normal. Ablation may be done for people who: Have irregular or rapid heartbeats (arrhythmias). Have Wolff-Parkinson-White syndrome. Have taken medicines for an arrhythmia that did not work or caused side effects. Have a high-risk heartbeat that may be life-threatening. Tell a health care provider about: Any allergies you have. All medicines you are taking, including vitamins, herbs, eye drops, creams, and over-the-counter medicines. Any problems you or family members have had with anesthesia. Any bleeding problems you have. Any surgeries you have had. Any medical conditions you have. Whether you are pregnant or may be pregnant. What  are the risks? Your health care provider will talk with you about risks. These may include: Infection. Bruising and bleeding. Stroke or blood clots. Damage to nearby structures or organs. Allergic reaction to medicines or dyes. Needing a pacemaker if the heart gets damaged. A pacemaker is a device that helps the heart beat normally. Failure of the procedure. A repeat procedure may be needed. What happens before the procedure? Medicines Ask your health care provider about: Changing or stopping your regular medicines. These include any heart rhythm medicines, diabetes medicines, or blood thinners you take. Taking medicines such as aspirin and ibuprofen. These medicines can thin your blood. Do not take them unless your health care provider tells you to. Taking over-the-counter medicines, vitamins, herbs, and supplements. General instructions Follow instructions from your health care provider about what you may eat and drink. If you will be going home right after the procedure, plan to have a responsible adult: Take you home from the hospital or clinic. You will not be allowed to drive. Care for you for the time you are told. Ask your health care provider what steps will be taken to prevent infection. What happens during the procedure?  An IV will be inserted into one of your veins. You may be given: A sedative. This helps you relax. Anesthesia. This will: Numb certain areas of your body. An incision will be made in your neck or your groin. A needle will be inserted through the incision and into a large vein in your neck or groin. The small, thin tube (catheter) will be inserted through the needle and moved to your heart. A type of  X-ray (fluoroscopy) will be used to help guide the catheter and provide images of the heart on a monitor. Dye may be injected through the catheter to help your surgeon see the area of the heart that needs treatment. Electrical currents will be sent from the  catheter to destroy heart tissue in certain areas. There are three types of energy that may be used to do this: Heat (radiofrequency energy). Laser energy. Extreme cold (cryoablation). When the tissue has been destroyed, the catheter will be removed. Pressure will be held on the insertion area to prevent bleeding. A bandage (dressing) will be placed over the insertion area. The procedure may vary among health care providers and hospitals. What happens after the procedure? Your blood pressure, heart rate and rhythm, breathing rate, and blood oxygen level will be monitored until you leave the hospital or clinic. Your insertion area will be checked for bleeding. You will need to lie still for a few hours. If your groin was used, you will need to keep your leg straight for a few hours after the catheter is removed. This information is not intended to replace advice given to you by your health care provider. Make sure you discuss any questions you have with your health care provider. Document Revised: 06/05/2022 Document Reviewed: 06/05/2022 Elsevier Patient Education  [place].

## 2022-11-19 DIAGNOSIS — M546 Pain in thoracic spine: Secondary | ICD-10-CM | POA: Diagnosis not present

## 2022-11-19 DIAGNOSIS — M9904 Segmental and somatic dysfunction of sacral region: Secondary | ICD-10-CM | POA: Diagnosis not present

## 2022-11-19 DIAGNOSIS — M9903 Segmental and somatic dysfunction of lumbar region: Secondary | ICD-10-CM | POA: Diagnosis not present

## 2022-11-19 DIAGNOSIS — M5441 Lumbago with sciatica, right side: Secondary | ICD-10-CM | POA: Diagnosis not present

## 2022-11-19 DIAGNOSIS — M62830 Muscle spasm of back: Secondary | ICD-10-CM | POA: Diagnosis not present

## 2022-11-19 NOTE — Addendum Note (Signed)
Addended by: Gwendlyn Deutscher on: 11/19/2022 02:52 PM   Modules accepted: Orders

## 2022-11-21 DIAGNOSIS — M9903 Segmental and somatic dysfunction of lumbar region: Secondary | ICD-10-CM | POA: Diagnosis not present

## 2022-11-21 DIAGNOSIS — M62830 Muscle spasm of back: Secondary | ICD-10-CM | POA: Diagnosis not present

## 2022-11-21 DIAGNOSIS — M546 Pain in thoracic spine: Secondary | ICD-10-CM | POA: Diagnosis not present

## 2022-11-21 DIAGNOSIS — M9904 Segmental and somatic dysfunction of sacral region: Secondary | ICD-10-CM | POA: Diagnosis not present

## 2022-11-21 DIAGNOSIS — M5441 Lumbago with sciatica, right side: Secondary | ICD-10-CM | POA: Diagnosis not present

## 2023-01-02 ENCOUNTER — Other Ambulatory Visit: Payer: Self-pay | Admitting: Cardiovascular Disease

## 2023-01-16 ENCOUNTER — Encounter: Payer: Self-pay | Admitting: Cardiovascular Disease

## 2023-01-16 NOTE — Progress Notes (Signed)
Brian Parker. Date of Birth  February 12, 1950 [facility]        [address]    [place]     [place], [place]  [address]      [phone]  Fax  [phone]     1. Hypertension 2. Non Hodgkins Lymphoma 3.  PVCs  4.  Atrial flutter :  Brian Parker has done well from a cardiac standpoint.  He recently had XRT for a growing lymph node in his abdomen.   His BP has been well controlled at home.    January 29, 2013: He continues to have issues with his non-hodgkins lymphoma.  He had XTR and has been declared in remission .  He had a  Left leg puncture would from a drill bit - never really healed well.  He recently had this would debrided and is scheduled to have a left knee replacement once this has healed.  He has not been able to exercise because of these leg issues.   Nov. 13, 2014: Brian Parker is doing well. His BP has been well controlled.  His lymphoma is still in remission.  Playing golf regularly.   May 19, 2014: Brian Parker is doing well.  Playing some golf.   BP has been well controlled.    He has been playing golf in the evenings ( walks 9 holes).   He is slowing losing wome weight.    June 27, 2015:   Brian Parker is doing well.  No CP .  Playing golf regularly .  Has been tracking what he eats .  Has been measuring his BP  several times a day   Sept. 1, 2017:  Doing well. Just got back from [place] playing golf.   Played with Brian Parker.  Is active.  Also went to [place] and [place].   Walking regularly .  Lymphoma appears to be under good control   Feb. 27, 2018 Doing well Has felt some palpitations Occurs several times ( 15-20 times this AM already )  Not related to activity Seems to be worse when he is sitting  Not associated with CP or dyspnea or pre-syncope.  Went to the ER , was found to have PVCs  Potassium was low. Sleeping well, more caffeine recently  Walks regularly  - walked 18 holes of golf on Sunday , no issues with walking   03/31/18  Feeling  well. BP is a bit higher today . Perhaps ate more salt than he should  No CP or dyspnea.  Has occasional Palpitations .  Has propranolol - does not take it  Snores,   Asked about OSA   September 22, 2018: Brian Parker is doing well.  BP is well controlled  has occasional PVCs  Playing golf regularly .   Walks most of the time   October 08, 2019: Brian Parker is seen today for follow-up visit.  He has a history of palpitations and has had PVCs diagnosed by EKG.  He has propanolol to take on an as-needed basis ( he has never taken)  He plays golf on a regular basis. No CP , no dyspnea   Nov. 22, 2021: Brian Parker is seen today for follow up of his palpitations /PVCs, HTN. Golfing is going well  No CP ,    July 19, 2021: Brian Parker is seen today for follow up of his PVCs, HTN. Hx  of lymphoma He has been having more palpitations recently   Has had a  recurrent chest cold  On multiple coarses of Abx  Saw Dr. Jacelyn Grip  Thought his HR was elevated.   Would not slow down  Tried several propranolol 10 mg - seemed to help  Constant for the past 3 days   CHADS2VASC is 2   ( age, HTN, )  October 05, 2021: Seems to be staying in sinus rhythm BP is well controlled.  Still playing golf  We discussed getting a Kardia mobile device  He and his wife try to walk every day .  Oct. 9, 2023 Brian Parker is seen today for follow up of his PAF , HTN CHADS2VASC is 2 .  Is on Eliquis  He is back in atrial flutter today. He has had atrial flutter in the past.  He was scheduled for cardioversion in August, 2022.  When he presented for his cardioversion, he was in sinus rhythm and the procedure was canceled.  Has a Kardia mobile, shows atrial flutter .  occasional variable AV block  Has lost about 20 lbs  Wt today 829 lbs   ] Diastolic BP is mildly elevated  Only takes the amlodipine 5 QOD and HCTZ QOD  Does not eat salty foods  Had unusual abdominal cramping in response to losartan. Willing crease the HCTZ to 25 mg every  day.  We will add potassium chloride 20 mEq a day.  Recheck basic metabolic profile in 2 to 3 weeks.  Feels well, no CP , no dyspnea   Golf game has not been great      Jan. 18, 2024 Brian Parker is seen for follow up of his paroxysmal atrial flutter, HTN On eliquis  Has been seen by Dr. Lovena Le for consideration of atrial flutter ablation  Still considering Flutter ablation   No recent palpitations Is on metoprolol 25 BID ,  eliquis 5 BID  Is active , plays golf,  walks every day   Will check lipids today , check LP(a) today  Father had first MI at 63.   CBC today ( long term anticoagulation )       Current Outpatient Medications on File Prior to Visit  Medication Sig Dispense Refill   amLODipine (NORVASC) 5 MG tablet Take 1 tablet (5 mg total) by mouth daily. 90 tablet 3   apixaban (ELIQUIS) 5 MG TABS tablet Take 1 tablet by mouth twice daily 60 tablet 5   doxazosin (CARDURA) 2 MG tablet Take 1 tablet (2 mg total) by mouth at bedtime. 90 tablet 2   Fish Oil-Krill Oil CAPS Take 1 capsule by mouth daily.     hydrochlorothiazide (HYDRODIURIL) 25 MG tablet Take 1 tablet by mouth once daily 90 tablet 3   metoprolol tartrate (LOPRESSOR) 25 MG tablet Take 1 tablet by mouth twice daily 60 tablet 9   Multiple Vitamin (MULTIVITAMIN) tablet Take 1 tablet by mouth daily.       potassium chloride (KLOR-CON) 10 MEQ tablet Take 20 mEq by mouth daily.     propranolol (INDERAL) 10 MG tablet Take 1 tablet (10 mg total) by mouth 4 (four) times daily as needed. 60 tablet 11   tamsulosin (FLOMAX) 0.4 MG CAPS capsule Take 1 capsule by mouth daily.     vitamin C (ASCORBIC ACID) 500 MG tablet Take 500 mg by mouth daily.     No current facility-administered medications on file prior to visit.  Amlodipine 5 mg a day   Allergies  Allergen Reactions   Losartan Other (See Comments)    Stomach cramps.  Past Medical History:  Diagnosis Date   Cancer of kidney, secondary ([place]) 01/06/2010   right  kidney//right kidney removed//left kidney functions WDL   Chest pain    INTERMITTENT/15 years ago//stress induced   Edema of lower extremity    Elevated prostate specific antigen (PSA) 71/69/6789   Follicular lymphoma grade I of intra-abdominal lymph nodes ([place]) 12/31/2006   History of B-cell lymphoma 12/05/2015   Hypertension    Kidney carcinoma ([place]) 03/01/2003   tumor seperate from lymphoma; found during routine screening for lymphoma   NHL (non-Hodgkin's lymphoma) ([place]) 12/06/2014   Nodular lymphoma involving intra-abdominal lymph nodes ([place]) 12/20/2011   Non Hodgkin's lymphoma ([place])     Past Surgical History:  Procedure Laterality Date   APPENDECTOMY     BUBBLE STUDY  08/01/2021   Procedure: BUBBLE STUDY;  Surgeon: Thayer Headings, MD;  Location: [facility];  Service: Cardiovascular;;   CARDIOVASCULAR STRESS TEST  10/19/2008   EF 52%, NO ISCHEMIA   CARDIOVERSION N/A 08/01/2021   Procedure: CARDIOVERSION;  Surgeon: Thayer Headings, MD;  Location: [facility];  Service: Cardiovascular;  Laterality: N/A;   [place]  2006-2007   right laparoscopic   HERNIA REPAIR  2006-2007   left laparoscopic   NEPHRECTOMY     right kidney   SHOULDER SURGERY     TEE WITHOUT CARDIOVERSION N/A 08/01/2021   Procedure: TRANSESOPHAGEAL ECHOCARDIOGRAM (TEE);  Surgeon: Acie Fredrickson Wonda Cheng, MD;  Location: [facility] ENDOSCOPY;  Service: Cardiovascular;  Laterality: N/A;   TOTAL KNEE ARTHROPLASTY Left 03/25/2013   Dr Marlou Sa   TOTAL KNEE ARTHROPLASTY Left 03/24/2013   Procedure: LEFT TOTAL KNEE ARTHROPLASTY;  Surgeon: Meredith Pel, MD;  Location: [facility];  Service: Orthopedics;  Laterality: Left;  Left Total Knee Arthroplasty   US ECHOCARDIOGRAPHY  02/26/2006   EF 55-60%   VENTRAL HERNIA REPAIR  2003-2004   with mesh after biopsy    Social History   Tobacco Use  Smoking Status Never  Smokeless Tobacco Never    Social History   Substance and Sexual Activity  Alcohol Use Yes    Alcohol/week: 7.0 standard drinks of alcohol   Types: 6 Glasses of wine, 1 Cans of beer per week   Comment: social    Family History  Problem Relation Age of Onset   Uterine cancer Mother    Cervical cancer Mother    Coronary artery disease Father    Hypertension Father     Reviw of Systems:  Reviewed in the HPI.  All other systems are negative.   Physical Exam: Blood pressure 130/74, pulse 60, height '5\' 10"'$  (1.778 m), weight 215 lb (97.5 kg), SpO2 99 %.       GEN:  Well nourished, well developed in no acute distress HEENT: Normal NECK: No JVD; No carotid bruits LYMPHATICS: No lymphadenopathy CARDIAC: RRR , no murmurs, rubs, gallops RESPIRATORY:  Clear to auscultation without rales, wheezing or rhonchi  ABDOMEN: Soft, non-tender, non-distended MUSCULOSKELETAL:  No edema; No deformity  SKIN: Warm and dry NEUROLOGIC:  Alert and oriented x 3   ECG:        Assessment / Plan:   1.   atrial flutter : He has been seen by Dr. Lovena Le.  I offered him ablation.  Kyre still considering his options.  Continue Eliquis.  Continue metoprolol.  Will check labs today including a basic metabolic profile and CBC since he is on long-term anticoagulation.   2. Premature ventricular  contractions:         3.   Hypertension: Blood pressure is well-controlled.   General risk assessment: Will check lipids, lipoprotein a.  His father had his first heart attack at age 21.         Mertie Moores, MD  01/17/2023 9:29 AM    [address] [place],  [place], [place]  [address] Pager [phone] Phone: [phone]; Fax: [phone]

## 2023-01-17 ENCOUNTER — Ambulatory Visit: Payer: Medicare HMO | Attending: Cardiovascular Disease | Admitting: Cardiovascular Disease

## 2023-01-17 VITALS — BP 130/74 | HR 60 | Ht 70.0 in | Wt 215.0 lb

## 2023-01-17 DIAGNOSIS — Z7901 Long term (current) use of anticoagulants: Secondary | ICD-10-CM | POA: Diagnosis not present

## 2023-01-17 DIAGNOSIS — I10 Essential (primary) hypertension: Secondary | ICD-10-CM

## 2023-01-17 DIAGNOSIS — I480 Paroxysmal atrial fibrillation: Secondary | ICD-10-CM

## 2023-01-17 LAB — CBC

## 2023-01-17 NOTE — Patient Instructions (Signed)
Medication Instructions:  Your physician recommends that you continue on your current medications as directed. Please refer to the Current Medication list given to you today.  *If you need a refill on your cardiac medications before your next appointment, please call your pharmacy*   Lab Work: Lipids, BMET, ALT, Lipoprotein(a), CBC today If you have labs (blood work) drawn today and your tests are completely normal, you will receive your results only by: [facility] (if you have MyChart) OR A paper copy in the mail If you have any lab test that is abnormal or we need to change your treatment, we will call you to review the results.   Testing/Procedures: NONE   Follow-Up: At [facility], you and your health needs are our priority.  As part of our continuing mission to provide you with exceptional heart care, we have created designated Provider Care Teams.  These Care Teams include your primary Cardiologist (physician) and Advanced Practice Providers (APPs -  Physician Assistants and Nurse Practitioners) who all work together to provide you with the care you need, when you need it.  We recommend signing up for the patient portal called "MyChart".  Sign up information is provided on this After Visit Summary.  MyChart is used to connect with patients for Virtual Visits (Telemedicine).  Patients are able to view lab/test results, encounter notes, upcoming appointments, etc.  Non-urgent messages can be sent to your provider as well.   To learn more about what you can do with MyChart, go to [email or web address].    Your next appointment:   1 year(s)  Provider:   Mertie Moores, MD

## 2023-01-18 LAB — LIPOPROTEIN A (LPA): Lipoprotein (a): 36.6 nmol/L (ref <75.0)

## 2023-01-18 LAB — BASIC METABOLIC PANEL
BUN/Creatinine Ratio: 20 (ref 10–24)
BUN: 21 mg/dL (ref 8–27)
CO2: 29 mmol/L (ref 20–29)
Calcium: 9.1 mg/dL (ref 8.6–10.2)
Chloride: 101 mmol/L (ref 96–106)
Creatinine, Ser: 1.05 mg/dL (ref 0.76–1.27)
Glucose: 94 mg/dL (ref 70–99)
Potassium: 4.1 mmol/L (ref 3.5–5.2)
Sodium: 141 mmol/L (ref 134–144)
eGFR: 76 mL/min/{1.73_m2} (ref >59)

## 2023-01-18 LAB — CBC
Hematocrit: 49.1 % (ref 37.5–51.0)
Hemoglobin: 16.3 g/dL (ref 13.0–17.7)
MCH: 30.1 pg (ref 26.6–33.0)
MCHC: 33.2 g/dL (ref 31.5–35.7)
MCV: 91 fL (ref 79–97)
Platelets: 153 10*3/uL (ref 150–450)
RBC: 5.42 x10E6/uL (ref 4.14–5.80)
RDW: 12.9 % (ref 11.6–15.4)
WBC: 4.9 10*3/uL (ref 3.4–10.8)

## 2023-01-18 LAB — LIPID PANEL
Chol/HDL Ratio: 3.1 ratio (ref 0.0–5.0)
Cholesterol, Total: 171 mg/dL (ref 100–199)
HDL: 56 mg/dL (ref >39)
LDL Chol Calc (NIH): 103 mg/dL — ABNORMAL HIGH (ref 0–99)
Triglycerides: 60 mg/dL (ref 0–149)
VLDL Cholesterol Cal: 12 mg/dL (ref 5–40)

## 2023-01-18 LAB — ALT: ALT: 21 IU/L (ref 0–44)

## 2023-02-04 ENCOUNTER — Encounter: Payer: Self-pay | Admitting: Cardiovascular Disease

## 2023-02-04 MED ORDER — DOXAZOSIN MESYLATE 2 MG PO TABS: 2.0000 mg | ORAL_TABLET | Freq: Every day | ORAL | 3 refills | Status: DC

## 2023-03-04 DIAGNOSIS — D225 Melanocytic nevi of trunk: Secondary | ICD-10-CM | POA: Diagnosis not present

## 2023-03-04 DIAGNOSIS — D2272 Melanocytic nevi of left lower limb, including hip: Secondary | ICD-10-CM | POA: Diagnosis not present

## 2023-03-04 DIAGNOSIS — L821 Other seborrheic keratosis: Secondary | ICD-10-CM | POA: Diagnosis not present

## 2023-03-04 DIAGNOSIS — L720 Epidermal cyst: Secondary | ICD-10-CM | POA: Diagnosis not present

## 2023-03-04 DIAGNOSIS — D2261 Melanocytic nevi of right upper limb, including shoulder: Secondary | ICD-10-CM | POA: Diagnosis not present

## 2023-03-04 DIAGNOSIS — L570 Actinic keratosis: Secondary | ICD-10-CM | POA: Diagnosis not present

## 2023-03-04 DIAGNOSIS — L814 Other melanin hyperpigmentation: Secondary | ICD-10-CM | POA: Diagnosis not present

## 2023-03-04 DIAGNOSIS — D2271 Melanocytic nevi of right lower limb, including hip: Secondary | ICD-10-CM | POA: Diagnosis not present

## 2023-03-04 DIAGNOSIS — D2262 Melanocytic nevi of left upper limb, including shoulder: Secondary | ICD-10-CM | POA: Diagnosis not present

## 2023-03-04 DIAGNOSIS — D1801 Hemangioma of skin and subcutaneous tissue: Secondary | ICD-10-CM | POA: Diagnosis not present

## 2023-03-06 DIAGNOSIS — M9903 Segmental and somatic dysfunction of lumbar region: Secondary | ICD-10-CM | POA: Diagnosis not present

## 2023-03-06 DIAGNOSIS — M5441 Lumbago with sciatica, right side: Secondary | ICD-10-CM | POA: Diagnosis not present

## 2023-03-06 DIAGNOSIS — M62830 Muscle spasm of back: Secondary | ICD-10-CM | POA: Diagnosis not present

## 2023-03-06 DIAGNOSIS — M9902 Segmental and somatic dysfunction of thoracic region: Secondary | ICD-10-CM | POA: Diagnosis not present

## 2023-03-06 DIAGNOSIS — M9904 Segmental and somatic dysfunction of sacral region: Secondary | ICD-10-CM | POA: Diagnosis not present

## 2023-03-06 DIAGNOSIS — M546 Pain in thoracic spine: Secondary | ICD-10-CM | POA: Diagnosis not present

## 2023-05-09 ENCOUNTER — Encounter: Payer: Self-pay | Admitting: Hematology and Oncology

## 2023-05-09 ENCOUNTER — Inpatient Hospital Stay: Payer: Medicare HMO | Admitting: Hematology and Oncology

## 2023-05-09 ENCOUNTER — Inpatient Hospital Stay: Payer: Medicare HMO | Attending: Hematology and Oncology

## 2023-05-09 ENCOUNTER — Other Ambulatory Visit: Payer: Self-pay

## 2023-05-09 VITALS — BP 152/109 | HR 71 | Temp 97.8°F | Resp 20 | Wt 213.0 lb

## 2023-05-09 DIAGNOSIS — Z85528 Personal history of other malignant neoplasm of kidney: Secondary | ICD-10-CM

## 2023-05-09 DIAGNOSIS — Z8572 Personal history of non-Hodgkin lymphomas: Secondary | ICD-10-CM | POA: Diagnosis not present

## 2023-05-09 DIAGNOSIS — Z85828 Personal history of other malignant neoplasm of skin: Secondary | ICD-10-CM | POA: Diagnosis not present

## 2023-05-09 DIAGNOSIS — Z905 Acquired absence of kidney: Secondary | ICD-10-CM | POA: Diagnosis not present

## 2023-05-09 DIAGNOSIS — Z79899 Other long term (current) drug therapy: Secondary | ICD-10-CM | POA: Diagnosis not present

## 2023-05-09 DIAGNOSIS — E669 Obesity, unspecified: Secondary | ICD-10-CM

## 2023-05-09 DIAGNOSIS — R109 Unspecified abdominal pain: Secondary | ICD-10-CM | POA: Diagnosis not present

## 2023-05-09 DIAGNOSIS — L720 Epidermal cyst: Secondary | ICD-10-CM | POA: Diagnosis not present

## 2023-05-09 LAB — COMPREHENSIVE METABOLIC PANEL
ALT: 19 U/L (ref 0–44)
AST: 20 U/L (ref 15–41)
Albumin: 4.5 g/dL (ref 3.5–5.0)
Alkaline Phosphatase: 53 U/L (ref 38–126)
Anion gap: 6 (ref 5–15)
BUN: 20 mg/dL (ref 8–23)
CO2: 31 mmol/L (ref 22–32)
Calcium: 9.3 mg/dL (ref 8.9–10.3)
Chloride: 104 mmol/L (ref 98–111)
Creatinine, Ser: 1.07 mg/dL (ref 0.61–1.24)
GFR, Estimated: >60 mL/min (ref >60)
Glucose, Bld: 102 mg/dL — ABNORMAL HIGH (ref 70–99)
Potassium: 3.9 mmol/L (ref 3.5–5.1)
Sodium: 141 mmol/L (ref 135–145)
Total Bilirubin: 1 mg/dL (ref 0.3–1.2)
Total Protein: 6.7 g/dL (ref 6.5–8.1)

## 2023-05-09 LAB — CBC WITH DIFFERENTIAL/PLATELET
Abs Immature Granulocytes: 0.01 10*3/uL (ref 0.00–0.07)
Basophils Absolute: 0 10*3/uL (ref 0.0–0.1)
Basophils Relative: 1 %
Eosinophils Absolute: 0.1 10*3/uL (ref 0.0–0.5)
Eosinophils Relative: 3 %
HCT: 47.7 % (ref 39.0–52.0)
Hemoglobin: 16.8 g/dL (ref 13.0–17.0)
Immature Granulocytes: 0 %
Lymphocytes Relative: 19 %
Lymphs Abs: 1 10*3/uL (ref 0.7–4.0)
MCH: 31.3 pg (ref 26.0–34.0)
MCHC: 35.2 g/dL (ref 30.0–36.0)
MCV: 89 fL (ref 80.0–100.0)
Monocytes Absolute: 0.3 10*3/uL (ref 0.1–1.0)
Monocytes Relative: 7 %
Neutro Abs: 3.6 10*3/uL (ref 1.7–7.7)
Neutrophils Relative %: 70 %
Platelets: 150 10*3/uL (ref 150–400)
RBC: 5.36 MIL/uL (ref 4.22–5.81)
RDW: 13 % (ref 11.5–15.5)
WBC: 5 10*3/uL (ref 4.0–10.5)
nRBC: 0 % (ref 0.0–0.2)

## 2023-05-09 NOTE — Assessment & Plan Note (Signed)
We discussed the importance of regular sunscreen and avoidance of excessive sun exposure He has appointment to see his dermatologist for excision of a cyst behind the right ear

## 2023-05-09 NOTE — Assessment & Plan Note (Signed)
He has lost a lot of weight since our last visit and appears motivated to continue with his weight loss journey through dietary modification and exercise I reviewed with the patient importance of integrating strength training I continue to motivate the patient and gave him resources to read

## 2023-05-09 NOTE — Progress Notes (Signed)
[facility] OFFICE PROGRESS NOTE  Patient Care Team: Ileana Ladd, MD (Inactive) as PCP - General (Family Medicine) Nahser, Deloris Ping, MD as PCP - Cardiology (Cardiology) Chipper Herb, MD (Inactive) as Consulting Physician (Radiation Oncology) Ihor Gully, MD (Inactive) as Consulting Physician (Urology) Artis Delay, MD as Consulting Physician (Hematology and Oncology)  ASSESSMENT & PLAN:  History of non-Hodgkin's lymphoma He has no signs or symptoms to suggest cancer recurrence We will continue close observation with history and physical examination once a year I do not recommend surveillance imaging study The patient is educated to watch for signs and symptoms of cancer recurrence   History of skin cancer We discussed the importance of regular sunscreen and avoidance of excessive sun exposure He has appointment to see his dermatologist for excision of a cyst behind the right ear  History of kidney cancer His renal function is stable  Class 1 obesity He has lost a lot of weight since our last visit and appears motivated to continue with his weight loss journey through dietary modification and exercise I reviewed with the patient importance of integrating strength training I continue to motivate the patient and gave him resources to read  No orders of the defined types were placed in this encounter.   All questions were answered. The patient knows to call the clinic with any problems, questions or concerns. The total time spent in the appointment was 30 minutes encounter with patients including review of chart and various tests results, discussions about plan of care and coordination of care plan   Artis Delay, MD 05/09/2023 12:29 PM  INTERVAL HISTORY: Please see below for problem oriented charting. he returns for follow-up for history of multiple malignancies He is doing well He has lost a lot of weight since last visit through intentional dietary  modification and exercise, although he has reached a plateau He continues to have mild intermittent abdominal discomfort that comes and goes We spent a lot of time reviewing the importance of preventative care  REVIEW OF SYSTEMS:   Constitutional: Denies fevers, chills or abnormal weight loss Eyes: Denies blurriness of vision Ears, nose, mouth, throat, and face: Denies mucositis or sore throat Respiratory: Denies cough, dyspnea or wheezes Cardiovascular: Denies palpitation, chest discomfort or lower extremity swelling Gastrointestinal:  Denies nausea, heartburn or change in bowel habits Skin: Denies abnormal skin rashes Lymphatics: Denies new lymphadenopathy or easy bruising Neurological:Denies numbness, tingling or new weaknesses Behavioral/Psych: Mood is stable, no new changes  All other systems were reviewed with the patient and are negative.  I have reviewed the past medical history, past surgical history, social history and family history with the patient and they are unchanged from previous note.  ALLERGIES:  is allergic to losartan.  MEDICATIONS:  Current Outpatient Medications  Medication Sig Dispense Refill   amLODipine (NORVASC) 5 MG tablet Take 1 tablet (5 mg total) by mouth daily. 90 tablet 3   apixaban (ELIQUIS) 5 MG TABS tablet Take 1 tablet by mouth twice daily 60 tablet 5   doxazosin (CARDURA) 2 MG tablet Take 1 tablet (2 mg total) by mouth at bedtime. 90 tablet 3   Fish Oil-Krill Oil CAPS Take 1 capsule by mouth daily.     hydrochlorothiazide (HYDRODIURIL) 25 MG tablet Take 1 tablet by mouth once daily 90 tablet 3   metoprolol tartrate (LOPRESSOR) 25 MG tablet Take 1 tablet by mouth twice daily 60 tablet 9   Multiple Vitamin (MULTIVITAMIN) tablet Take 1 tablet by mouth daily.  potassium chloride (KLOR-CON) 10 MEQ tablet Take 20 mEq by mouth daily.     propranolol (INDERAL) 10 MG tablet Take 1 tablet (10 mg total) by mouth 4 (four) times daily as needed. 60 tablet  11   tamsulosin (FLOMAX) 0.4 MG CAPS capsule Take 1 capsule by mouth daily.     vitamin C (ASCORBIC ACID) 500 MG tablet Take 500 mg by mouth daily.     No current facility-administered medications for this visit.    SUMMARY OF ONCOLOGIC HISTORY: Oncology History  History of non-Hodgkin's lymphoma  12/31/2001 Initial Diagnosis   He was initially diagnosed with a follicular grade 1,  B-cell, non-Hodgkin's lymphoma in 2003 .  He has also been diagnosed with metachronous primary tumors of his right kidney and ultimately underwent initial partial nephrectomy in March 2004 then a completion nephrectomy in January 2011. He was treated with 8 cycles of CHOP/Rituxan through July 2003. He was started on maintenance rituximab but only had 2 cycles due to development of delayed neutropenia.  There was suspicion for early progression on a CT scan done in July of 2009 which showed a single area of soft tissue density in the left periaortic region. This area remained stable on serial scans until a study done on 11/13/2011 which did show clear regrowth in this area enlarging from 2.6 x 0.9 cm to approximately 4.9 x 3.4 cm. A PET scan was done on 11/30/2011 which showed a single area of markedly abnormal activity with SUV up to 10.3 over the abnormal area of lymphadenopathy seen on CT scan. There were no other areas of activity.  He received 3000 cGy in 15 fractions to the left para-aortic lymph node mass between January 16 and 02/04/2012.    12/10/2017 Imaging   1. Stable CT of the abdomen and pelvis. No evidence for recurrent lymphoma. 2. Status post right nephrectomy. No evidence for local tumor recurrence or metastatic disease. 3. Status post repair of ventral abdominal wall hernia.   05/11/2020 Imaging   1. No findings of recurrent lymphoma. 2. Other imaging findings of potential clinical significance: Sigmoid colon diverticulosis. Stable prostatomegaly. 3. Aortic atherosclerosis.   Aortic  Atherosclerosis (ICD10-I70.0).       PHYSICAL EXAMINATION: ECOG PERFORMANCE STATUS: 0 - Asymptomatic  Vitals:   05/09/23 1105  BP: (!) 152/109  Pulse: 71  Resp: 20  Temp: 97.8 F (36.6 C)  SpO2: 98%   Filed Weights   05/09/23 1105  Weight: 213 lb (96.6 kg)    GENERAL:alert, no distress and comfortable SKIN: skin color, texture, turgor are normal, no rashes or significant lesions.  He has a benign cyst behind the right ear EYES: normal, Conjunctiva are pink and non-injected, sclera clear OROPHARYNX:no exudate, no erythema and lips, buccal mucosa, and tongue normal  NECK: supple, thyroid normal size, non-tender, without nodularity LYMPH:  no palpable lymphadenopathy in the cervical, axillary or inguinal LUNGS: clear to auscultation and percussion with normal breathing effort HEART: regular rate & rhythm and no murmurs and no lower extremity edema ABDOMEN:abdomen soft, non-tender and normal bowel sounds Musculoskeletal:no cyanosis of digits and no clubbing  NEURO: alert & oriented x 3 with fluent speech, no focal motor/sensory deficits  LABORATORY DATA:  I have reviewed the data as listed    Component Value Date/Time   NA 141 05/09/2023 1037   NA 141 01/17/2023 0936   NA 141 12/05/2017 1013   K 3.9 05/09/2023 1037   K 3.9 12/05/2017 1013   CL 104 05/09/2023 1037  CL 105 03/02/2013 0818   CO2 31 05/09/2023 1037   CO2 27 12/05/2017 1013   GLUCOSE 102 (H) 05/09/2023 1037   GLUCOSE 84 12/05/2017 1013   GLUCOSE 88 03/02/2013 0818   BUN 20 05/09/2023 1037   BUN 21 01/17/2023 0936   BUN 16.9 12/05/2017 1013   CREATININE 1.07 05/09/2023 1037   CREATININE 1.1 12/05/2017 1013   CALCIUM 9.3 05/09/2023 1037   CALCIUM 9.3 12/05/2017 1013   PROT 6.7 05/09/2023 1037   PROT 7.0 12/05/2017 1013   ALBUMIN 4.5 05/09/2023 1037   ALBUMIN 4.4 12/05/2017 1013   AST 20 05/09/2023 1037   AST 20 12/05/2017 1013   ALT 19 05/09/2023 1037   ALT 18 12/05/2017 1013   ALKPHOS 53  05/09/2023 1037   ALKPHOS 71 12/05/2017 1013   BILITOT 1.0 05/09/2023 1037   BILITOT 0.75 12/05/2017 1013   GFRNONAA >60 05/09/2023 1037   GFRAA 100 11/21/2020 0952    No results found for: "SPEP", "UPEP"  Lab Results  Component Value Date   WBC 5.0 05/09/2023   NEUTROABS 3.6 05/09/2023   HGB 16.8 05/09/2023   HCT 47.7 05/09/2023   MCV 89.0 05/09/2023   PLT 150 05/09/2023      Chemistry      Component Value Date/Time   NA 141 05/09/2023 1037   NA 141 01/17/2023 0936   NA 141 12/05/2017 1013   K 3.9 05/09/2023 1037   K 3.9 12/05/2017 1013   CL 104 05/09/2023 1037   CL 105 03/02/2013 0818   CO2 31 05/09/2023 1037   CO2 27 12/05/2017 1013   BUN 20 05/09/2023 1037   BUN 21 01/17/2023 0936   BUN 16.9 12/05/2017 1013   CREATININE 1.07 05/09/2023 1037   CREATININE 1.1 12/05/2017 1013      Component Value Date/Time   CALCIUM 9.3 05/09/2023 1037   CALCIUM 9.3 12/05/2017 1013   ALKPHOS 53 05/09/2023 1037   ALKPHOS 71 12/05/2017 1013   AST 20 05/09/2023 1037   AST 20 12/05/2017 1013   ALT 19 05/09/2023 1037   ALT 18 12/05/2017 1013   BILITOT 1.0 05/09/2023 1037   BILITOT 0.75 12/05/2017 1013

## 2023-05-09 NOTE — Assessment & Plan Note (Signed)
He has no signs or symptoms to suggest cancer recurrence We will continue close observation with history and physical examination once a year I do not recommend surveillance imaging study The patient is educated to watch for signs and symptoms of cancer recurrence

## 2023-05-09 NOTE — Assessment & Plan Note (Signed)
His renal function is stable 

## 2023-06-03 DIAGNOSIS — R972 Elevated prostate specific antigen [PSA]: Secondary | ICD-10-CM | POA: Diagnosis not present

## 2023-06-06 DIAGNOSIS — R972 Elevated prostate specific antigen [PSA]: Secondary | ICD-10-CM | POA: Diagnosis not present

## 2023-06-06 DIAGNOSIS — R351 Nocturia: Secondary | ICD-10-CM | POA: Diagnosis not present

## 2023-06-06 DIAGNOSIS — R3914 Feeling of incomplete bladder emptying: Secondary | ICD-10-CM | POA: Diagnosis not present

## 2023-06-06 DIAGNOSIS — N401 Enlarged prostate with lower urinary tract symptoms: Secondary | ICD-10-CM | POA: Diagnosis not present

## 2023-07-21 ENCOUNTER — Other Ambulatory Visit: Payer: Self-pay | Admitting: Cardiovascular Disease

## 2023-08-28 IMAGING — MR MR PROSTATE WO/W CM
12 series · 48 of 48 positions shown · IV contrast (multihance)
Comparison: CT abdomen and pelvis from 7375.

CLINICAL DATA: A 71-year-old male presents for evaluation of
elevated PSA to 9.33. Prior negative biopsy in 2727.

EXAM:
MR PROSTATE WITHOUT AND WITH CONTRAST
TECHNIQUE: Multiplanar multisequence MRI images were obtained of the pelvis
centered about the prostate. Pre and post contrast images were
obtained.
CONTRAST:  20mL MULTIHANCE GADOBENATE DIMEGLUMINE 529 MG/ML IV SOLN

[Series 3: T2 · coronal · 3.0mm · 0.56mm/px · 1 of 23 slices shown (1 of 3)]
[im 1/23]
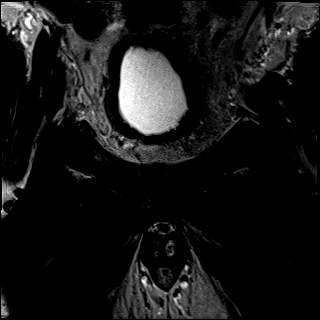

[Series 4: T1 · axial · 5.0mm · 1.25mm/px · 1 of 80 slices shown]
[im 1/80]
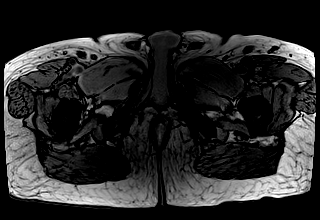

[Series 5: DWI · axial · 3.0mm · 1.93mm/px · z∈[+26,+104]mm · 2 of 80 slices shown (1 of 3)]
[im 1/80]
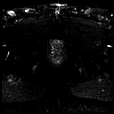
[im 80/80]
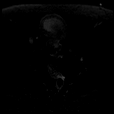

[Series 6: DWI · axial · 3.0mm · 1.93mm/px · 1 of 27 slices shown (2 of 3)]
[im 1/27]
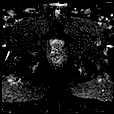

[Series 7: DWI · axial · 3.0mm · 1.93mm/px · 1 of 27 slices shown (3 of 3)]
[im 1/27]
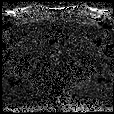

[Series 8: T2 · axial · 3.0mm · 0.56mm/px · 1 of 27 slices shown (2 of 3)]
[im 1/27]
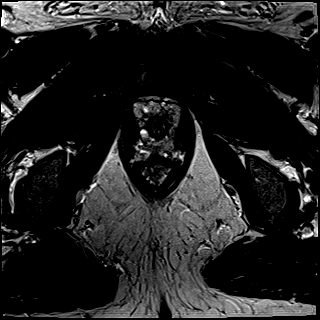

[Series 9: T2 · axial · 1.0mm · 1.04mm/px · z∈[+25,+104]mm · 2 of 80 slices shown (3 of 3)]
[im 1/80]
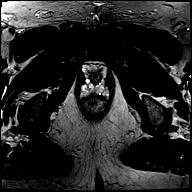
[im 80/80]
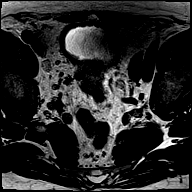

[Series 10: pre t1_twist_tra_dyn · axial · non-contrast · 3.5mm · 0.89mm/px · 1 of 22 slices shown]
[im 1/22]
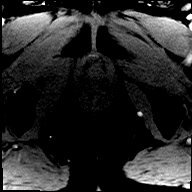

[Series 11: post t1_twist_tra_dyn-copy center · axial · non-contrast · 3.5mm · 0.89mm/px · z∈[+28,+102]mm · 17 of 660 slices shown]
[im 1/660]
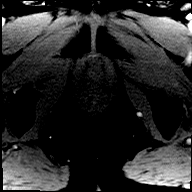
[im 42/660]
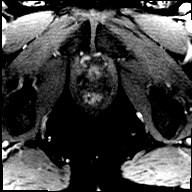
[im 83/660]
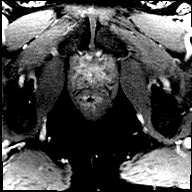
[im 124/660]
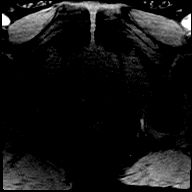
[im 165/660]
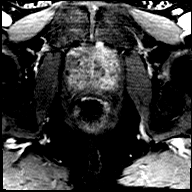
[im 206/660]
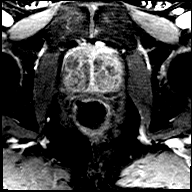
[im 248/660]
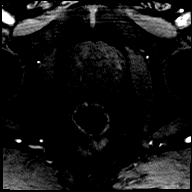
[im 289/660]
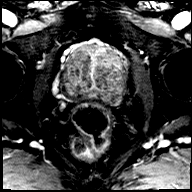
[im 330/660]
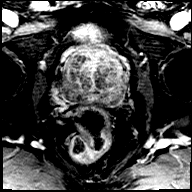
[im 371/660]
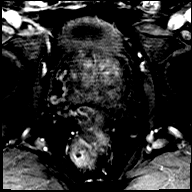
[im 412/660]
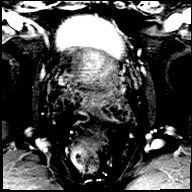
[im 454/660]
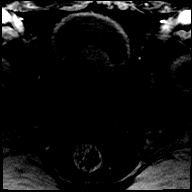
[im 495/660]
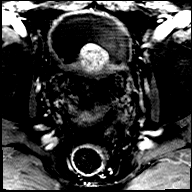
[im 536/660]
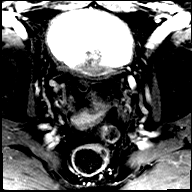
[im 577/660]
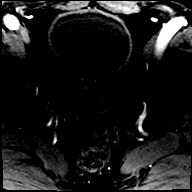
[im 618/660]
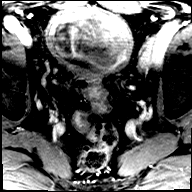
[im 660/660]
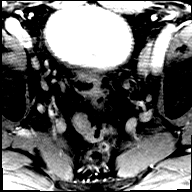

[Series 12: post t1_twist_tra_dyn-copy cent_sub · axial · 3.5mm · 0.89mm/px · z∈[+28,+102]mm · 17 of 638 slices shown]
[im 1/638]
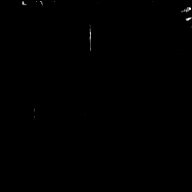
[im 40/638]
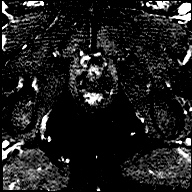
[im 80/638]
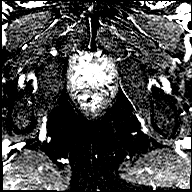
[im 120/638]
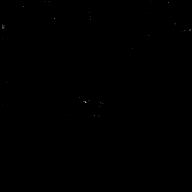
[im 160/638]
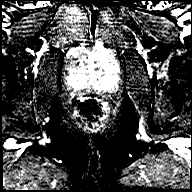
[im 200/638]
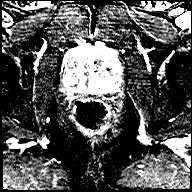
[im 239/638]
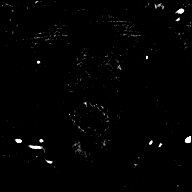
[im 279/638]
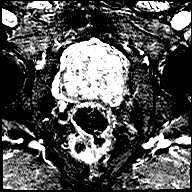
[im 319/638]
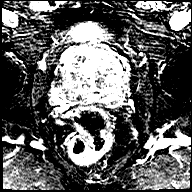
[im 359/638]
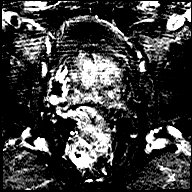
[im 399/638]
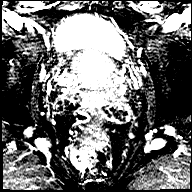
[im 438/638]
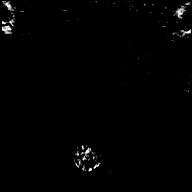
[im 478/638]
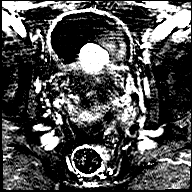
[im 518/638]
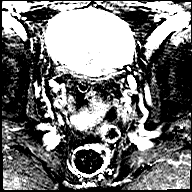
[im 558/638]
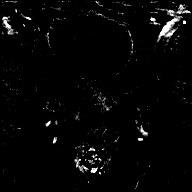
[im 598/638]
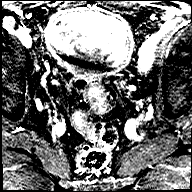
[im 638/638]
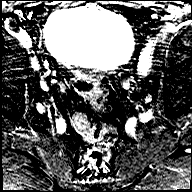

[Series 13: t1_vibe_dixon_tra_f · axial · 2.5mm · 0.91mm/px · z∈[-4,+194]mm · 2 of 80 slices shown]
[im 1/80]
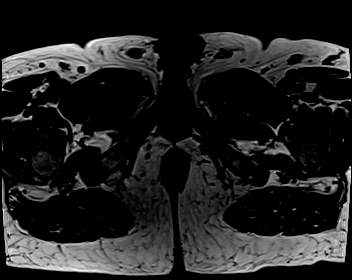
[im 80/80]
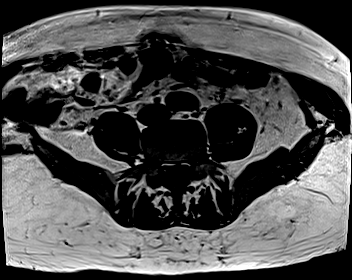

[Series 14: t1_vibe_dixon_tra_w · axial · 2.5mm · 0.91mm/px · z∈[-4,+194]mm · 2 of 80 slices shown]
[im 1/80]
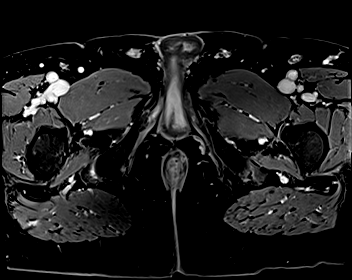
[im 80/80]
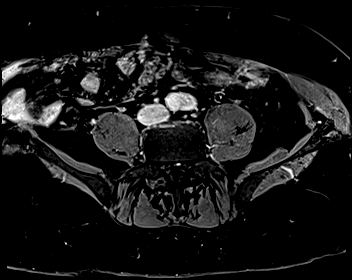

[48 of 48 positions shown; findings below may reference images not displayed]

FINDINGS: Prostate:

Peripheral zone: Extruded BPH nodules at the prostate base and
partially extruded nodule at the RIGHT prostate apex. Near the RIGHT
prostate apex there is however an area of heterogeneity on ADC
predominantly dark measuring 10 mm (image 6) this is at the
posterolateral RIGHT prostate apex adjacent to a partially extruded
BPH nodule. This does not show strong signs of restricted diffusion
on the calculated high B value. PIRADS category 3, no added
enhancement on post-contrast imaging in the dynamic portion of the
exam.

Transitional zone: No sign of high-risk lesion in the transitional
zone.

Volume: 82 cc

Transcapsular spread:  Absent

Seminal vesicle involvement: Absent

Neurovascular bundle involvement: Absent

Pelvic adenopathy: Absent

Bone metastasis: Absent

Other findings: Colonic diverticulosis. Moderate patulous appearance
of the distal LEFT ureter without clear cause though present on
imaging dating back to 7375.
IMPRESSION: 1. PIRADS category 3 lesion in the RIGHT posterolateral prostate
apex.
2. No evidence of extracapsular extension, seminal vesicle invasion,
or metastatic disease in the pelvis
3. Patulous appearance of the distal LEFT ureter shows some
chronicity, present on studies as far back as 7375, significance
uncertain. Transition occurring near the LEFT ureterovesicular
orifice but without visible lesion. Correlate with prior cystoscopy
results if available, if not available or recently performed could
consider follow-up as warranted.

## 2023-09-09 ENCOUNTER — Other Ambulatory Visit: Payer: Self-pay | Admitting: Cardiovascular Disease

## 2023-09-09 DIAGNOSIS — I480 Paroxysmal atrial fibrillation: Secondary | ICD-10-CM

## 2023-09-09 NOTE — Telephone Encounter (Signed)
Prescription refill request for Eliquis received. Indication: Afib  Last office visit: 01/17/23 (Nahser)  Scr: 1.07 (05/09/23)  Age: 73 Weight: 96.6kg  Appropriate dose. Refill sent.

## 2023-12-04 ENCOUNTER — Other Ambulatory Visit: Payer: Self-pay | Admitting: Cardiovascular Disease

## 2023-12-10 DIAGNOSIS — Z13220 Encounter for screening for lipoid disorders: Secondary | ICD-10-CM | POA: Diagnosis not present

## 2023-12-10 DIAGNOSIS — Z0000 Encounter for general adult medical examination without abnormal findings: Secondary | ICD-10-CM | POA: Diagnosis not present

## 2023-12-10 DIAGNOSIS — I493 Ventricular premature depolarization: Secondary | ICD-10-CM | POA: Diagnosis not present

## 2023-12-10 DIAGNOSIS — N401 Enlarged prostate with lower urinary tract symptoms: Secondary | ICD-10-CM | POA: Diagnosis not present

## 2023-12-10 DIAGNOSIS — Z8572 Personal history of non-Hodgkin lymphomas: Secondary | ICD-10-CM | POA: Diagnosis not present

## 2023-12-10 DIAGNOSIS — Z131 Encounter for screening for diabetes mellitus: Secondary | ICD-10-CM | POA: Diagnosis not present

## 2023-12-10 DIAGNOSIS — I10 Essential (primary) hypertension: Secondary | ICD-10-CM | POA: Diagnosis not present

## 2023-12-10 DIAGNOSIS — I4892 Unspecified atrial flutter: Secondary | ICD-10-CM | POA: Diagnosis not present

## 2023-12-10 DIAGNOSIS — R109 Unspecified abdominal pain: Secondary | ICD-10-CM | POA: Diagnosis not present

## 2023-12-10 DIAGNOSIS — I700 Atherosclerosis of aorta: Secondary | ICD-10-CM | POA: Diagnosis not present

## 2023-12-10 LAB — HEMOGLOBIN A1C: A1c: 5.3

## 2023-12-12 DIAGNOSIS — N401 Enlarged prostate with lower urinary tract symptoms: Secondary | ICD-10-CM | POA: Diagnosis not present

## 2023-12-12 DIAGNOSIS — R972 Elevated prostate specific antigen [PSA]: Secondary | ICD-10-CM | POA: Diagnosis not present

## 2023-12-12 DIAGNOSIS — R3914 Feeling of incomplete bladder emptying: Secondary | ICD-10-CM | POA: Diagnosis not present

## 2024-01-15 ENCOUNTER — Encounter: Payer: Self-pay | Admitting: Cardiovascular Disease

## 2024-01-15 ENCOUNTER — Ambulatory Visit: Payer: Medicare HMO | Attending: Cardiovascular Disease | Admitting: Cardiovascular Disease

## 2024-01-15 VITALS — BP 134/80 | HR 74 | Ht 70.0 in | Wt 219.2 lb

## 2024-01-15 DIAGNOSIS — Z8249 Family history of ischemic heart disease and other diseases of the circulatory system: Secondary | ICD-10-CM | POA: Diagnosis not present

## 2024-01-15 DIAGNOSIS — E782 Mixed hyperlipidemia: Secondary | ICD-10-CM | POA: Diagnosis not present

## 2024-01-15 DIAGNOSIS — I493 Ventricular premature depolarization: Secondary | ICD-10-CM | POA: Diagnosis not present

## 2024-01-15 NOTE — Progress Notes (Signed)
 Brian Parker. Date of Birth  1950-09-12 [facility]        [address]    [address]     [place], [place]  [address]      [phone]  Fax  [phone]     1. Hypertension 2. Non Hodgkins Lymphoma 3.  PVCs  4.  Atrial flutter :  Brian Parker has done well from a cardiac standpoint.  He recently had XRT for a growing lymph node in his abdomen.   His BP has been well controlled at home.    January 29, 2013: He continues to have issues with his non-hodgkins lymphoma.  He had XTR and has been declared in remission .  He had a  Left leg puncture would from a drill bit - never really healed well.  He recently had this would debrided and is scheduled to have a left knee replacement once this has healed.  He has not been able to exercise because of these leg issues.   Nov. 13, 2014: Brian Parker is doing well. His BP has been well controlled.  His lymphoma is still in remission.  Playing golf regularly.   May 19, 2014: Brian Parker is doing well.  Playing some golf.   BP has been well controlled.    He has been playing golf in the evenings ( walks 9 holes).   He is slowing losing wome weight.    June 27, 2015:   Brian Parker is doing well.  No CP .  Playing golf regularly .  Has been tracking what he eats .  Has been measuring his BP  several times a day   Sept. 1, 2017:  Doing well. Just got back from [place] playing golf.   Played with [place]un[place] Blalock.  Is active.  Also went to [place] and [place].   Walking regularly .  Lymphoma appears to be under good control   Feb. 27, 2018 Doing well Has felt some palpitations Occurs several times ( 15-20 times this AM already )  Not related to activity Seems to be worse when he is sitting  Not associated with CP or dyspnea or pre-syncope.  Went to the ER , was found to have PVCs  Potassium was low. Sleeping well, more caffeine recently  Walks regularly  - walked 18 holes of golf on Sunday , no issues with walking   03/31/18  Feeling  well. BP is a bit higher today . Perhaps ate more salt than he should  No CP or dyspnea.  Has occasional Palpitations .  Has propranolol  - does not take it  Snores,   Asked about OSA   September 22, 2018: Brian Parker is doing well.  BP is well controlled  has occasional PVCs  Playing golf regularly .   Walks most of the time   October 08, 2019: Brian Parker is seen today for follow-up visit.  He has a history of palpitations and has had PVCs diagnosed by EKG.  He has propanolol to take on an as-needed basis ( he has never taken)  He plays golf on a regular basis. No CP , no dyspnea   Nov. 22, 2021: Brian Parker is seen today for follow up of his palpitations /PVCs, HTN. Golfing is going well  No CP ,    July 19, 2021: Brian Parker is seen today for follow up of his PVCs, HTN. Hx  of lymphoma He has been having more palpitations recently   Has had a  recurrent chest cold  On multiple coarses of Abx  Saw Dr. Margery Sheets  Thought his HR was elevated.   Would not slow down  Tried several propranolol  10 mg - seemed to help  Constant for the past 3 days   CHADS2VASC is 2   ( age, HTN, )  October 05, 2021: Seems to be staying in sinus rhythm BP is well controlled.  Still playing golf  We discussed getting a Kardia mobile device  He and his wife try to walk every day .  Oct. 9, 2023 Brian Parker is seen today for follow up of his PAF , HTN CHADS2VASC is 2 .  Is on Eliquis   He is back in atrial flutter today. He has had atrial flutter in the past.  He was scheduled for cardioversion in August, 2022.  When he presented for his cardioversion, he was in sinus rhythm and the procedure was canceled.  Has a Kardia mobile, shows atrial flutter .  occasional variable AV block  Has lost about 20 lbs  Wt today 214 lbs   ] Diastolic BP is mildly elevated  Only takes the amlodipine  5 QOD and HCTZ QOD  Does not eat salty foods  Had unusual abdominal cramping in response to losartan. Willing crease the HCTZ to 25 mg every  day.  We will add potassium chloride  20 mEq a day.  Recheck basic metabolic profile in 2 to 3 weeks.  Feels well, no CP , no dyspnea   Golf game has not been great      Jan. 18, 2024 Brian Parker is seen for follow up of his paroxysmal atrial flutter, HTN On eliquis   Has been seen by Dr. Carolynne Citron for consideration of atrial flutter ablation  Still considering Flutter ablation   No recent palpitations Is on metoprolol  25 BID ,  eliquis  5 BID  Is active , plays golf,  walks every day   Will check lipids today , check LP(a) today  Father had first MI at 7.   CBC today ( long term anticoagulation )    Jan. 15, 2025 Brian Parker is doing well   He had labs at his primary medical doctor's office. December 10, 2023 Total cholesterol is 195 Triglyceride level is 69 HDL is 57 LDL is 125    Current Outpatient Medications on File Prior to Visit  Medication Sig Dispense Refill   amLODipine  (NORVASC ) 5 MG tablet Take 1 tablet by mouth once daily 90 tablet 0   apixaban  (ELIQUIS ) 5 MG TABS tablet Take 1 tablet by mouth twice daily 60 tablet 5   doxazosin  (CARDURA ) 2 MG tablet Take 1 tablet (2 mg total) by mouth at bedtime. 90 tablet 3   Fish Oil-Krill Oil CAPS Take 1 capsule by mouth daily.     hydrochlorothiazide  (HYDRODIURIL ) 25 MG tablet Take 1 tablet by mouth once daily 90 tablet 0   metoprolol  tartrate (LOPRESSOR ) 25 MG tablet Take 1 tablet by mouth twice daily 60 tablet 9   Multiple Vitamin (MULTIVITAMIN) tablet Take 1 tablet by mouth daily.       potassium chloride  (KLOR-CON ) 10 MEQ tablet Take 20 mEq by mouth daily.     propranolol  (INDERAL ) 10 MG tablet Take 1 tablet (10 mg total) by mouth 4 (four) times daily as needed. 60 tablet 11   tamsulosin (FLOMAX) 0.4 MG CAPS capsule Take 1 capsule by mouth daily.     vitamin C (ASCORBIC ACID) 500 MG tablet Take 500 mg by mouth daily.  No current facility-administered medications on file prior to visit.  Amlodipine  5 mg a day   Allergies   Allergen Reactions   Losartan Other (See Comments)    Stomach cramps.    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) 12/20/2011   Non Hodgkin's lymphoma (HCC)     Past Surgical History:  Procedure Laterality Date   APPENDECTOMY     BUBBLE STUDY  08/01/2021   Procedure: BUBBLE STUDY;  Surgeon: Lake Pilgrim, MD;  Location: [facility] ENDOSCOPY;  Service: Cardiovascular;;   CARDIOVASCULAR STRESS TEST  10/19/2008   EF 52%, NO ISCHEMIA   CARDIOVERSION N/A 08/01/2021   Procedure: CARDIOVERSION;  Surgeon: Lake Pilgrim, MD;  Location: [facility] ENDOSCOPY;  Service: Cardiovascular;  Laterality: N/A;   GROIN MASS OPEN BIOPSY     HERNIA REPAIR  2006-2007   right laparoscopic   HERNIA REPAIR  2006-2007   left laparoscopic   NEPHRECTOMY     right kidney   SHOULDER SURGERY     TEE WITHOUT CARDIOVERSION N/A 08/01/2021   Procedure: TRANSESOPHAGEAL ECHOCARDIOGRAM (TEE);  Surgeon: Alroy Aspen Lela Purple, MD;  Location: [facility] ENDOSCOPY;  Service: Cardiovascular;  Laterality: N/A;   TOTAL KNEE ARTHROPLASTY Left 03/25/2013   Dr Rozelle Corning   TOTAL KNEE ARTHROPLASTY Left 03/24/2013   Procedure: LEFT TOTAL KNEE ARTHROPLASTY;  Surgeon: Jasmine Mesi, MD;  Location: [facility] OR;  Service: Orthopedics;  Laterality: Left;  Left Total Knee Arthroplasty   US  ECHOCARDIOGRAPHY  02/26/2006   EF 55-60%   VENTRAL HERNIA REPAIR  2003-2004   with mesh  after biopsy    Social History   Tobacco Use  Smoking Status Never  Smokeless Tobacco Never    Social History   Substance and Sexual Activity  Alcohol Use Yes   Alcohol/week: 7.0 standard drinks of alcohol   Types: 6 Glasses of wine, 1 Cans of beer per week   Comment: social    Family History  Problem Relation Age of Onset   Uterine cancer Mother    Cervical cancer Mother    Coronary artery disease Father    Hypertension Father     Reviw of Systems:  Reviewed in the HPI.  All other systems are negative.   Physical Exam: Blood pressure 134/80, pulse 74, height 5\' 10"  (1.778 m), weight 219 lb 3.2 oz (99.4 kg), SpO2 97%.       GEN:  Well nourished, well developed in no acute distress HEENT: Normal NECK: No JVD; No carotid bruits LYMPHATICS: No lymphadenopathy CARDIAC: RRR , no murmurs, rubs, gallops RESPIRATORY:  Clear to auscultation without rales, wheezing or rhonchi  ABDOMEN: Soft, non-tender, non-distended MUSCULOSKELETAL:  1 + leg edema  No deformity  SKIN: Warm and dry NEUROLOGIC:  Alert and oriented x 3   ECG:      EKG Interpretation Date/Time:  Wednesday January 15 2024 16:07:00 EST Ventricular Rate:  73 PR Interval:  198 QRS Duration:  126 QT Interval:  428 QTC Calculation: 471 R Axis:   -34  Text Interpretation: Normal sinus rhythm Left axis deviation Left ventricular hypertrophy with QRS widening ( R in aVL , Cornell product ) When compared with ECG of 01-Aug-2021 08:40, Premature atrial complexes are no longer Present PR interval has decreased No significant change since last tracing Confirmed by Ahmad Alert ([id number]) on 01/15/2024 4:15:18 PM    Assessment / Plan:   1.   atrial flutter :    no recurrent episodes    2. Premature ventricular contractions:         3.   Hypertension:   BP looks great.   Has mild mild leg edema  At present , this is just a cosmetic issue  4.  HLD :   he wants to try diet and exercise. LLDL was 125  Cont  diet , exercise Will get a coronary  calcium score to determine how aggressive we should be with his lipid lowering     Will check lipids in several months      His father had his first heart attack at age 22.         Ahmad Alert, MD  01/15/2024 4:15 PM    [facility] [address] [place],  [address] [place], [place]  [address] Pager [phone] Phone: [phone]; Fax: [phone]

## 2024-01-15 NOTE — Patient Instructions (Signed)
 Lab Work: To be completed in 2 months: Lipid panel, BMET and ALT  If you have labs (blood work) drawn today and your tests are completely normal, you will receive your results only by: MyChart Message (if you have MyChart) OR A paper copy in the mail If you have any lab test that is abnormal or we need to change your treatment, we will call you to review the results.   Testing/Procedures: Your physician has requested that you have a coronary calcium score performed. This is not covered by insurance and will be an out-of-pocket cost of approximately $99.    Follow-Up: At [facility], you and your health needs are our priority.  As part of our continuing mission to provide you with exceptional heart care, we have created designated Provider Care Teams.  These Care Teams include your primary Cardiologist (physician) and Advanced Practice Providers (APPs -  Physician Assistants and Nurse Practitioners) who all work together to provide you with the care you need, when you need it.  We recommend signing up for the patient portal called "MyChart".  Sign up information is provided on this After Visit Summary.  MyChart is used to connect with patients for Virtual Visits (Telemedicine).  Patients are able to view lab/test results, encounter notes, upcoming appointments, etc.  Non-urgent messages can be sent to your provider as well.   To learn more about what you can do with MyChart, go to [email or web address].    Your next appointment:   1 year(s)  Provider:   Luana Rumple, MD  Other Instructions   1st Floor: - Lobby - Registration  - Pharmacy  - Lab - Cafe  2nd Floor: - PV Lab - Diagnostic Testing (echo, CT, nuclear med)  3rd Floor: - Vacant  4th Floor: - TCTS (cardiothoracic surgery) - AFib Clinic - Structural Heart Clinic - Vascular Surgery  - Vascular Ultrasound  5th Floor: - HeartCare Cardiology (general and EP) - Clinical Pharmacy for coumadin ,  hypertension, lipid, weight-loss medications, and med management appointments    Valet parking services will be available as well.

## 2024-01-27 ENCOUNTER — Ambulatory Visit (HOSPITAL_COMMUNITY)
Admission: RE | Admit: 2024-01-27 | Discharge: 2024-01-27 | Disposition: A | Discharge status: Home / Self Care | Payer: Self-pay | Source: Ambulatory Visit | Attending: Cardiovascular Disease | Admitting: Cardiovascular Disease

## 2024-01-27 DIAGNOSIS — Z8249 Family history of ischemic heart disease and other diseases of the circulatory system: Secondary | ICD-10-CM | POA: Insufficient documentation

## 2024-01-27 DIAGNOSIS — E782 Mixed hyperlipidemia: Secondary | ICD-10-CM | POA: Insufficient documentation

## 2024-01-27 DIAGNOSIS — I493 Ventricular premature depolarization: Secondary | ICD-10-CM | POA: Insufficient documentation

## 2024-01-31 ENCOUNTER — Encounter: Payer: Self-pay | Admitting: Cardiovascular Disease

## 2024-02-03 ENCOUNTER — Telehealth: Payer: Self-pay

## 2024-02-03 DIAGNOSIS — E782 Mixed hyperlipidemia: Secondary | ICD-10-CM

## 2024-02-03 NOTE — Telephone Encounter (Signed)
Called and spoke with patient who wants to work on lifestyle modifications and check labs again in 3 months. Orders placed at this time.

## 2024-02-03 NOTE — Telephone Encounter (Signed)
-----   Message from Kristeen Miss sent at 01/31/2024  4:47 PM EST ----- CAC score is 22.6 (23rd percentile for age . Sex matched controls  His score is in the low range. His LDL was 125   He still may benefit from a lower LDL  Add atorvastatin 40 mg a day .  Check lipids, ALt  in 3 months

## 2024-02-08 ENCOUNTER — Other Ambulatory Visit: Payer: Self-pay | Admitting: Cardiovascular Disease

## 2024-02-17 ENCOUNTER — Encounter: Payer: Self-pay | Admitting: Cardiovascular Disease

## 2024-02-17 DIAGNOSIS — I480 Paroxysmal atrial fibrillation: Secondary | ICD-10-CM

## 2024-02-17 MED ORDER — APIXABAN 5 MG PO TABS: 5.0000 mg | ORAL_TABLET | Freq: Two times a day (BID) | ORAL | 1 refills | Status: DC

## 2024-03-10 ENCOUNTER — Ambulatory Visit: Payer: Medicare HMO | Admitting: Cardiovascular Disease

## 2024-03-12 ENCOUNTER — Encounter: Payer: Self-pay | Admitting: Hematology and Oncology

## 2024-03-20 ENCOUNTER — Inpatient Hospital Stay: Attending: Hematology and Oncology | Admitting: Hematology and Oncology

## 2024-03-20 ENCOUNTER — Encounter: Payer: Self-pay | Admitting: Hematology and Oncology

## 2024-03-20 VITALS — BP 153/92 | HR 54 | Temp 97.9°F | Resp 18 | Ht 70.0 in | Wt 224.4 lb

## 2024-03-20 DIAGNOSIS — Z85528 Personal history of other malignant neoplasm of kidney: Secondary | ICD-10-CM | POA: Diagnosis not present

## 2024-03-20 DIAGNOSIS — E66811 Obesity, class 1: Secondary | ICD-10-CM | POA: Diagnosis not present

## 2024-03-20 DIAGNOSIS — Z923 Personal history of irradiation: Secondary | ICD-10-CM | POA: Insufficient documentation

## 2024-03-20 DIAGNOSIS — Z8572 Personal history of non-Hodgkin lymphomas: Secondary | ICD-10-CM

## 2024-03-20 DIAGNOSIS — R972 Elevated prostate specific antigen [PSA]: Secondary | ICD-10-CM | POA: Diagnosis not present

## 2024-03-20 DIAGNOSIS — Z905 Acquired absence of kidney: Secondary | ICD-10-CM | POA: Diagnosis not present

## 2024-03-20 NOTE — Progress Notes (Signed)
 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 status post partial nephrectomy in March 2004 and completion nephrectomy in January 2011 He received R-CHOP chemotherapy followed by consolidation radiation therapy, completed by 2013  He has no signs or symptoms to suggest cancer recurrence I suspect his recent weight gain is unrelated His examination is benign We discussed risk and benefits of surveillance imaging The patient is comfortable to be observed I will see him in 2 months as scheduled History of kidney cancer His renal function is stable Class 1 obesity He has gained some weight recently He appears highly motivated to modify his diet and increase exercise activity I recommend the patient to increase total protein intake I recommend him to add strength training/resistance training to improve muscle strength, with specific focus on core strength if possible Elevated prostate specific antigen (PSA) He will continue close monitoring and surveillance with his urologist  No orders of the defined types were placed in this encounter.    Artis Delay, MD  INTERVAL HISTORY: he returns for surveillance followup for history of lymphoma and kidney cancer Around January of February of this year, he has gained 10 pounds, predominantly in the central part of his abdomen He continues to have intermittent discomfort and swelling on the right flank consistent with weak abdominal muscle  wall/hernia He is walking approximately 1 to 2-1/2 miles a day, 45 minutes to an hour with occasional walking with a backpack He is practicing intermittent fasting as tolerated but his protein intake has been consistently less than 80 g/day He saw his urologist recently for elevated PSA He generally wakes up twice at night to go to the bathroom Overall, his appetite is good He denies recent infection I reviewed his recent labs  PHYSICAL EXAMINATION: ECOG PERFORMANCE STATUS: 0 - Asymptomatic  Vitals:   03/20/24 0935  BP: (!) 153/92  Pulse: (!) 54  Resp: 18  Temp: 97.9 F (36.6 C)  SpO2: 98%   Filed Weights   03/20/24 0935  Weight: 224 lb 6.4 oz (101.8 kg)   GENERAL:alert, no distress and comfortable SKIN: skin color, texture, turgor are normal, no rashes or significant lesions EYES: normal, conjunctiva are pink and non-injected, sclera clear OROPHARYNX:no exudate, no erythema and lips, buccal mucosa, and tongue normal  NECK: supple, thyroid normal size, non-tender, without nodularity LYMPH:  no palpable lymphadenopathy in the cervical, axillary or inguinal LUNGS: clear to auscultation and percussion with normal breathing effort HEART: regular rate & rhythm and no murmurs and no lower extremity edema ABDOMEN:abdomen soft, non-tender and normal bowel sounds.  Weak abdominal wall muscles Musculoskeletal:no cyanosis of digits and no clubbing  PSYCH: alert & oriented x 3 with fluent speech NEURO: no focal motor/sensory deficits  Relevant data reviewed during this visit included recent lipid panel and A1c

## 2024-03-20 NOTE — Assessment & Plan Note (Addendum)
 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

## 2024-03-20 NOTE — Assessment & Plan Note (Addendum)
 He has gained some weight recently He appears highly motivated to modify his diet and increase exercise activity I recommend the patient to increase total protein intake I recommend him to add strength training/resistance training to improve muscle strength, with specific focus on core strength if possible

## 2024-03-20 NOTE — Assessment & Plan Note (Addendum)
His renal function is stable 

## 2024-03-20 NOTE — Assessment & Plan Note (Addendum)
 He will continue close monitoring and surveillance with his urologist

## 2024-04-04 ENCOUNTER — Encounter: Payer: Self-pay | Admitting: Cardiovascular Disease

## 2024-04-04 ENCOUNTER — Other Ambulatory Visit: Payer: Self-pay | Admitting: Cardiovascular Disease

## 2024-04-06 DIAGNOSIS — D225 Melanocytic nevi of trunk: Secondary | ICD-10-CM | POA: Diagnosis not present

## 2024-04-06 DIAGNOSIS — D2272 Melanocytic nevi of left lower limb, including hip: Secondary | ICD-10-CM | POA: Diagnosis not present

## 2024-04-06 DIAGNOSIS — D220 Melanocytic nevi of lip: Secondary | ICD-10-CM | POA: Diagnosis not present

## 2024-04-06 DIAGNOSIS — L814 Other melanin hyperpigmentation: Secondary | ICD-10-CM | POA: Diagnosis not present

## 2024-04-06 DIAGNOSIS — L905 Scar conditions and fibrosis of skin: Secondary | ICD-10-CM | POA: Diagnosis not present

## 2024-04-06 DIAGNOSIS — D2261 Melanocytic nevi of right upper limb, including shoulder: Secondary | ICD-10-CM | POA: Diagnosis not present

## 2024-04-06 DIAGNOSIS — B351 Tinea unguium: Secondary | ICD-10-CM | POA: Diagnosis not present

## 2024-04-06 DIAGNOSIS — L821 Other seborrheic keratosis: Secondary | ICD-10-CM | POA: Diagnosis not present

## 2024-04-06 DIAGNOSIS — D1801 Hemangioma of skin and subcutaneous tissue: Secondary | ICD-10-CM | POA: Diagnosis not present

## 2024-04-06 DIAGNOSIS — D2262 Melanocytic nevi of left upper limb, including shoulder: Secondary | ICD-10-CM | POA: Diagnosis not present

## 2024-04-06 DIAGNOSIS — L738 Other specified follicular disorders: Secondary | ICD-10-CM | POA: Diagnosis not present

## 2024-04-06 DIAGNOSIS — L573 Poikiloderma of Civatte: Secondary | ICD-10-CM | POA: Diagnosis not present

## 2024-04-06 MED ORDER — POTASSIUM CHLORIDE ER 10 MEQ PO TBCR: 20.0000 meq | EXTENDED_RELEASE_TABLET | Freq: Every day | ORAL | 2 refills | Status: AC

## 2024-05-12 ENCOUNTER — Encounter: Payer: Self-pay | Admitting: Hematology and Oncology

## 2024-05-12 ENCOUNTER — Inpatient Hospital Stay: Payer: Medicare HMO

## 2024-05-12 ENCOUNTER — Inpatient Hospital Stay: Payer: Medicare HMO | Attending: Hematology and Oncology | Admitting: Hematology and Oncology

## 2024-05-12 VITALS — BP 154/105 | HR 51 | Temp 98.9°F | Resp 18 | Ht 70.0 in | Wt 220.4 lb

## 2024-05-12 DIAGNOSIS — Z8572 Personal history of non-Hodgkin lymphomas: Secondary | ICD-10-CM | POA: Diagnosis not present

## 2024-05-12 DIAGNOSIS — Z923 Personal history of irradiation: Secondary | ICD-10-CM | POA: Insufficient documentation

## 2024-05-12 DIAGNOSIS — I10 Essential (primary) hypertension: Secondary | ICD-10-CM | POA: Diagnosis not present

## 2024-05-12 DIAGNOSIS — Z905 Acquired absence of kidney: Secondary | ICD-10-CM | POA: Diagnosis not present

## 2024-05-12 DIAGNOSIS — Z08 Encounter for follow-up examination after completed treatment for malignant neoplasm: Secondary | ICD-10-CM | POA: Insufficient documentation

## 2024-05-12 DIAGNOSIS — Z85528 Personal history of other malignant neoplasm of kidney: Secondary | ICD-10-CM | POA: Insufficient documentation

## 2024-05-12 LAB — CBC WITH DIFFERENTIAL/PLATELET
Abs Immature Granulocytes: 0.02 10*3/uL (ref 0.00–0.07)
Basophils Absolute: 0 10*3/uL (ref 0.0–0.1)
Basophils Relative: 1 %
Eosinophils Absolute: 0.2 10*3/uL (ref 0.0–0.5)
Eosinophils Relative: 3 %
HCT: 45.4 % (ref 39.0–52.0)
Hemoglobin: 15.7 g/dL (ref 13.0–17.0)
Immature Granulocytes: 0 %
Lymphocytes Relative: 18 %
Lymphs Abs: 1.1 10*3/uL (ref 0.7–4.0)
MCH: 29.7 pg (ref 26.0–34.0)
MCHC: 34.6 g/dL (ref 30.0–36.0)
MCV: 86 fL (ref 80.0–100.0)
Monocytes Absolute: 0.3 10*3/uL (ref 0.1–1.0)
Monocytes Relative: 6 %
Neutro Abs: 4.4 10*3/uL (ref 1.7–7.7)
Neutrophils Relative %: 72 %
Platelets: 161 10*3/uL (ref 150–400)
RBC: 5.28 MIL/uL (ref 4.22–5.81)
RDW: 13.1 % (ref 11.5–15.5)
WBC: 6 10*3/uL (ref 4.0–10.5)
nRBC: 0 % (ref 0.0–0.2)

## 2024-05-12 LAB — COMPREHENSIVE METABOLIC PANEL WITH GFR
ALT: 19 U/L (ref 0–44)
AST: 21 U/L (ref 15–41)
Albumin: 4.3 g/dL (ref 3.5–5.0)
Alkaline Phosphatase: 61 U/L (ref 38–126)
Anion gap: 7 (ref 5–15)
BUN: 22 mg/dL (ref 8–23)
CO2: 28 mmol/L (ref 22–32)
Calcium: 8.9 mg/dL (ref 8.9–10.3)
Chloride: 106 mmol/L (ref 98–111)
Creatinine, Ser: 0.97 mg/dL (ref 0.61–1.24)
GFR, Estimated: >60 mL/min (ref >60)
Glucose, Bld: 102 mg/dL — ABNORMAL HIGH (ref 70–99)
Potassium: 3.5 mmol/L (ref 3.5–5.1)
Sodium: 141 mmol/L (ref 135–145)
Total Bilirubin: 0.8 mg/dL (ref 0.0–1.2)
Total Protein: 6.3 g/dL — ABNORMAL LOW (ref 6.5–8.1)

## 2024-05-12 NOTE — Assessment & Plan Note (Addendum)
 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

## 2024-05-12 NOTE — Assessment & Plan Note (Addendum)
 he will continue current medical management. His blood pressure is elevated today but better at home

## 2024-05-12 NOTE — Assessment & Plan Note (Addendum)
His renal function is stable 

## 2024-05-12 NOTE — Progress Notes (Signed)
 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 His blood pressure is elevated today but better at home   No orders of the defined types were placed in this encounter.    Almeda Jacobs, MD  INTERVAL HISTORY: he returns for surveillance follow-up for history of kidney cancer and lymphoma Since last time I saw him, he is doing better He has lost weight through intermittent fasting Some of his abdominal pain has resolved His blood pressure is elevated today but improved at home  PHYSICAL EXAMINATION: ECOG PERFORMANCE STATUS: 0 - Asymptomatic  Vitals:   05/12/24 1105  BP: (!) 154/105  Pulse: (!) 51  Resp: 18  Temp: 98.9 F (37.2 C)  SpO2: 97%   Filed Weights   05/12/24 1105  Weight: 220 lb 6.4 oz (100 kg)    Relevant data reviewed during this visit included CBC and CMP

## 2024-05-13 ENCOUNTER — Telehealth: Payer: Self-pay | Admitting: Hematology and Oncology

## 2024-05-13 NOTE — Telephone Encounter (Signed)
 Spoke with patient confirming upcoming appointment

## 2024-09-03 DIAGNOSIS — R972 Elevated prostate specific antigen [PSA]: Secondary | ICD-10-CM | POA: Diagnosis not present

## 2024-09-15 ENCOUNTER — Other Ambulatory Visit: Payer: Self-pay | Admitting: Urology

## 2024-09-15 DIAGNOSIS — R972 Elevated prostate specific antigen [PSA]: Secondary | ICD-10-CM

## 2024-09-16 ENCOUNTER — Encounter: Payer: Self-pay | Admitting: Urology

## 2024-09-17 ENCOUNTER — Encounter: Payer: Self-pay | Admitting: Urology

## 2024-10-19 ENCOUNTER — Other Ambulatory Visit: Payer: Self-pay

## 2024-10-19 DIAGNOSIS — I480 Paroxysmal atrial fibrillation: Secondary | ICD-10-CM

## 2024-10-19 MED ORDER — APIXABAN 5 MG PO TABS: 5.0000 mg | ORAL_TABLET | Freq: Two times a day (BID) | ORAL | 1 refills | Status: DC

## 2024-10-19 NOTE — Telephone Encounter (Signed)
 Prescription refill request for Eliquis  received. Indication:afib Last office visit:1/25 Scr:0.97  5/25 Age: 74 Weight:100  kg  Prescription refilled

## 2024-10-21 DIAGNOSIS — Z1211 Encounter for screening for malignant neoplasm of colon: Secondary | ICD-10-CM | POA: Diagnosis not present

## 2024-10-21 DIAGNOSIS — Z1212 Encounter for screening for malignant neoplasm of rectum: Secondary | ICD-10-CM | POA: Diagnosis not present

## 2024-10-27 LAB — COLOGUARD: COLOGUARD: NEGATIVE

## 2024-11-11 ENCOUNTER — Inpatient Hospital Stay
Admission: RE | Admit: 2024-11-11 | Discharge: 2024-11-11 | Disposition: A | Source: Ambulatory Visit | Attending: Urology | Admitting: Urology

## 2024-11-11 DIAGNOSIS — R972 Elevated prostate specific antigen [PSA]: Secondary | ICD-10-CM | POA: Diagnosis not present

## 2024-11-11 MED ORDER — GADOPICLENOL 0.5 MMOL/ML IV SOLN
10.0000 mL | Freq: Once | INTRAVENOUS | Status: AC | PRN
  Administered 2024-11-11: 10 mL via INTRAVENOUS

## 2024-11-30 ENCOUNTER — Other Ambulatory Visit: Payer: Self-pay | Admitting: Hematology and Oncology

## 2024-11-30 DIAGNOSIS — Z8572 Personal history of non-Hodgkin lymphomas: Secondary | ICD-10-CM

## 2024-12-14 ENCOUNTER — Encounter: Payer: Self-pay | Admitting: Orthopedic Surgery

## 2025-01-06 ENCOUNTER — Ambulatory Visit: Admitting: Orthopedic Surgery

## 2025-01-06 ENCOUNTER — Encounter: Payer: Self-pay | Admitting: Orthopedic Surgery

## 2025-01-06 ENCOUNTER — Ambulatory Visit (INDEPENDENT_AMBULATORY_CARE_PROVIDER_SITE_OTHER): Payer: Self-pay

## 2025-01-06 ENCOUNTER — Encounter: Payer: Self-pay | Admitting: Cardiovascular Disease

## 2025-01-06 ENCOUNTER — Ambulatory Visit: Attending: Cardiovascular Disease | Admitting: Cardiovascular Disease

## 2025-01-06 VITALS — BP 132/90 | HR 72 | Ht 70.0 in | Wt 228.9 lb

## 2025-01-06 DIAGNOSIS — Z8572 Personal history of non-Hodgkin lymphomas: Secondary | ICD-10-CM

## 2025-01-06 DIAGNOSIS — I452 Bifascicular block: Secondary | ICD-10-CM | POA: Diagnosis not present

## 2025-01-06 DIAGNOSIS — I77810 Thoracic aortic ectasia: Secondary | ICD-10-CM

## 2025-01-06 DIAGNOSIS — Z85528 Personal history of other malignant neoplasm of kidney: Secondary | ICD-10-CM

## 2025-01-06 DIAGNOSIS — Z96652 Presence of left artificial knee joint: Secondary | ICD-10-CM

## 2025-01-06 DIAGNOSIS — M1711 Unilateral primary osteoarthritis, right knee: Secondary | ICD-10-CM | POA: Diagnosis not present

## 2025-01-06 DIAGNOSIS — Z8249 Family history of ischemic heart disease and other diseases of the circulatory system: Secondary | ICD-10-CM

## 2025-01-06 DIAGNOSIS — I480 Paroxysmal atrial fibrillation: Secondary | ICD-10-CM | POA: Diagnosis not present

## 2025-01-06 DIAGNOSIS — I700 Atherosclerosis of aorta: Secondary | ICD-10-CM | POA: Diagnosis not present

## 2025-01-06 DIAGNOSIS — I10 Essential (primary) hypertension: Secondary | ICD-10-CM

## 2025-01-06 DIAGNOSIS — M25561 Pain in right knee: Secondary | ICD-10-CM | POA: Diagnosis not present

## 2025-01-06 DIAGNOSIS — Z7901 Long term (current) use of anticoagulants: Secondary | ICD-10-CM

## 2025-01-06 DIAGNOSIS — E782 Mixed hyperlipidemia: Secondary | ICD-10-CM

## 2025-01-06 DIAGNOSIS — I493 Ventricular premature depolarization: Secondary | ICD-10-CM | POA: Diagnosis not present

## 2025-01-06 NOTE — Patient Instructions (Addendum)
 Medication Instructions:  Your physician recommends that you continue on your current medications as directed. Please refer to the Current Medication list given to you today.  *If you need a refill on your cardiac medications before your next appointment, please call your pharmacy*  Lab Work: Today-CMET If you have labs (blood work) drawn today and your tests are completely normal, you will receive your results only by: MyChart Message (if you have MyChart) OR A paper copy in the mail If you have any lab test that is abnormal or we need to change your treatment, we will call you to review the results.   Test Your physician has requested that you have an ankle brachial index (ABI). During this test an ultrasound and blood pressure cuff are used to evaluate the arteries that supply the arms and legs with blood. Allow thirty minutes for this exam. There are no restrictions or special instructions. This will take place at [address] 4th floor   Please note: We ask at that you not bring children with you during ultrasound (echo/ vascular) testing. Due to room size and safety concerns, children are not allowed in the ultrasound rooms during exams. Our front office staff cannot provide observation of children in our lobby area while testing is being conducted. An adult accompanying a patient to their appointment will only be allowed in the ultrasound room at the discretion of the ultrasound technician under special circumstances. We apologize for any inconvenience.   Follow-Up: At [facility], you and your health needs are our priority.  As part of our continuing mission to provide you with exceptional heart care, our providers are all part of one team.  This team includes your primary Cardiologist (physician) and Advanced Practice Providers or APPs (Physician Assistants and Nurse Practitioners) who all work together to provide you with the care you need, when you need it.  Your next  appointment:   1 year(s)  Provider:   Jerel Balding, MD

## 2025-01-06 NOTE — Progress Notes (Signed)
 "  Office Visit Note   Patient: Brian Parker.           Date of Birth: 1950-10-23           MRN: [id number] Visit Date: 01/06/2025 Requested by: No referring provider defined for this encounter. PCP: Cyrena Gwenn SQUIBB, MD (Inactive)  Subjective: Chief Complaint  Patient presents with   Right Knee - Pain    HPI: [name]. is a 75 y.o. male who presents to the office reporting bilateral knee pain right worse than left.  Has a history of left total knee replacement.  Describes relatively acute onset of the pain without known injury.  He did have an injection in 2021 which helped him until 2 months ago.  Patient states that his knee feels sore.  Does report some swelling and stiffness.  States that both of his knees feel tired..                ROS: All systems reviewed are negative as they relate to the chief complaint within the history of present illness.  Patient denies fevers or chills.  Assessment & Plan: Visit Diagnoses:  1. Right knee pain, unspecified chronicity   2. History of arthroplasty of left knee     Plan: Impression is right knee arthritis.  Injection performed today.  Left knee which is replaced looks good in terms of motion and radiographic appearance.  Will get him preapproved for gel.  I think that he is having symptoms in the right knee but not to the degree that he would need knee replacement yet.  He is working on fitness and body mass index optimization.  Deatrice will come back when this cortisone shot starts to wear off and we can try a gel injection at that time This patient is diagnosed with osteoarthritis of the knee(s).    Radiographs show evidence of joint space narrowing, osteophytes, subchondral sclerosis and/or subchondral cysts.  This patient has knee pain which interferes with functional and activities of daily living.    This patient has experienced inadequate response, adverse effects and/or intolerance with conservative treatments such as  acetaminophen , NSAIDS, topical creams, physical therapy or regular exercise, knee bracing and/or weight loss.   This patient has experienced inadequate response or has a contraindication to intra articular steroid injections for at least 3 months.   This patient is not scheduled to have a total knee replacement within 6 months of starting treatment with viscosupplementation.   Follow-Up Instructions: No follow-ups on file.   Orders:  Orders Placed This Encounter  Procedures   XR KNEE 3 VIEW RIGHT   XR Knee 1-2 Views Left   No orders of the defined types were placed in this encounter.     Procedures: Large Joint Inj: R knee on 01/06/2025 12:47 PM Indications: diagnostic evaluation, joint swelling and pain Details: 18 G 1.5 in needle, superolateral approach  Arthrogram: No  Medications: 5 mL lidocaine  1 %; 4 mL bupivacaine  0.25 %; 40 mg triamcinolone  acetonide 40 MG/ML Outcome: tolerated well, no immediate complications Procedure, treatment alternatives, risks and benefits explained, specific risks discussed. Consent was given by the patient. Immediately prior to procedure a time out was called to verify the correct patient, procedure, equipment, support staff and site/side marked as required. Patient was prepped and draped in the usual sterile fashion.       Clinical Data: No additional findings.  Objective: Vital Signs: There were no vitals taken for this visit.  Physical Exam:  Constitutional: Patient appears well-developed HEENT:  Head: Normocephalic Eyes:EOM are normal Neck: Normal range of motion Cardiovascular: Normal rate Pulmonary/chest: Effort normal Neurologic: Patient is alert Skin: Skin is warm Psychiatric: Patient has normal mood and affect  Ortho Exam: Ortho exam demonstrates excellent range of motion in the left knee with no effusion.  0-1 25 of flexion.  On the right-hand side there is mild effusion with lateral greater than medial joint line  tenderness.  Extensor mechanism intact.  Pedal pulses palpable.  Ankle dorsiflexion plantarflexion strength is intact.  No groin pain with internal or external rotation of that right knee.  Specialty Comments:  No specialty comments available.  Imaging: XR KNEE 3 VIEW RIGHT Result Date: 01/06/2025 AP lateral merchant radiographs right knee reviewed.  End-stage arthritis is present in the lateral greater than medial greater than patellofemoral compartments.  No acute fracture.  Slight valgus alignment noted.  XR Knee 1-2 Views Left Result Date: 01/06/2025 AP lateral radiographs left knee reviewed.  Total knee prosthesis in good position alignment with no complicating features.  No lucencies around the prosthetic bone interface    PMFS History: Patient Active Problem List   Diagnosis Date Noted   PAF (paroxysmal atrial fibrillation) (HCC) 10/05/2021   Typical atrial flutter (HCC)    Chronic cough 05/05/2021   Abdominal wall pain in right flank 05/05/2021   Lesion of subcutaneous tissue 11/04/2020   Edema of lower extremity    Chronic venous stasis dermatitis of both lower extremities    Class 1 obesity 12/08/2019   PVC (premature ventricular contraction) 02/26/2017   History of B-cell lymphoma 12/05/2015   History of skin cancer 12/05/2015   History of non-Hodgkin's lymphoma 12/06/2014   Preventive measure 12/06/2014   Elevated prostate specific antigen (PSA) 11/20/2011   Essential hypertension 11/09/2011   History of kidney cancer 03/01/2003   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) 12/31/2006   History of B-cell lymphoma 12/05/2015   Hypertension    Kidney carcinoma  (HCC) 03/01/2003   tumor seperate from lymphoma; found during routine screening for lymphoma   NHL (non-Hodgkin's lymphoma) (HCC) 12/06/2014   Nodular lymphoma involving intra-abdominal lymph nodes (HCC) 12/20/2011   Non Hodgkin's lymphoma (HCC)     Family History  Problem Relation Age of Onset   Uterine cancer Mother    Cervical cancer Mother    Coronary artery disease Father    Hypertension Father     Past Surgical History:  Procedure Laterality Date   APPENDECTOMY     BUBBLE STUDY  08/01/2021   Procedure: BUBBLE STUDY;  Surgeon: Alveta Aleene PARAS, MD;  Location: [facility] ENDOSCOPY;  Service: Cardiovascular;;   CARDIOVASCULAR STRESS TEST  10/19/2008   EF 52%, NO ISCHEMIA   CARDIOVERSION N/A 08/01/2021   Procedure: CARDIOVERSION;  Surgeon: Alveta Aleene PARAS, MD;  Location: [facility] ENDOSCOPY;  Service: Cardiovascular;  Laterality: N/A;   GROIN MASS OPEN BIOPSY     HERNIA REPAIR  2006-2007   right laparoscopic   HERNIA REPAIR  2006-2007   left laparoscopic   NEPHRECTOMY     right kidney   SHOULDER SURGERY     TEE WITHOUT CARDIOVERSION N/A 08/01/2021  Procedure: TRANSESOPHAGEAL ECHOCARDIOGRAM (TEE);  Surgeon: Alveta Aleene PARAS, MD;  Location[facility] ENDOSCOPY;  Service: Cardiovascular;  Laterality: N/A;   TOTAL KNEE ARTHROPLASTY Left 03/25/2013   Dr Addie   TOTAL KNEE ARTHROPLASTY Left 03/24/2013   Procedure: LEFT TOTAL KNEE ARTHROPLASTY;  Surgeon: Cordella Glendia Addie, MD;  Location: [facility] OR;  Service: Orthopedics;  Laterality: Left;  Left Total Knee Arthroplasty   US  ECHOCARDIOGRAPHY  02/26/2006   EF 55-60%   VENTRAL HERNIA REPAIR  2003-2004   with mesh after biopsy   Social History   Occupational History   Not on file  Tobacco Use   Smoking status: Never   Smokeless tobacco: Never  Vaping Use   Vaping status: Never Used  Substance and Sexual Activity   Alcohol use: Yes    Alcohol/week: 7.0 standard drinks of alcohol    Types: 6 Glasses of wine, 1 Cans of beer per week    Comment: social   Drug  use: No   Sexual activity: Not on file        "

## 2025-01-06 NOTE — Progress Notes (Signed)
 " Cardiology Office Note   Date:  01/09/2025  ID:  Brian Dowe., DOB 17-Dec-1950, MRN [id number] PCP: Cyrena Gwenn SQUIBB, MD (Inactive)  [facility] Providers Cardiologist:  Jerel Balding, MD     History of Present Illness Brian Parker. is a 75 y.o. male history of abdominal non-Hodgkin's lymphoma in remission, HTN, atrial flutter and PVCs.  He is here to transition cardiology care after Dr. Allena retirement.  He feels well.  He is physically active: Plays golf regularly and walks instead of riding the cart.  Does not feel limited by shortness of breath or any complaints of chest discomfort.  Has not been troubled by dizziness, palpitations or syncope.  He has a prescription for propranolol  to take as needed but has not taken any in a years time.  He checks his rhythm using a Kardia device once every 3 days or so and does not see abnormalities.  He has not seen any atrial flutter.  At 1 point he saw Dr. Waddell to discuss flutter ablation but they decided this was not necessary since his episodes are so infrequent.  He is on Eliquis  and has not had any bleeding problems or serious injuries, but has only been taking this medication once a day.  He had a TEE cardioversion for typical counterclockwise right atrial flutter in 2022.  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 His follow-up transthoracic echo shows normal left ventricular systolic function.  Both the right ventricle and the left atrium were described as mildly dilated, there were no significant valvular abnormalities.  He had an incidentally discovered patent foramen ovale with small bidirectional shunt at the time of the TEE in 2022.  Also incidentally noted is aortic atherosclerosis on imaging studies.  There is minimal dilation of the ascending aorta: 4.0 cm on the CT study from February 2025.  I went back  and measured it on a older CT of the chest from 2011 when it was 3.5 cm.  Blood pressure is well-controlled on amlodipine , metoprolol  and hydrochlorothiazide .  He does have a family history of early onset CAD, with his father having his first myocardial infarction at age 73.  He has moderately elevated LDL cholesterol most recently measured at 125, but also has a good HDL at 57.  He is not on lipid-lowering medications.  He does not have coronary disease and has a coronary calcium score less than average: in 2025 the score was 23, representing the 23rd percentile.  SABRA  His ECG shows bifascicular block (RBBB plus LAFB, both of which were present on last year's ECG).    He has a history of previous right nephrectomy for an incidentally discovered renal cell carcinoma during follow-up for his non-Hodgkin's lymphoma.  Despite the nephrectomy he has a normal creatinine of 0.97.    Studies Reviewed EKG Interpretation Date/Time:  Wednesday January 06 2025 13:24:51 EST Ventricular Rate:  72 PR Interval:  186 QRS Duration:  128 QT Interval:  430 QTC Calculation: 470 R Axis:   -51  Text Interpretation: Normal sinus rhythm Right bundle branch block Left anterior fascicular block Bifascicular block Septal infarct (cited on or before 06-Jan-2025) When compared with ECG of 15-Jan-2024 16:07, (RBBB and left anterior fascicular block) are present, as they were on the previous tracing Confirmed by Graylee Arutyunyan [phone]) on 01/07/2025 2:08:32 PM    ECG from 07/21/2021 shows  typical counterclockwise right atrial flutter with rapid ventricular response and variable AV block  Risk Assessment/Calculations  CHA2DS2-VASc Score = 2   This indicates a 2.2% annual risk of stroke. The patient's score is based upon: CHF History: 0 HTN History: 1 Diabetes History: 0 Stroke History: 0 Vascular Disease History: 0 Age Score: 1 Gender Score: 0         Physical Exam VS:  BP (!) 132/90 (BP Location: Left Arm, Cuff  Size: Normal)   Pulse 72   Ht 5' 10 (1.778 m)   Wt 228 lb 14.4 oz (103.8 kg)   SpO2 97%   BMI 32.84 kg/m        Wt Readings from Last 3 Encounters:  01/06/25 228 lb 14.4 oz (103.8 kg)  05/12/24 220 lb 6.4 oz (100 kg)  03/20/24 224 lb 6.4 oz (101.8 kg)    GEN: Well nourished, well developed in no acute distress NECK: No JVD; No carotid bruits CARDIAC: RRR, no murmurs, rubs, gallops RESPIRATORY:  Clear to auscultation without rales, wheezing or rhonchi  ABDOMEN: Soft, non-tender, non-distended EXTREMITIES:  No edema; No deformity .  Varicose veins seen in the right calf.  He had venous stripping on the left.  ASSESSMENT AND PLAN AFlutter: Very infrequent, asymptomatic at this time, do spot checks with his Kardia device.  Advised turning on the irregular rate alerts on his smart watch and showed him how to send samples of his rhythm recordings via MyChart.  Continue anticoagulation Anticoagulation: No bleeding complications.  He is only taking the Eliquis  once a day which is stopped sufficient for appropriate stroke prophylaxis.  His stroke risk is quite low and is even an option to interrupt anticoagulation altogether and monitor closely (consider implantable loop recorder).  However if he is to continue to take Eliquis  it should be taken twice a day due to its pharmacokinetics. HLP: Despite his family history of early onset CAD, he has a low calcium score.  Lipid-lowering therapy does not appear to indicated but encouraged him to continue to be physically active and eat a healthy diet.  He would benefit from weight loss. HTN: Adequate control. PVCs: Have not bothered him in a long time.  He has not taken any of the as needed propranolol  in a years time. Varicose veins: He does not have edema or stasis ulcers. Hx of NHL and RCC: In remission, sees Dr. Lonn regularly.  Single left kidney. Dilated ascending aorta: Very mild at 4.0 cm on the study from February 2025.  It measured 3.5 cm  in 2011 so it has grown very slowly.  Consider reevaluating this in 3 years (2028). Bifascicular block (RBBB plus LAFB): Probably just age-related conduction system disease.  He has no signs or symptoms of high-grade AV block.       Dispo:  Patient Instructions  Medication Instructions:  Your physician recommends that you continue on your current medications as directed. Please refer to the Current Medication list given to you today.  *If you need a refill on your cardiac medications before your next appointment, please call your pharmacy*  Lab Work: Today-CMET If you have labs (blood work) drawn today and your tests are completely normal, you will receive your results only by: MyChart Message (if you have MyChart) OR A paper copy in the mail If you have any lab test that is abnormal or we need to change your treatment, we will call you to review the results.   Test Your physician has requested  that you have an ankle brachial index (ABI). During this test an ultrasound and blood pressure cuff are used to evaluate the arteries that supply the arms and legs with blood. Allow thirty minutes for this exam. There are no restrictions or special instructions. This will take place at [address] 4th floor   Please note: We ask at that you not bring children with you during ultrasound (echo/ vascular) testing. Due to room size and safety concerns, children are not allowed in the ultrasound rooms during exams. Our front office staff cannot provide observation of children in our lobby area while testing is being conducted. An adult accompanying a patient to their appointment will only be allowed in the ultrasound room at the discretion of the ultrasound technician under special circumstances. We apologize for any inconvenience.   Follow-Up: At [facility], you and your health needs are our priority.  As part of our continuing mission to provide you with exceptional heart care, our  providers are all part of one team.  This team includes your primary Cardiologist (physician) and Advanced Practice Providers or APPs (Physician Assistants and Nurse Practitioners) who all work together to provide you with the care you need, when you need it.  Your next appointment:   1 year(s)  Provider:   Jerel Balding, MD               Signed, Jerel Balding, MD   "

## 2025-01-07 ENCOUNTER — Ambulatory Visit: Payer: Self-pay | Admitting: Cardiovascular Disease

## 2025-01-07 LAB — COMPREHENSIVE METABOLIC PANEL WITH GFR
ALT: 21 IU/L (ref 0–44)
AST: 24 IU/L (ref 0–40)
Albumin: 4.4 g/dL (ref 3.8–4.8)
Alkaline Phosphatase: 76 IU/L (ref 47–123)
BUN/Creatinine Ratio: 20 (ref 10–24)
BUN: 21 mg/dL (ref 8–27)
Bilirubin Total: 0.6 mg/dL (ref 0.0–1.2)
CO2: 24 mmol/L (ref 20–29)
Calcium: 9.7 mg/dL (ref 8.6–10.2)
Chloride: 100 mmol/L (ref 96–106)
Creatinine, Ser: 1.05 mg/dL (ref 0.76–1.27)
Globulin, Total: 2 g/dL (ref 1.5–4.5)
Glucose: 124 mg/dL — ABNORMAL HIGH (ref 70–99)
Potassium: 3.7 mmol/L (ref 3.5–5.2)
Sodium: 141 mmol/L (ref 134–144)
Total Protein: 6.4 g/dL (ref 6.0–8.5)
eGFR: 75 mL/min/1.73

## 2025-01-07 MED ORDER — LIDOCAINE HCL 1 % IJ SOLN
5.0000 mL | INTRAMUSCULAR | Status: AC | PRN
  Administered 2025-01-06: 5 mL

## 2025-01-07 MED ORDER — BUPIVACAINE HCL 0.25 % IJ SOLN
4.0000 mL | INTRAMUSCULAR | Status: AC | PRN
  Administered 2025-01-06: 4 mL via INTRA_ARTICULAR

## 2025-01-07 MED ORDER — TRIAMCINOLONE ACETONIDE 40 MG/ML IJ SUSP
40.0000 mg | INTRAMUSCULAR | Status: AC | PRN
  Administered 2025-01-06: 40 mg via INTRA_ARTICULAR

## 2025-01-09 DIAGNOSIS — I77810 Thoracic aortic ectasia: Secondary | ICD-10-CM | POA: Insufficient documentation

## 2025-01-09 DIAGNOSIS — I452 Bifascicular block: Secondary | ICD-10-CM | POA: Insufficient documentation

## 2025-01-13 ENCOUNTER — Ambulatory Visit: Admitting: Orthopedic Surgery

## 2025-02-02 ENCOUNTER — Ambulatory Visit (HOSPITAL_COMMUNITY)

## 2025-02-12 ENCOUNTER — Ambulatory Visit (HOSPITAL_COMMUNITY)

## 2025-05-13 ENCOUNTER — Ambulatory Visit: Admitting: Hematology and Oncology

## 2025-05-13 ENCOUNTER — Other Ambulatory Visit
